# Patient Record
Sex: Female | Born: 1970 | Race: Black or African American | Hispanic: No | Marital: Married | Smoking: Former smoker
Health system: Southern US, Community
[De-identification: ages and names within clinical notes are randomized; demographics above are authoritative.]

## PROBLEM LIST (undated history)

## (undated) ENCOUNTER — Inpatient Hospital Stay (HOSPITAL_COMMUNITY): Payer: Medicaid Other

## (undated) DIAGNOSIS — G43909 Migraine, unspecified, not intractable, without status migrainosus: Secondary | ICD-10-CM

## (undated) DIAGNOSIS — K219 Gastro-esophageal reflux disease without esophagitis: Secondary | ICD-10-CM

## (undated) DIAGNOSIS — G4730 Sleep apnea, unspecified: Secondary | ICD-10-CM

## (undated) HISTORY — DX: Sleep apnea, unspecified: G47.30

## (undated) HISTORY — PX: KNEE SURGERY: SHX244

## (undated) HISTORY — DX: Gastro-esophageal reflux disease without esophagitis: K21.9

## (undated) HISTORY — DX: Migraine, unspecified, not intractable, without status migrainosus: G43.909

---

## 2007-01-30 ENCOUNTER — Emergency Department (HOSPITAL_COMMUNITY): Admission: EM | Admit: 2007-01-30 | Discharge: 2007-01-30 | Payer: Self-pay | Admitting: Emergency Medicine

## 2009-09-26 ENCOUNTER — Emergency Department (HOSPITAL_COMMUNITY): Admission: EM | Admit: 2009-09-26 | Discharge: 2009-09-27 | Payer: Self-pay | Admitting: Emergency Medicine

## 2010-02-13 ENCOUNTER — Emergency Department (HOSPITAL_COMMUNITY): Admission: EM | Admit: 2010-02-13 | Discharge: 2010-02-13 | Payer: Self-pay | Admitting: Emergency Medicine

## 2010-06-11 LAB — POCT PREGNANCY, URINE: Preg Test, Ur: NEGATIVE

## 2010-06-11 LAB — GC/CHLAMYDIA PROBE AMP, GENITAL: GC Probe Amp, Genital: NEGATIVE

## 2010-06-11 LAB — WET PREP, GENITAL: Trich, Wet Prep: NONE SEEN

## 2010-09-25 ENCOUNTER — Emergency Department (HOSPITAL_COMMUNITY)
Admission: EM | Admit: 2010-09-25 | Discharge: 2010-09-26 | Disposition: A | Discharge status: Home / Self Care | Payer: Self-pay | Attending: Emergency Medicine | Admitting: Emergency Medicine

## 2010-09-25 DIAGNOSIS — B9689 Other specified bacterial agents as the cause of diseases classified elsewhere: Secondary | ICD-10-CM | POA: Insufficient documentation

## 2010-09-25 DIAGNOSIS — A499 Bacterial infection, unspecified: Secondary | ICD-10-CM | POA: Insufficient documentation

## 2010-09-25 DIAGNOSIS — N760 Acute vaginitis: Secondary | ICD-10-CM | POA: Insufficient documentation

## 2010-09-25 LAB — URINE MICROSCOPIC-ADD ON

## 2010-09-25 LAB — URINALYSIS, ROUTINE W REFLEX MICROSCOPIC
Glucose, UA: NEGATIVE mg/dL
Protein, ur: NEGATIVE mg/dL

## 2010-09-25 LAB — POCT PREGNANCY, URINE: Preg Test, Ur: NEGATIVE

## 2010-09-26 LAB — WET PREP, GENITAL: Yeast Wet Prep HPF POC: NONE SEEN

## 2010-09-27 LAB — GC/CHLAMYDIA PROBE AMP, GENITAL
Chlamydia, DNA Probe: NEGATIVE
GC Probe Amp, Genital: NEGATIVE

## 2011-01-07 LAB — URINALYSIS, ROUTINE W REFLEX MICROSCOPIC
Bilirubin Urine: NEGATIVE
Glucose, UA: NEGATIVE
Ketones, ur: NEGATIVE
Leukocytes, UA: NEGATIVE
Nitrite: NEGATIVE
Protein, ur: NEGATIVE
Specific Gravity, Urine: 1.027
Urobilinogen, UA: 1
pH: 6

## 2011-01-07 LAB — WET PREP, GENITAL: Yeast Wet Prep HPF POC: NONE SEEN

## 2011-01-07 LAB — URINE MICROSCOPIC-ADD ON

## 2011-01-07 LAB — GC/CHLAMYDIA PROBE AMP, GENITAL: GC Probe Amp, Genital: NEGATIVE

## 2011-01-07 LAB — RPR: RPR Ser Ql: NONREACTIVE

## 2011-01-07 LAB — POCT PREGNANCY, URINE
Operator id: 192351
Preg Test, Ur: NEGATIVE

## 2011-02-16 ENCOUNTER — Emergency Department (HOSPITAL_COMMUNITY): Payer: Self-pay

## 2011-02-16 ENCOUNTER — Encounter: Payer: Self-pay | Admitting: *Deleted

## 2011-02-16 ENCOUNTER — Emergency Department (HOSPITAL_COMMUNITY)
Admission: EM | Admit: 2011-02-16 | Discharge: 2011-02-16 | Disposition: A | Discharge status: Home / Self Care | Payer: Self-pay | Attending: Emergency Medicine | Admitting: Emergency Medicine

## 2011-02-16 DIAGNOSIS — M79609 Pain in unspecified limb: Secondary | ICD-10-CM | POA: Insufficient documentation

## 2011-02-16 DIAGNOSIS — IMO0002 Reserved for concepts with insufficient information to code with codable children: Secondary | ICD-10-CM | POA: Insufficient documentation

## 2011-02-16 DIAGNOSIS — S9032XA Contusion of left foot, initial encounter: Secondary | ICD-10-CM

## 2011-02-16 DIAGNOSIS — S9030XA Contusion of unspecified foot, initial encounter: Secondary | ICD-10-CM | POA: Insufficient documentation

## 2011-02-16 DIAGNOSIS — R269 Unspecified abnormalities of gait and mobility: Secondary | ICD-10-CM | POA: Insufficient documentation

## 2011-02-16 MED ORDER — KETOROLAC TROMETHAMINE 60 MG/2ML IM SOLN
60.0000 mg | Freq: Once | INTRAMUSCULAR | Status: AC
  Administered 2011-02-16: 60 mg via INTRAMUSCULAR
  Filled 2011-02-16: qty 2

## 2011-02-16 MED ORDER — NAPROXEN 500 MG PO TABS: 500.0000 mg | ORAL_TABLET | Freq: Two times a day (BID) | ORAL | Status: DC
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
## 2011-07-01 ENCOUNTER — Encounter (HOSPITAL_COMMUNITY): Payer: Self-pay | Admitting: *Deleted

## 2011-07-01 ENCOUNTER — Emergency Department (HOSPITAL_COMMUNITY)
Admission: EM | Admit: 2011-07-01 | Discharge: 2011-07-01 | Disposition: A | Discharge status: Home / Self Care | Payer: Self-pay | Attending: Emergency Medicine | Admitting: Emergency Medicine

## 2011-07-01 ENCOUNTER — Other Ambulatory Visit: Payer: Self-pay

## 2011-07-01 DIAGNOSIS — R42 Dizziness and giddiness: Secondary | ICD-10-CM | POA: Insufficient documentation

## 2011-07-01 DIAGNOSIS — N390 Urinary tract infection, site not specified: Secondary | ICD-10-CM | POA: Insufficient documentation

## 2011-07-01 DIAGNOSIS — F17200 Nicotine dependence, unspecified, uncomplicated: Secondary | ICD-10-CM | POA: Insufficient documentation

## 2011-07-01 LAB — URINALYSIS, ROUTINE W REFLEX MICROSCOPIC
Bilirubin Urine: NEGATIVE
Glucose, UA: NEGATIVE mg/dL
Ketones, ur: NEGATIVE mg/dL
Leukocytes, UA: NEGATIVE
Nitrite: POSITIVE — AB
Protein, ur: NEGATIVE mg/dL
Specific Gravity, Urine: 1.026 (ref 1.005–1.030)
Urobilinogen, UA: 1 mg/dL (ref 0.0–1.0)
pH: 6 (ref 5.0–8.0)

## 2011-07-01 LAB — URINE MICROSCOPIC-ADD ON

## 2011-07-01 MED ORDER — SULFAMETHOXAZOLE-TRIMETHOPRIM 800-160 MG PO TABS: 1.0000 | ORAL_TABLET | Freq: Two times a day (BID) | ORAL | Status: AC

## 2011-07-01 MED ORDER — SULFAMETHOXAZOLE-TMP DS 800-160 MG PO TABS
1.0000 | ORAL_TABLET | Freq: Once | ORAL | Status: AC
  Administered 2011-07-01: 1 via ORAL
  Filled 2011-07-01: qty 1

## 2011-07-01 NOTE — ED Notes (Signed)
Pt c/o dizziness since Sunday.  No other symptoms except for a bad odor  In her urine.  lmp   2 weeks ago

## 2011-07-01 NOTE — ED Provider Notes (Signed)
History    40yf with dizziness. Vague feeling of that just doesn't feel like normal self. NO fever or chills. NO HA, CP or SOB. No palpitations. Urinary frequency and smells bad. No vag bleeding or discharge. No back pain. No n/v/d. Denies drug use. Smoker. Denies new meds or otcs.  CSN: [id number]  Arrival date & time 07/01/11  2057   First MD Initiated Contact with Patient 07/01/11 2302      Chief Complaint  Patient presents with  . Dizziness    (Consider location/radiation/quality/duration/timing/severity/associated sxs/prior treatment) HPI  History reviewed. No pertinent past medical history.  Past Surgical History  Procedure Date  . Knee surgery     No family history on file.  History  Substance Use Topics  . Smoking status: Current Everyday Smoker  . Smokeless tobacco: Not on file  . Alcohol Use: Yes     occaisional     OB History    Grav Para Term Preterm Abortions TAB SAB Ect Mult Living                  Review of Systems   Review of symptoms negative unless otherwise noted in HPI.   Allergies  Review of patient's allergies indicates no known allergies.  Home Medications   Current Outpatient Rx  Name Route Sig Dispense Refill  . ASPIRIN 325 MG PO TABS Oral Take 650 mg by mouth every 6 (six) hours as needed. For pain    . IBUPROFEN 200 MG PO TABS Oral Take 400 mg by mouth every 6 (six) hours as needed. For pain    . SULFAMETHOXAZOLE-TRIMETHOPRIM 800-160 MG PO TABS Oral Take 1 tablet by mouth every 12 (twelve) hours. 8 tablet 0    BP 115/71  Pulse 88  Temp(Src) 97.9 F (36.6 C) (Oral)  Resp 18  SpO2 99%  LMP 06/17/2011  Physical Exam  Nursing note and vitals reviewed. Constitutional: She appears well-developed and well-nourished. No distress.       Sitting up in bed. nad.  HENT:  Head: Normocephalic and atraumatic.  Eyes: Conjunctivae are normal. Right eye exhibits no discharge. Left eye exhibits no discharge.  Neck: Neck supple.    Cardiovascular: Normal rate, regular rhythm and normal heart sounds.  Exam reveals no gallop and no friction rub.   No murmur heard. Pulmonary/Chest: Effort normal and breath sounds normal. No respiratory distress.  Abdominal: Soft. She exhibits no distension. There is no tenderness.  Genitourinary:       No cva tenderness.  Musculoskeletal: She exhibits no edema and no tenderness.  Neurological: She is alert.  Skin: Skin is warm and dry.  Psychiatric: She has a normal mood and affect. Her behavior is normal. Thought content normal.    ED Course  Procedures (including critical care time)  Labs Reviewed  URINALYSIS, ROUTINE W REFLEX MICROSCOPIC - Abnormal; Notable for the following:    APPearance CLOUDY (*)    Hgb urine dipstick MODERATE (*)    Nitrite POSITIVE (*)    All other components within normal limits  URINE MICROSCOPIC-ADD ON - Abnormal; Notable for the following:    Squamous Epithelial / LPF FEW (*)    Bacteria, UA MANY (*)    All other components within normal limits  POCT PREGNANCY, URINE   No results found.  EKG:  Rhythm: Normal sinus Rate: 84 Axis: Normal axis Intervals: Poor R wave progression ST segments: Nonspecific ST changes. There is T-wave flattening anteriorly. Comparison: None provided  1. Dizziness  2. UTI (urinary tract infection)       MDM  40yf with dizziness. UA consistent with UTI and suspect symptoms related to that. Afebrile, normal HR and well appearing. Doubt urosepsis. Feel appropriate for outpt tx. EKG with normal intervals and no ischemic changes. Return precautions discussed.        Raeford Razor, MD 07/03/11 2337

## 2011-07-01 NOTE — Discharge Instructions (Signed)
Dizziness  Dizziness is a common problem. It is a feeling of unsteadiness or lightheadedness. You may feel like you are about to faint. Dizziness can lead to injury if you stumble or fall. A person of any age group can suffer from dizziness, but dizziness is more common in older adults.  CAUSES   Dizziness can be caused by many different things, including:  · Middle ear problems.  · Standing for too long.  · Infections.  · An allergic reaction.  · Aging.  · An emotional response to something, such as the sight of blood.  · Side effects of medicines.  · Fatigue.  · Problems with circulation or blood pressure.  · Excess use of alcohol, medicines, or illegal drug use.  · Breathing too fast (hyperventilation).  · An arrhythmia or problems with your heart rhythm.  · Low red blood cell count (anemia).  · Pregnancy.  · Vomiting, diarrhea, fever, or other illnesses that cause dehydration.  · Diseases or conditions such as Parkinson's disease, high blood pressure (hypertension), diabetes, and thyroid problems.  · Exposure to extreme heat.  DIAGNOSIS   To find the cause of your dizziness, your caregiver may do a physical exam, lab tests, radiologic imaging scans, or an electrocardiography test (ECG).   TREATMENT   Treatment of dizziness depends on the cause of your symptoms and can vary greatly.  HOME CARE INSTRUCTIONS   · Drink enough fluids to keep your urine clear or pale yellow. This is especially important in very hot weather. In the elderly, it is also important in cold weather.  · If your dizziness is caused by medicines, take them exactly as directed. When taking blood pressure medicines, it is especially important to get up slowly.  · Rise slowly from chairs and steady yourself until you feel okay.  · In the morning, first sit up on the side of the bed. When this seems okay, stand slowly while holding onto something until you know your balance is fine.  · If you need to stand in one place for a long time, be sure to  move your legs often. Tighten and relax the muscles in your legs while standing.  · If dizziness continues to be a problem, have someone stay with you for a day or two. Do this until you feel you are well enough to stay alone. Have the person call your caregiver if he or she notices changes in you that are concerning.  · Do not drive or use heavy machinery if you feel dizzy.  SEEK IMMEDIATE MEDICAL CARE IF:   · Your dizziness or lightheadedness gets worse.  · You feel nauseous or vomit.  · You develop problems with talking, walking, weakness, or using your arms, hands, or legs.  · You are not thinking clearly or you have difficulty forming sentences. It may take a friend or family member to determine if your thinking is normal.  · You develop chest pain, abdominal pain, shortness of breath, or sweating.  · Your vision changes.  · You notice any bleeding.  · You have side effects from medicine that seems to be getting worse rather than better.  MAKE SURE YOU:   · Understand these instructions.  · Will watch your condition.  · Will get help right away if you are not doing well or get worse.  Document Released: 09/10/2000 Document Revised: 03/06/2011 Document Reviewed: 10/04/2010  ExitCare® Patient Information ©2012 [employer].  Urinary Tract Infection  Infections of the   and a prostate infection (prostatitis) are different types of urinary tract infections (UTIs). They usually get better if treated with medicines (antibiotics) that kill germs. Take all the medicine until it is gone. You or your child may feel better in a few days, but TAKE ALL MEDICINE or the infection may not respond and may become more difficult to treat. HOME CARE INSTRUCTIONS   Drink enough water and fluids to keep the urine clear or pale yellow. Cranberry juice is especially recommended, in  addition to large amounts of water.   Avoid caffeine, tea, and carbonated beverages. They tend to irritate the bladder.   Alcohol may irritate the prostate.   Only take over-the-counter or prescription medicines for pain, discomfort, or fever as directed by your caregiver.  To prevent further infections:  Empty the bladder often. Avoid holding urine for long periods of time.   After a bowel movement, women should cleanse from front to back. Use each tissue only once.   Empty the bladder before and after sexual intercourse.  FINDING OUT THE RESULTS OF YOUR TEST Not all test results are available during your visit. If your or your child's test results are not back during the visit, make an appointment with your caregiver to find out the results. Do not assume everything is normal if you have not heard from your caregiver or the medical facility. It is important for you to follow up on all test results. SEEK MEDICAL CARE IF:   There is back pain.   Your baby is older than 3 months with a rectal temperature of 100.5 F (38.1 C) or higher for more than 1 day.   Your or your child's problems (symptoms) are no better in 3 days. Return sooner if you or your child is getting worse.  SEEK IMMEDIATE MEDICAL CARE IF:   There is severe back pain or lower abdominal pain.   You or your child develops chills.   You have a fever.   Your baby is older than 3 months with a rectal temperature of 102 F (38.9 C) or higher.   Your baby is 35 months old or younger with a rectal temperature of 100.4 F (38 C) or higher.   There is nausea or vomiting.   There is continued burning or discomfort with urination.  MAKE SURE YOU:   Understand these instructions.   Will watch your condition.   Will get help right away if you are not doing well or get worse.  Document Released: 12/25/2004 Document Revised: 03/06/2011 Document Reviewed: 07/30/2006 [facility] Patient Information 2012 [place],  [place].  RESOURCE GUIDE  Dental Problems  Patients with Medicaid: [facility] [phone] [address]                                           [phone] [address] Phone:  [phone]                                                  Phone:  [phone]  If unable to pay or uninsured, contact:  [facility] or [facility].  to become qualified for the adult dental clinic.  Chronic Pain Problems Contact [facility]  [phone] Patients need to be referred by their primary care doctor.  Insufficient Money for Medicine Contact [employer]:  call "211" or [facility] 9495799765.  No Primary Care Doctor Call [facility]  [phone] Other agencies that provide inexpensive medical care    [facility]  [phone]    [facility]  [phone]    [facility]  [phone]    [facility]  [phone]    [facility]  601-194-1859    [facility]  [phone]  Psychological Services [facility]  [phone] [facility] Services  [phone] [facility]   [phone] (emergency services [phone])  Substance Abuse Resources [facility]  [phone] [facility] 563-386-2538 [facility] [place] [phone] Floydene Flock [phone] Residential & Outpatient Substance Abuse Program  440-635-3033  Abuse/Neglect [facility] Child Abuse Hotline (670)022-1854 [facility] Child Abuse Hotline 479-573-1582 (After Hours)  Emergency Shelter [facility] 319-649-4306  Maternity Homes [facility] [place] [facility] (619)273-4114 [facility] [phone]  MRSA Hotline #:   571-012-3837    [facility] Resources  [facility]     [employer]                          [facility] [address] [place]                       [address]      [address]  Blondell Reveal Phone:  [phone]                                   Phone:  [phone]                 Phone:  [phone]  [facility] Phone:  [phone]  [facility] Child Abuse Hotline 5485472366 [phone] (After Hours)

## 2011-07-01 NOTE — ED Notes (Signed)
Pt denies any pain or questions upon discharge. 

## 2011-07-01 NOTE — ED Notes (Signed)
Pt st's she has felt dizzy off and on since Sun.  St's she also has noticed that her urine has a foul smell.  Pt denies any vag. Discharge or pain.  Pt alert and oriented x's 3.  Talking on cell phone.

## 2011-10-18 ENCOUNTER — Encounter (HOSPITAL_COMMUNITY): Payer: Self-pay | Admitting: Emergency Medicine

## 2011-10-18 ENCOUNTER — Emergency Department (HOSPITAL_COMMUNITY)
Admission: EM | Admit: 2011-10-18 | Discharge: 2011-10-19 | Disposition: A | Discharge status: Home / Self Care | Payer: Self-pay | Attending: Emergency Medicine | Admitting: Emergency Medicine

## 2011-10-18 DIAGNOSIS — F17200 Nicotine dependence, unspecified, uncomplicated: Secondary | ICD-10-CM | POA: Insufficient documentation

## 2011-10-18 DIAGNOSIS — M1990 Unspecified osteoarthritis, unspecified site: Secondary | ICD-10-CM

## 2011-10-18 DIAGNOSIS — M19049 Primary osteoarthritis, unspecified hand: Secondary | ICD-10-CM | POA: Insufficient documentation

## 2011-10-18 NOTE — ED Notes (Signed)
Patient complaining of right index finger pain for the past three months; patient states that she fell three years ago and injured her finger.  Denies any recent injury to finger.  Patient able to move digits freely without difficulty.

## 2011-10-19 ENCOUNTER — Emergency Department (HOSPITAL_COMMUNITY): Payer: Self-pay

## 2011-10-19 MED ORDER — NAPROXEN 500 MG PO TABS: 500.0000 mg | ORAL_TABLET | Freq: Two times a day (BID) | ORAL | Status: DC

## 2011-10-19 NOTE — ED Provider Notes (Signed)
History     CSN: [id number]  Arrival date & time 10/18/11  2209   First MD Initiated Contact with Patient 10/19/11 0022      Chief Complaint  Patient presents with  . Hand Pain  . Finger Injury   HPI  History provided by the patient. Patient is a 41 year old female who presents with complaints of right index finger pain. Patient states that she fell and injured her finger several years ago and at that time had some swelling but reports this improved with rest and ice. Since that time she has occasional pain to the PIP joint. This has especially seems worse over the past 3 months. Pain is also aggravated with long periods of writing with pen or pencil. Patient has tried use ice and rest again this has not seemed to help significantly. She has not taken any medications for symptoms. She denies any other aggravating or alleviating factors. She denies any decreased range of motion or change in sensation.    History reviewed. No pertinent past medical history.  Past Surgical History  Procedure Date  . Knee surgery     History reviewed. No pertinent family history.  History  Substance Use Topics  . Smoking status: Current Everyday Smoker  . Smokeless tobacco: Not on file  . Alcohol Use: Yes     occaisional     OB History    Grav Para Term Preterm Abortions TAB SAB Ect Mult Living                  Review of Systems  Musculoskeletal:       Pain and swelling of right second PIP joint of hand  Skin: Negative for rash.       No erythema  Neurological: Negative for weakness and numbness.    Allergies  Review of patient's allergies indicates no known allergies.  Home Medications  No current outpatient prescriptions on file.  BP 117/70  Pulse 95  Temp 98.8 F (37.1 C) (Oral)  Resp 18  SpO2 98%  LMP 09/28/2011  Physical Exam  Nursing note and vitals reviewed. Constitutional: She is oriented to person, place, and time. She appears well-developed and well-nourished.  No distress.  HENT:  Head: Normocephalic.  Cardiovascular: Normal rate and regular rhythm.   Pulmonary/Chest: Effort normal and breath sounds normal.  Musculoskeletal:       Tenderness palpation over right second PIP joint of hand. Full range of motion. Normal strength with flexion and extension. Normal strength with abduction and adduction. Normal medial and lateral sensations. Normal cap refill less than 2 seconds. No pain over the distal phalanx. No gross deformities or swelling.  Neurological: She is alert and oriented to person, place, and time.  Skin: Skin is warm and dry. No rash noted. No erythema.  Psychiatric: She has a normal mood and affect. Her behavior is normal.    ED Course  Procedures   Dg Finger Index Right  10/19/2011  *RADIOLOGY REPORT*  Clinical Data: Finger injury and pain.  RIGHT INDEX FINGER 2+V  Comparison: None.  Findings: Linear defect in the distal phalanx of the right second fingers suggest nondisplaced phalangeal tuft fracture.  Right second finger appears otherwise intact.  Mild distal soft tissue swelling.  No focal bone lesion or bone destruction.  No abnormal radiopaque soft tissue densities.  IMPRESSION: Suggestion of nondisplaced distal phalangeal tuft fracture of the right second finger.  Original Report Authenticated By: Marlon Pel, M.D.     1.  Arthritis       MDM  Patient seen and evaluated x-rays without signs for acute injury. Patient has no pain over the distal phalanx. Pain is located at the PIP joint. At this time suspect arthritis symptoms of the joint. Patient instructed on diagnosis and treatment plan and agrees with this.        Angus Seller, [place] 10/19/11 [phone]

## 2011-10-19 NOTE — ED Provider Notes (Signed)
Medical screening examination/treatment/procedure(s) were performed by non-physician practitioner and as supervising physician I was immediately available for consultation/collaboration.  Bresha Hosack, MD 10/19/11 0723 

## 2012-03-30 ENCOUNTER — Emergency Department (HOSPITAL_COMMUNITY)
Admission: EM | Admit: 2012-03-30 | Discharge: 2012-03-30 | Disposition: A | Discharge status: Home / Self Care | Payer: Self-pay | Attending: Emergency Medicine | Admitting: Emergency Medicine

## 2012-03-30 ENCOUNTER — Emergency Department (HOSPITAL_COMMUNITY): Payer: Self-pay

## 2012-03-30 ENCOUNTER — Encounter (HOSPITAL_COMMUNITY): Payer: Self-pay | Admitting: Emergency Medicine

## 2012-03-30 DIAGNOSIS — F17200 Nicotine dependence, unspecified, uncomplicated: Secondary | ICD-10-CM | POA: Insufficient documentation

## 2012-03-30 DIAGNOSIS — Z3202 Encounter for pregnancy test, result negative: Secondary | ICD-10-CM | POA: Insufficient documentation

## 2012-03-30 DIAGNOSIS — M25732 Osteophyte, left wrist: Secondary | ICD-10-CM

## 2012-03-30 DIAGNOSIS — N898 Other specified noninflammatory disorders of vagina: Secondary | ICD-10-CM | POA: Insufficient documentation

## 2012-03-30 DIAGNOSIS — M778 Other enthesopathies, not elsewhere classified: Secondary | ICD-10-CM | POA: Insufficient documentation

## 2012-03-30 LAB — URINALYSIS, ROUTINE W REFLEX MICROSCOPIC
Bilirubin Urine: NEGATIVE
Glucose, UA: NEGATIVE mg/dL
Hgb urine dipstick: NEGATIVE
Ketones, ur: NEGATIVE mg/dL
Nitrite: NEGATIVE
Specific Gravity, Urine: 1.025 (ref 1.005–1.030)
pH: 5.5 (ref 5.0–8.0)

## 2012-03-30 LAB — URINE MICROSCOPIC-ADD ON

## 2012-03-30 LAB — WET PREP, GENITAL
WBC, Wet Prep HPF POC: NONE SEEN
Yeast Wet Prep HPF POC: NONE SEEN

## 2012-03-30 MED ORDER — CEFTRIAXONE SODIUM 250 MG IJ SOLR
250.0000 mg | Freq: Once | INTRAMUSCULAR | Status: AC
  Administered 2012-03-30: 250 mg via INTRAMUSCULAR
  Filled 2012-03-30: qty 250

## 2012-03-30 MED ORDER — IBUPROFEN 800 MG PO TABS: 800.0000 mg | ORAL_TABLET | Freq: Three times a day (TID) | ORAL | Status: DC

## 2012-03-30 MED ORDER — KETOROLAC TROMETHAMINE 60 MG/2ML IM SOLN
60.0000 mg | Freq: Once | INTRAMUSCULAR | Status: AC
  Administered 2012-03-30: 60 mg via INTRAMUSCULAR
  Filled 2012-03-30: qty 2

## 2012-03-30 MED ORDER — FLUCONAZOLE 100 MG PO TABS
150.0000 mg | ORAL_TABLET | Freq: Once | ORAL | Status: AC
  Administered 2012-03-30: 150 mg via ORAL
  Filled 2012-03-30: qty 2

## 2012-03-30 MED ORDER — AZITHROMYCIN 250 MG PO TABS
1000.0000 mg | ORAL_TABLET | Freq: Once | ORAL | Status: AC
  Administered 2012-03-30: 1000 mg via ORAL
  Filled 2012-03-30: qty 4

## 2012-03-30 NOTE — ED Notes (Signed)
Patient transported to X-ray 

## 2012-03-30 NOTE — Progress Notes (Signed)
Orthopedic Tech Progress Note Patient Details:  Wendy Howard Dec 18, 1970 [id number]  Ortho Devices Type of Ortho Device: Wrist splint Ortho Device/Splint Location: LEFT WRIST SPLINT Ortho Device/Splint Interventions: Application   Cammer, Mickie Bail 03/30/2012, 4:37 AM

## 2012-03-30 NOTE — ED Provider Notes (Signed)
History     CSN: [id number]  Arrival date & time 03/30/12  0033   First MD Initiated Contact with Patient 03/30/12 [phone]      Chief Complaint  Patient presents with  . Vaginal Discharge    (Consider location/radiation/quality/duration/timing/severity/associated sxs/prior treatment) HPI History provided by patient. She noticed over the last few days last few days a bump on her left wrist, is sore to touch. No injury. No redness or fevers. 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 No history of ganglion cyst. No weakness or numbness.  She also has vaginal itching and discharge. This has been going on for about a week. No vaginal bleeding. No back pain. No nausea vomiting or diarrhea. No abdominal pain. Symptoms moderate in severity. Has history of gonorrhea and is sexually active. No rash or lesions.  History reviewed. No pertinent past medical history.  Past Surgical History  Procedure Date  . Knee surgery   . Knee surgery     No family history on file.  History  Substance Use Topics  . Smoking status: Current Every Day Smoker  . Smokeless tobacco: Not on file  . Alcohol Use: Yes     Comment: occaisional     OB History    Grav Para Term Preterm Abortions TAB SAB Ect Mult Living                  Review of Systems  Constitutional: Negative for fever and chills.  HENT: Negative for neck pain and neck stiffness.   Eyes: Negative for pain.  Respiratory: Negative for shortness of breath.   Cardiovascular: Negative for chest pain.  Gastrointestinal: Negative for abdominal pain.  Genitourinary: Positive for vaginal discharge. Negative for dysuria, genital sores and pelvic pain.  Musculoskeletal: Negative for back pain.  Skin: Negative for rash.  Neurological: Negative for headaches.  All other systems reviewed and are negative.    Allergies  Other  Home Medications   Current Outpatient Rx  Name  Route  Sig  Dispense  Refill  .  IBUPROFEN 200 MG PO TABS   Oral   Take 800 mg by mouth every 6 (six) hours as needed.           BP 120/73  Pulse 95  Temp 99 F (37.2 C) (Oral)  Resp 14  SpO2 100%  LMP 03/06/2012  Physical Exam  Constitutional: She is oriented to person, place, and time. She appears well-developed and well-nourished.  HENT:  Head: Normocephalic and atraumatic.  Eyes: EOM are normal. Pupils are equal, round, and reactive to light.  Neck: Neck supple.  Cardiovascular: Regular rhythm and intact distal pulses.   Pulmonary/Chest: Effort normal. No respiratory distress.  Abdominal: Soft. Bowel sounds are normal. She exhibits no distension. There is no tenderness.  Genitourinary: Vagina normal.       No adnexal or cervical motion tenderness. Moderate white discharge. No GU lesions or rash  Musculoskeletal: Normal range of motion. She exhibits no edema.        Left dorsal wrist with small area of dorsal swelling. Mildly tender to palpation. No fluctuance. Full range of motion throughout with distal neurovascular intact. Right wrist with similar lesion.  Neurological: She is alert and oriented to person, place, and time.  Skin: Skin is warm and dry.    ED Course  Procedures (including critical care time)  Results for orders placed during the hospital encounter of 03/30/12  URINALYSIS, ROUTINE  W REFLEX MICROSCOPIC      Component Value Range   Color, Urine YELLOW  YELLOW   APPearance CLOUDY (*) CLEAR   Specific Gravity, Urine 1.025  1.005 - 1.030   pH 5.5  5.0 - 8.0   Glucose, UA NEGATIVE  NEGATIVE mg/dL   Hgb urine dipstick NEGATIVE  NEGATIVE   Bilirubin Urine NEGATIVE  NEGATIVE   Ketones, ur NEGATIVE  NEGATIVE mg/dL   Protein, ur NEGATIVE  NEGATIVE mg/dL   Urobilinogen, UA 0.2  0.0 - 1.0 mg/dL   Nitrite NEGATIVE  NEGATIVE   Leukocytes, UA SMALL (*) NEGATIVE  POCT PREGNANCY, URINE      Component Value Range   Preg Test, Ur NEGATIVE  NEGATIVE  URINE MICROSCOPIC-ADD ON      Component Value  Range   Squamous Epithelial / LPF FEW (*) RARE   WBC, UA 3-6  <3 WBC/hpf   RBC / HPF 0-2  <3 RBC/hpf   Bacteria, UA RARE  RARE   Urine-Other MUCOUS PRESENT    WET PREP, GENITAL      Component Value Range   Yeast Wet Prep HPF POC NONE SEEN  NONE SEEN   Trich, Wet Prep NONE SEEN  NONE SEEN   Clue Cells Wet Prep HPF POC RARE (*) NONE SEEN   WBC, Wet Prep HPF POC NONE SEEN  NONE SEEN   Dg Wrist Complete Left  03/30/2012  *RADIOLOGY REPORT*  Clinical Data: Painful knot on posterior left wrist for 1 week.  LEFT WRIST - COMPLETE 3+ VIEW  Comparison: Left hand radiographs performed 09/26/2009  Findings: There is no evidence of fracture or dislocation.  The carpal rows are intact, and demonstrate normal alignment.  The joint spaces are preserved.  There is mild osteophyte formation at the distal ulna.  Mild soft tissue swelling is suggested at the dorsal aspect of the wrist.  IMPRESSION: No evidence of fracture or dislocation.   Original Report Authenticated By: Tonia Ghent, M.D.     IM Toradol, Rocephin provided. Azithromycin.  On recheck symptoms improving. Plan discharge with hand referral and referral to the health department for followup culture results.   MDM   Vaginal discharge and itching - given Diflucan. Has history of STD and treated for the same. Cultures pending  Left wrist imaging reviewed as above - Hand referral  Vital signs and nursing notes reviewed. Improved with pain medications as above      Sunnie Nielsen, MD 03/30/12 [phone]

## 2012-03-30 NOTE — ED Notes (Signed)
PT. REPORTS VAGINAL DISCHARGE /ITCHING FOR 1 WEEK , DENIES DYSURIA , ALSO REPORTS "KNOT" AT LEFT POSTERIOR WRIST DENIES INJURY.

## 2012-03-31 LAB — GC/CHLAMYDIA PROBE AMP: CT Probe RNA: NEGATIVE

## 2012-07-25 ENCOUNTER — Encounter (HOSPITAL_COMMUNITY): Payer: Self-pay | Admitting: Emergency Medicine

## 2012-07-25 ENCOUNTER — Emergency Department (HOSPITAL_COMMUNITY)
Admission: EM | Admit: 2012-07-25 | Discharge: 2012-07-25 | Disposition: A | Discharge status: Home Health | Payer: No Typology Code available for payment source | Attending: Emergency Medicine | Admitting: Emergency Medicine

## 2012-07-25 DIAGNOSIS — J020 Streptococcal pharyngitis: Secondary | ICD-10-CM | POA: Insufficient documentation

## 2012-07-25 DIAGNOSIS — F17200 Nicotine dependence, unspecified, uncomplicated: Secondary | ICD-10-CM | POA: Insufficient documentation

## 2012-07-25 DIAGNOSIS — R490 Dysphonia: Secondary | ICD-10-CM | POA: Insufficient documentation

## 2012-07-25 DIAGNOSIS — H9209 Otalgia, unspecified ear: Secondary | ICD-10-CM | POA: Insufficient documentation

## 2012-07-25 LAB — RAPID STREP SCREEN (MED CTR MEBANE ONLY): Streptococcus, Group A Screen (Direct): POSITIVE — AB

## 2012-07-25 MED ORDER — DEXAMETHASONE SODIUM PHOSPHATE 10 MG/ML IJ SOLN
10.0000 mg | Freq: Once | INTRAMUSCULAR | Status: AC
  Administered 2012-07-25: 10 mg via INTRAMUSCULAR
  Filled 2012-07-25: qty 1

## 2012-07-25 MED ORDER — LIDOCAINE VISCOUS 2 % MT SOLN: 20.0000 mL | OROMUCOSAL | Status: DC | PRN

## 2012-07-25 MED ORDER — AMOXICILLIN 500 MG PO CAPS: 500.0000 mg | ORAL_CAPSULE | Freq: Three times a day (TID) | ORAL | Status: DC

## 2012-07-25 MED ORDER — KETOROLAC TROMETHAMINE 60 MG/2ML IM SOLN
60.0000 mg | Freq: Once | INTRAMUSCULAR | Status: AC
  Administered 2012-07-25: 60 mg via INTRAMUSCULAR
  Filled 2012-07-25: qty 2

## 2012-07-25 NOTE — ED Notes (Signed)
Pt discharged to home with family. NAD.  

## 2012-07-25 NOTE — ED Provider Notes (Signed)
History     CSN: [id number]  Arrival date & time 07/25/12  5621   First MD Initiated Contact with Patient 07/25/12 [phone]      Chief Complaint  Patient presents with  . Sore Throat    (Consider location/radiation/quality/duration/timing/severity/associated sxs/prior treatment) HPI  42 y.o. female with sore throat, myalgias, swollen glands,  Pain with swalloning for 2 days. History of previous strep infections Other symptoms: sore throat, swollen glands, white spots in throat, hoarseness, enlarged tonsils and left sided otalgia. Denies fevers, chills. Denies DOE, SOB, chest tightness or pressure, radiation to left arm, jaw or back, or diaphoresis. Denies dysuria, flank pain, suprapubic pain, frequency, urgency, or hematuria. Denies headaches, light headedness, weakness, visual disturbances. Denies abdominal pain, nausea, vomiting, diarrhea or constipation. Denies difficulty difficulty breathing or      History reviewed. No pertinent past medical history.  Past Surgical History  Procedure Laterality Date  . Knee surgery    . Knee surgery    . Knee surgery      History reviewed. No pertinent family history.  History  Substance Use Topics  . Smoking status: Current Every Day Smoker    Types: Cigarettes  . Smokeless tobacco: Not on file  . Alcohol Use: Yes     Comment: occaisional     OB History   Grav Para Term Preterm Abortions TAB SAB Ect Mult Living                  Review of Systems As stated in HPI.  Allergies  Other  Home Medications   Current Outpatient Rx  Name  Route  Sig  Dispense  Refill  . ibuprofen (ADVIL,MOTRIN) 200 MG tablet   Oral   Take 800 mg by mouth every 6 (six) hours as needed.         Marland Kitchen ibuprofen (ADVIL,MOTRIN) 800 MG tablet   Oral   Take 1 tablet (800 mg total) by mouth 3 (three) times daily.   21 tablet   0     BP 121/78  Pulse 86  Temp(Src) 98.7 F (37.1 C) (Oral)  Resp 14  Ht 5\' 2"  (1.575 m)  Wt 145 lb (65.772 kg)   BMI 26.51 kg/m2  SpO2 99%  Physical Exam Vitals as noted above. Appears appears glassy eyed and ill. Ears: bilateral TM's and external ear canals normal Oropharynx: erythematous, exudate noted, tonsils hypertrophied with exudate, dental hygiene good, tongue normal and Uvula midline but edematous. Neck: bilateral symmetric anterior adenopathy, TTP Lungs: clear to auscultation, no wheezes, rales or rhonchi, symmetric air entry Abd: soft, non tender, non-distended. BS normal   ED Course  Procedures (including critical care time)  Labs Reviewed  RAPID STREP SCREEN - Abnormal; Notable for the following:    Streptococcus, Group A Screen (Direct) POSITIVE (*)    All other components within normal limits   No results found.   1. Strep pharyngitis       MDM  8:19 AM Patient with edematous, erythematous throat and exudates. Airway is patent. I have ordered dexamethasone and Toradol. Patient is able to tolerate liquids. Rapid strep is pending.   9:11 AM Pt febrile with tonsillar exudate, cervical lymphadenopathy, & dysphagia; diagnosis of strep. Treated in the Ed with steroids, NSAIDs, Pain medication and PCN IM.  Pt appears mildly dehydrated, discussed importance of water rehydration. Presentation non concerning for PTA or infxn spread to soft tissue. No trismus or uvula deviation. Specific return precautions discussed. Pt able to drink water in  ED without difficulty with intact air way. Recommended PCP follow up.          Arthor Captain, PA-C 07/25/12 [phone]

## 2012-07-25 NOTE — ED Provider Notes (Signed)
Medical screening examination/treatment/procedure(s) were performed by non-physician practitioner and as supervising physician I was immediately available for consultation/collaboration.  Juliet Rude. Rubin Payor, MD 07/25/12 [phone]

## 2012-07-25 NOTE — ED Notes (Signed)
Pt presents to ED vie POV with c/o sore throat that started yesterday. Pt reports pain when swallowing. NAD.

## 2012-08-03 ENCOUNTER — Emergency Department (HOSPITAL_COMMUNITY)
Admission: EM | Admit: 2012-08-03 | Discharge: 2012-08-03 | Disposition: A | Discharge status: Home / Self Care | Payer: No Typology Code available for payment source | Attending: Emergency Medicine | Admitting: Emergency Medicine

## 2012-08-03 DIAGNOSIS — F17200 Nicotine dependence, unspecified, uncomplicated: Secondary | ICD-10-CM | POA: Insufficient documentation

## 2012-08-03 DIAGNOSIS — B373 Candidiasis of vulva and vagina: Secondary | ICD-10-CM | POA: Insufficient documentation

## 2012-08-03 DIAGNOSIS — B3731 Acute candidiasis of vulva and vagina: Secondary | ICD-10-CM | POA: Insufficient documentation

## 2012-08-03 DIAGNOSIS — N898 Other specified noninflammatory disorders of vagina: Secondary | ICD-10-CM | POA: Insufficient documentation

## 2012-08-03 LAB — WET PREP, GENITAL

## 2012-08-03 MED ORDER — FLUCONAZOLE 150 MG PO TABS: ORAL_TABLET | ORAL | Status: DC

## 2012-08-03 NOTE — ED Notes (Signed)
Per pt she finished a dose of Penicillin for Strep throat on 07/30/2012 and yesterday she reports having severe vaginal itching but no discharge.  Denies pain with urination.

## 2012-08-03 NOTE — ED Provider Notes (Signed)
History     CSN: [id number]  Arrival date & time 08/03/12  0744   First MD Initiated Contact with Patient 08/03/12 0802      Chief Complaint  Patient presents with  . Vaginal Itching    (Consider location/radiation/quality/duration/timing/severity/associated sxs/prior treatment) HPI Comments: Pt is a 42 y.o. Female with 1 day of vaginal discharge.  Just finished course of ABX for Strep Pharyngitis.  Reported vaginal itching that is constant starting yesterday.  Worse following urination but no dysuria.  No systemic symptoms.  Does report having recurrent yeast infections with prior antibiotic use but has been multiple years.  Patient is a 42 y.o. female presenting with vaginal itching. The history is provided by the patient.  Vaginal Itching This is a new problem. The current episode started yesterday. The problem occurs constantly. The problem has been unchanged. Pertinent negatives include no abdominal pain, anorexia, chest pain, chills, coughing, fatigue, fever, headaches, nausea, urinary symptoms (increase itching following urination but no dysuria) or vomiting. Exacerbated by: wiping following urination. She has tried nothing for the symptoms.    No past medical history on file.  Past Surgical History  Procedure Laterality Date  . Knee surgery    . Knee surgery    . Knee surgery      No family history on file.  History  Substance Use Topics  . Smoking status: Current Every Day Smoker    Types: Cigarettes  . Smokeless tobacco: Not on file  . Alcohol Use: Yes     Comment: occaisional     OB History   Grav Para Term Preterm Abortions TAB SAB Ect Mult Living                  Review of Systems  Constitutional: Negative for fever, chills and fatigue.  Eyes: Negative.   Respiratory: Negative.  Negative for cough, shortness of breath and wheezing.        Current one pack per day smoker.  Cardiovascular: Negative for chest pain and palpitations.  Gastrointestinal:  Negative for nausea, vomiting, abdominal pain and anorexia.  Endocrine: Negative.   Genitourinary: Negative for dysuria, urgency, frequency, flank pain, decreased urine volume, vaginal bleeding, vaginal discharge, difficulty urinating, vaginal pain, menstrual problem (LMP approximately 2 weeks ago) and pelvic pain.       Does report unprotected intercourse with multiple partners.  No history of STI  Musculoskeletal: Negative.   Skin: Negative.   Allergic/Immunologic: Negative.   Neurological: Negative for headaches.  Hematological: Negative.   Psychiatric/Behavioral: Negative.     Allergies  Other  Home Medications   Current Outpatient Rx  Name  Route  Sig  Dispense  Refill  . fluconazole (DIFLUCAN) 150 MG tablet      Take 1 tablet (150mg ) today.  Take an additional 1 tablet (150mg ) in 3 days   2 tablet   0     BP 123/76  Temp(Src) 98.6 F (37 C) (Oral)  Resp 18  SpO2 99%  LMP 07/20/2012  Physical Exam  Nursing note and vitals reviewed. Constitutional: She appears well-developed and well-nourished. No distress.     HENT:  Head: Normocephalic and atraumatic.  Eyes: Conjunctivae are normal. Right eye exhibits no discharge. Left eye exhibits no discharge. No scleral icterus.  Cardiovascular: Normal rate, regular rhythm and normal heart sounds.   Was tachycardic on initial assessment following ambulation to room.  Pulmonary/Chest: Effort normal and breath sounds normal. No respiratory distress. She has no wheezes. She has no rales.  Abdominal: Soft. She exhibits no distension. There is no tenderness. There is no rebound and no guarding.  Genitourinary: Uterus is not deviated, not enlarged and not tender. Cervix exhibits no motion tenderness, no discharge and no friability. Right adnexum displays no mass, no tenderness and no fullness. Left adnexum displays no mass, no tenderness and no fullness. No tenderness around the vagina. Vaginal discharge (White curdy) found.   Musculoskeletal: Normal range of motion. She exhibits no edema.  Neurological: She is alert. She exhibits normal muscle tone.  Skin: Skin is warm and dry. No rash noted. She is not diaphoretic. No erythema. No pallor.  Psychiatric: She has a normal mood and affect. Her behavior is normal. Judgment and thought content normal.    ED Course  Procedures (including critical care time)  Labs Reviewed  WET PREP, GENITAL - Abnormal; Notable for the following:    Yeast Wet Prep HPF POC FEW (*)    Clue Cells Wet Prep HPF POC FEW (*)    WBC, Wet Prep HPF POC FEW (*)    All other components within normal limits  GC/CHLAMYDIA PROBE AMP   No results found.   1. Yeast vaginitis       MDM  8:32 AM - patient seen and examined.  History consistent with yeast infection and evidence on external pelvic exam of discharge.  Does have white curdy discharge with internal pelvic exam.  Wet prep, GC/chlamydia (due risk factors) collected.  Will plan to treat with Diflucan 150 mg by mouth x2, 1 now one in 3 days.  Patient has been encouraged to followup with PCP to establish primary care as well as counseled on smoking cessation.  Andrena Mews, DO [facility] Resident - PGY-2 08/03/2012 4:40 PM          Andrena Mews, DO 08/03/12 1640  I performed a history and physical examination of  Wendy Howard and discussed her management with Dr. Berline Chough. I agree with the history, physical, assessment, and plan of care, with the following exceptions: None I was present for the following procedures: None  Time Spent in Critical Care of the patient: None  Time spent in discussions with the patient and family: 5 minutes  Delmar Dondero  Derwood Kaplan, MD 08/03/12 1818

## 2013-03-03 ENCOUNTER — Inpatient Hospital Stay (HOSPITAL_COMMUNITY)
Admission: AD | Admit: 2013-03-03 | Discharge: 2013-03-03 | Disposition: A | Discharge status: Home / Self Care | Payer: Self-pay | Source: Ambulatory Visit | Attending: Obstetrics and Gynecology | Admitting: Obstetrics and Gynecology

## 2013-03-03 DIAGNOSIS — Z3201 Encounter for pregnancy test, result positive: Secondary | ICD-10-CM | POA: Insufficient documentation

## 2013-03-03 LAB — POCT PREGNANCY, URINE: Preg Test, Ur: POSITIVE — AB

## 2013-03-03 NOTE — MAU Note (Signed)
Pt LMP 01/17/2013, +UPT.  Pt denies bleeding or pain.

## 2013-03-03 NOTE — MAU Provider Note (Signed)
  History     CSN: [id number]  Arrival date and time: 03/03/13 1842   None     Chief Complaint  Patient presents with  . Possible Pregnancy   HPI This is a 42 y.o. female at 6.3wks by LMP who presents requesting confirmation of pregnancy. "I am 42 and I don't believe my 2 tests at home!" Denies complaints  RN Note: Pt LMP 01/17/2013, +UPT. Pt denies bleeding or pain.  OB History   Grav Para Term Preterm Abortions TAB SAB Ect Mult Living                  No past medical history on file.  Past Surgical History  Procedure Laterality Date  . Knee surgery    . Knee surgery    . Knee surgery      No family history on file.  History  Substance Use Topics  . Smoking status: Current Every Day Smoker    Types: Cigarettes  . Smokeless tobacco: Not on file  . Alcohol Use: Yes     Comment: occaisional     Allergies:  Allergies  Allergen Reactions  . Other Hives    Seafood-hives    Prescriptions prior to admission  Medication Sig Dispense Refill  . fluconazole (DIFLUCAN) 150 MG tablet Take 1 tablet (150mg ) today.  Take an additional 1 tablet (150mg ) in 3 days  2 tablet  0    Review of Systems  Constitutional: Negative for fever, chills and malaise/fatigue.  Gastrointestinal: Negative for nausea, vomiting and abdominal pain.   Physical Exam   Blood pressure 127/80, pulse 85, temperature 98.3 F (36.8 C), temperature source Oral, resp. rate 16, height 5\' 2"  (1.575 m), weight 153 lb 6.4 oz (69.582 kg), last menstrual period 01/17/2013.  Physical Exam  Constitutional: She is oriented to person, place, and time. She appears well-developed and well-nourished. No distress.  HENT:  Head: Normocephalic.  Cardiovascular: Normal rate.   Respiratory: Effort normal.  Musculoskeletal: Normal range of motion.  Neurological: She is alert and oriented to person, place, and time.  Skin: Skin is warm and dry.  Psychiatric: She has a normal mood and affect.    MAU Course   Procedures  MDM Results for orders placed during the hospital encounter of 03/03/13 (from the past 24 hour(s))  POCT PREGNANCY, URINE     Status: Abnormal   Collection Time    03/03/13  7:54 PM      Result Value Range   Preg Test, Ur POSITIVE (*) NEGATIVE     Assessment and Plan  A:  Pregnancy at 6.3weeks  P:  Pregnancy verification letter provided      LIst of OB providers and Medicaid info given  [facility] 03/03/2013, 8:13 PM

## 2013-03-04 NOTE — MAU Provider Note (Signed)
Attestation of Attending Supervision of Advanced Practitioner (CNM/NP): Evaluation and management procedures were performed by the Advanced Practitioner under my supervision and collaboration.  I have reviewed the Advanced Practitioner's note and chart, and I agree with the management and plan.  Danaly Bari 03/04/2013 8:13 AM   

## 2013-03-07 ENCOUNTER — Encounter: Payer: Self-pay | Admitting: Advanced Practice Midwife

## 2013-03-09 ENCOUNTER — Inpatient Hospital Stay (HOSPITAL_COMMUNITY): Payer: Medicaid Other

## 2013-03-09 ENCOUNTER — Encounter (HOSPITAL_COMMUNITY): Payer: Self-pay | Admitting: *Deleted

## 2013-03-09 ENCOUNTER — Inpatient Hospital Stay (HOSPITAL_COMMUNITY)
Admission: AD | Admit: 2013-03-09 | Discharge: 2013-03-09 | Disposition: A | Discharge status: Home / Self Care | Payer: Medicaid Other | Source: Ambulatory Visit | Attending: Obstetrics and Gynecology | Admitting: Obstetrics and Gynecology

## 2013-03-09 DIAGNOSIS — R1032 Left lower quadrant pain: Secondary | ICD-10-CM | POA: Insufficient documentation

## 2013-03-09 DIAGNOSIS — N390 Urinary tract infection, site not specified: Secondary | ICD-10-CM | POA: Insufficient documentation

## 2013-03-09 DIAGNOSIS — N760 Acute vaginitis: Secondary | ICD-10-CM | POA: Insufficient documentation

## 2013-03-09 DIAGNOSIS — O2390 Unspecified genitourinary tract infection in pregnancy, unspecified trimester: Secondary | ICD-10-CM | POA: Insufficient documentation

## 2013-03-09 DIAGNOSIS — B9689 Other specified bacterial agents as the cause of diseases classified elsewhere: Secondary | ICD-10-CM | POA: Insufficient documentation

## 2013-03-09 DIAGNOSIS — M545 Low back pain, unspecified: Secondary | ICD-10-CM | POA: Insufficient documentation

## 2013-03-09 DIAGNOSIS — A499 Bacterial infection, unspecified: Secondary | ICD-10-CM | POA: Insufficient documentation

## 2013-03-09 LAB — WET PREP, GENITAL
Trich, Wet Prep: NONE SEEN
Yeast Wet Prep HPF POC: NONE SEEN

## 2013-03-09 LAB — URINALYSIS, ROUTINE W REFLEX MICROSCOPIC
Bilirubin Urine: NEGATIVE
Glucose, UA: NEGATIVE mg/dL
Ketones, ur: NEGATIVE mg/dL
Nitrite: POSITIVE — AB
pH: 6 (ref 5.0–8.0)

## 2013-03-09 LAB — CBC
HCT: 37.3 % (ref 36.0–46.0)
Hemoglobin: 12.4 g/dL (ref 12.0–15.0)
MCH: 28.5 pg (ref 26.0–34.0)
MCHC: 33.2 g/dL (ref 30.0–36.0)
MCV: 85.7 fL (ref 78.0–100.0)
RDW: 12.5 % (ref 11.5–15.5)

## 2013-03-09 LAB — URINE MICROSCOPIC-ADD ON

## 2013-03-09 MED ORDER — CEPHALEXIN 500 MG PO CAPS: 500.0000 mg | ORAL_CAPSULE | Freq: Four times a day (QID) | ORAL | Status: DC

## 2013-03-09 MED ORDER — METRONIDAZOLE 500 MG PO TABS: 500.0000 mg | ORAL_TABLET | Freq: Two times a day (BID) | ORAL | Status: DC

## 2013-03-09 NOTE — MAU Note (Signed)
abd pain, LLQ and lower abd, pain is cramping comes and goes.

## 2013-03-09 NOTE — MAU Provider Note (Signed)
History     CSN: [id number]  Arrival date and time: 03/09/13 1478   First Provider Initiated Contact with Patient 03/09/13 1017      Chief Complaint  Patient presents with  . Abdominal Pain   HPI Ms. Wendy Howard is a 42 y.o. G1P0 at [redacted]w[redacted]d who presents to MAU today with complaint of lower abdominal cramping since Saturday. The patient states that the pain is mostly in the LLQ and towards the midline. The pain comes and goes. She denies vaginal bleeding, discharge, UTI symptoms, fever, flank pain, N/V/D or constipation. She is concerned that her urine has had a strong odor recently. She is also having occasional lower back pain. Patient has an appointment with Dr. Clearance Coots to start prenatal care on 03/14/13.   OB History   Grav Para Term Preterm Abortions TAB SAB Ect Mult Living   1               History reviewed. No pertinent past medical history.  Past Surgical History  Procedure Laterality Date  . Knee surgery    . Knee surgery    . Knee surgery      History reviewed. No pertinent family history.  History  Substance Use Topics  . Smoking status: Former Smoker    Types: Cigarettes  . Smokeless tobacco: Not on file  . Alcohol Use: Yes     Comment: occaisional     Allergies:  Allergies  Allergen Reactions  . Other Hives    Seafood-hives    No prescriptions prior to admission    Review of Systems  Constitutional: Negative for fever and malaise/fatigue.  Gastrointestinal: Positive for abdominal pain. Negative for nausea, vomiting, diarrhea and constipation.  Genitourinary: Negative for dysuria, urgency, frequency and hematuria.       Neg - vaginal bleeding, discharge  Neurological: Negative for dizziness, loss of consciousness and weakness.   Physical Exam   Blood pressure 116/68, pulse 79, temperature 98.9 F (37.2 C), temperature source Oral, resp. rate 18, height 5' 2.5" (1.588 m), weight 156 lb (70.761 kg), last menstrual period 01/17/2013.  Physical  Exam  Constitutional: She is oriented to person, place, and time. She appears well-developed and well-nourished. No distress.  HENT:  Head: Normocephalic and atraumatic.  Cardiovascular: Normal rate, regular rhythm and normal heart sounds.   Respiratory: Effort normal and breath sounds normal. No respiratory distress.  GI: Soft. Bowel sounds are normal. She exhibits no distension and no mass. There is no tenderness. There is no rebound, no guarding and no CVA tenderness.  Genitourinary: Uterus is enlarged (slightly). Uterus is not tender. Cervix exhibits no motion tenderness, no discharge and no friability. Right adnexum displays no mass and no tenderness. Left adnexum displays no mass and no tenderness. No bleeding around the vagina. Vaginal discharge (moderate amount of thin, white discharge noted) found.  Neurological: She is alert and oriented to person, place, and time.  Skin: Skin is warm and dry. No erythema.  Psychiatric: She has a normal mood and affect.  Cervix: closed, long, thick and posterior  Results for orders placed during the hospital encounter of 03/09/13 (from the past 24 hour(s))  URINALYSIS, ROUTINE W REFLEX MICROSCOPIC     Status: Abnormal   Collection Time    03/09/13  9:28 AM      Result Value Range   Color, Urine YELLOW  YELLOW   APPearance CLEAR  CLEAR   Specific Gravity, Urine >1.030 (*) 1.005 - 1.030   pH 6.0  5.0 - 8.0   Glucose, UA NEGATIVE  NEGATIVE mg/dL   Hgb urine dipstick MODERATE (*) NEGATIVE   Bilirubin Urine NEGATIVE  NEGATIVE   Ketones, ur NEGATIVE  NEGATIVE mg/dL   Protein, ur NEGATIVE  NEGATIVE mg/dL   Urobilinogen, UA 0.2  0.0 - 1.0 mg/dL   Nitrite POSITIVE (*) NEGATIVE   Leukocytes, UA NEGATIVE  NEGATIVE  URINE MICROSCOPIC-ADD ON     Status: Abnormal   Collection Time    03/09/13  9:28 AM      Result Value Range   Squamous Epithelial / LPF RARE  RARE   WBC, UA 0-2  <3 WBC/hpf   RBC / HPF 0-2  <3 RBC/hpf   Bacteria, UA MANY (*) RARE  WET  PREP, GENITAL     Status: Abnormal   Collection Time    03/09/13 10:32 AM      Result Value Range   Yeast Wet Prep HPF POC NONE SEEN  NONE SEEN   Trich, Wet Prep NONE SEEN  NONE SEEN   Clue Cells Wet Prep HPF POC MODERATE (*) NONE SEEN   WBC, Wet Prep HPF POC FEW (*) NONE SEEN  CBC     Status: None   Collection Time    03/09/13 10:42 AM      Result Value Range   WBC 10.0  4.0 - 10.5 K/uL   RBC 4.35  3.87 - 5.11 MIL/uL   Hemoglobin 12.4  12.0 - 15.0 g/dL   HCT 16.1  09.6 - 04.5 %   MCV 85.7  78.0 - 100.0 fL   MCH 28.5  26.0 - 34.0 pg   MCHC 33.2  30.0 - 36.0 g/dL   RDW 40.9  81.1 - 91.4 %   Platelets 217  150 - 400 K/uL  ABO/RH     Status: None   Collection Time    03/09/13 10:42 AM      Result Value Range   ABO/RH(D) A POS    HCG, QUANTITATIVE, PREGNANCY     Status: Abnormal   Collection Time    03/09/13 10:42 AM      Result Value Range   hCG, Beta Chain, Sharene Butters, [place] [id number] (*) <5 mIU/mL   US Ob Comp Less 14 Wks  03/09/2013   CLINICAL DATA:  Left lower quadrant abdominal and pelvic pain, unknown LMP, positive pregnancy test  EXAM: OBSTETRIC <14 WK ULTRASOUND  TECHNIQUE: Transabdominal ultrasound was performed for evaluation of the gestation as well as the maternal uterus and adnexal regions.  COMPARISON:  None.  FINDINGS: Intrauterine gestational sac: Visualized/normal in shape.  Yolk sac:  Visualized  Embryo:  Visualized  Cardiac Activity: Visualized  Heart Rate: 152 bpm  CRL:   10  mm   7 w 1d                  [place] EDC: 10/25/13  Maternal uterus/adnexae: Right ovarian corpus luteum. Normal left ovary. Small anterior uterine fibroid 0.7 x 0.6 x 0.5 cm.  IMPRESSION: A single live intrauterine gestation measuring 7 weeks 1 day by crown-rump length today.   Electronically Signed   By: Christiana Pellant M.D.   On: 03/09/2013 13:09    MAU Course  Procedures None  MDM UA, Wet prep, GC/CHlamydia, CBC, ABO/Rh, quant hCG and [place] today  Assessment and Plan  A: UTI  Bacterial  vaginosis  P: Discharge home Rx for Keflex and Flagyl sent to patients pharmacy First trimester warning signs discussed Follow-up with Dr.  Clearance Coots as scheduled or sooner if symptoms persist or worsen Patient may return to MAU as needed or if her condition were to change or worsen  Freddi Starr, PA-C  03/09/2013, 1:39 PM

## 2013-03-10 NOTE — MAU Provider Note (Signed)
Attestation of Attending Supervision of Advanced Practitioner (CNM/NP): Evaluation and management procedures were performed by the Advanced Practitioner under my supervision and collaboration.  I have reviewed the Advanced Practitioner's note and chart, and I agree with the management and plan.  Eual Lindstrom 03/10/2013 10:43 AM

## 2013-03-14 ENCOUNTER — Encounter: Payer: Self-pay | Admitting: Obstetrics

## 2013-03-14 ENCOUNTER — Ambulatory Visit (INDEPENDENT_AMBULATORY_CARE_PROVIDER_SITE_OTHER): Payer: Medicaid Other | Admitting: Obstetrics

## 2013-03-14 VITALS — BP 105/69 | Temp 97.6°F | Wt 158.0 lb

## 2013-03-14 DIAGNOSIS — Z3401 Encounter for supervision of normal first pregnancy, first trimester: Secondary | ICD-10-CM

## 2013-03-14 DIAGNOSIS — O09519 Supervision of elderly primigravida, unspecified trimester: Secondary | ICD-10-CM

## 2013-03-14 DIAGNOSIS — Z3201 Encounter for pregnancy test, result positive: Secondary | ICD-10-CM

## 2013-03-14 LAB — POCT URINALYSIS DIPSTICK
Ketones, UA: NEGATIVE
Leukocytes, UA: NEGATIVE
Nitrite, UA: NEGATIVE
Urobilinogen, UA: NEGATIVE
pH, UA: 6

## 2013-03-14 MED ORDER — CITRANATAL HARMONY 27-1-250 MG PO CAPS: 1.0000 | ORAL_CAPSULE | Freq: Every day | ORAL | Status: DC

## 2013-03-14 NOTE — Progress Notes (Signed)
Pulse- 63  Subjective:    Wendy Howard is being seen today for her first obstetrical visit.  This is not a planned pregnancy. She is at [redacted]w[redacted]d gestation. Her obstetrical history is significant for advanced maternal age. Relationship with FOB: significant other, living together. Patient does intend to breast feed. Pregnancy history fully reviewed.  Menstrual History: OB History   Grav Para Term Preterm Abortions TAB SAB Ect Mult Living   1               Last Pap: 2013 WNL Menarche age: 37  Patient's last menstrual period was 01/17/2013.    The following portions of the patient's history were reviewed and updated as appropriate: allergies, current medications, past family history, past medical history, past social history, past surgical history and problem list.  Review of Systems Pertinent items are noted in HPI.    Objective:    General appearance: alert and no distress Abdomen: normal findings: soft, non-tender Pelvic: cervix normal in appearance, external genitalia normal, no adnexal masses or tenderness, no cervical motion tenderness, rectovaginal septum normal, uterus normal size, shape, and consistency and vagina normal without discharge Extremities: extremities normal, atraumatic, no cyanosis or edema    Assessment:    Pregnancy at [redacted]w[redacted]d weeks    Plan:    Initial labs drawn. Prenatal vitamins.  Counseling provided regarding continued use of seat belts, cessation of alcohol consumption, smoking or use of illicit drugs; infection precautions i.e., influenza/TDAP immunizations, toxoplasmosis,CMV, parvovirus, listeria and varicella; workplace safety, exercise during pregnancy; routine dental care, safe medications, sexual activity, hot tubs, saunas, pools, travel, caffeine use, fish and methlymercury, potential toxins, hair treatments, varicose veins Weight gain recommendations per IOM guidelines reviewed: underweight/BMI< 18.5--> gain 28 - 40 lbs; normal weight/BMI 18.5 -  24.9--> gain 25 - 35 lbs; overweight/BMI 25 - 29.9--> gain 15 - 25 lbs; obese/BMI >30->gain  11 - 20 lbs Problem list reviewed and updated. FIRST/CF mutation testing/NIPT/QUAD SCREEN discussed: requested. Role of ultrasound in pregnancy discussed; fetal survey: requested. Amniocentesis discussed: not indicated. VBAC calculator score: VBAC consent form provided Follow up in 4 weeks. 50% of 20 min visit spent on counseling and coordination of care.

## 2013-03-15 ENCOUNTER — Other Ambulatory Visit: Payer: Self-pay | Admitting: Obstetrics

## 2013-03-15 DIAGNOSIS — Z3682 Encounter for antenatal screening for nuchal translucency: Secondary | ICD-10-CM

## 2013-03-15 LAB — OBSTETRIC PANEL
Antibody Screen: NEGATIVE
Basophils Absolute: 0 10*3/uL (ref 0.0–0.1)
Basophils Relative: 0 % (ref 0–1)
Eosinophils Relative: 3 % (ref 0–5)
HCT: 38.8 % (ref 36.0–46.0)
Hemoglobin: 12.8 g/dL (ref 12.0–15.0)
MCHC: 33 g/dL (ref 30.0–36.0)
MCV: 86 fL (ref 78.0–100.0)
Monocytes Absolute: 0.8 10*3/uL (ref 0.1–1.0)
Monocytes Relative: 9 % (ref 3–12)
RDW: 13.4 % (ref 11.5–15.5)
Rh Type: POSITIVE
Rubella: 2.99 Index — ABNORMAL HIGH (ref <0.90)

## 2013-03-15 LAB — HIV ANTIBODY (ROUTINE TESTING W REFLEX): HIV: NONREACTIVE

## 2013-03-15 LAB — VARICELLA ZOSTER ANTIBODY, IGG: Varicella IgG: >4000 Index — ABNORMAL HIGH (ref <135.00)

## 2013-03-15 LAB — VITAMIN D 25 HYDROXY (VIT D DEFICIENCY, FRACTURES): Vit D, 25-Hydroxy: 14 ng/mL — ABNORMAL LOW (ref 30–89)

## 2013-03-15 LAB — CULTURE, OB URINE: Organism ID, Bacteria: NO GROWTH

## 2013-03-16 ENCOUNTER — Encounter: Payer: Self-pay | Admitting: *Deleted

## 2013-03-16 LAB — HEMOGLOBINOPATHY EVALUATION: Hgb F Quant: 0 % (ref 0.0–2.0)

## 2013-03-22 ENCOUNTER — Encounter: Payer: Self-pay | Admitting: *Deleted

## 2013-04-01 ENCOUNTER — Other Ambulatory Visit: Payer: Self-pay | Admitting: *Deleted

## 2013-04-01 ENCOUNTER — Encounter: Payer: Self-pay | Admitting: *Deleted

## 2013-04-01 DIAGNOSIS — E559 Vitamin D deficiency, unspecified: Secondary | ICD-10-CM

## 2013-04-01 MED ORDER — OB COMPLETE PETITE 35-5-1-200 MG PO CAPS: 1.0000 | ORAL_CAPSULE | Freq: Every day | ORAL | Status: DC

## 2013-04-04 ENCOUNTER — Encounter: Payer: Self-pay | Admitting: Advanced Practice Midwife

## 2013-04-04 ENCOUNTER — Encounter: Payer: Self-pay | Admitting: Obstetrics

## 2013-04-08 ENCOUNTER — Encounter: Payer: Self-pay | Admitting: Obstetrics

## 2013-04-11 ENCOUNTER — Encounter: Payer: Medicaid Other | Admitting: Obstetrics

## 2013-04-11 ENCOUNTER — Encounter: Payer: Self-pay | Admitting: Obstetrics

## 2013-04-11 ENCOUNTER — Ambulatory Visit (INDEPENDENT_AMBULATORY_CARE_PROVIDER_SITE_OTHER): Payer: Medicaid Other | Admitting: Obstetrics

## 2013-04-11 VITALS — BP 116/73 | Temp 98.5°F | Wt 163.0 lb

## 2013-04-11 DIAGNOSIS — Z3401 Encounter for supervision of normal first pregnancy, first trimester: Secondary | ICD-10-CM

## 2013-04-11 DIAGNOSIS — O3090 Multiple gestation, unspecified, unspecified trimester: Secondary | ICD-10-CM

## 2013-04-11 DIAGNOSIS — O3680X1 Pregnancy with inconclusive fetal viability, fetus 1: Secondary | ICD-10-CM

## 2013-04-11 DIAGNOSIS — B3731 Acute candidiasis of vulva and vagina: Secondary | ICD-10-CM

## 2013-04-11 DIAGNOSIS — Z3400 Encounter for supervision of normal first pregnancy, unspecified trimester: Secondary | ICD-10-CM

## 2013-04-11 DIAGNOSIS — F4321 Adjustment disorder with depressed mood: Secondary | ICD-10-CM

## 2013-04-11 DIAGNOSIS — O3680X0 Pregnancy with inconclusive fetal viability, not applicable or unspecified: Secondary | ICD-10-CM | POA: Insufficient documentation

## 2013-04-11 DIAGNOSIS — B373 Candidiasis of vulva and vagina: Secondary | ICD-10-CM

## 2013-04-11 LAB — POCT URINALYSIS DIPSTICK
BILIRUBIN UA: NEGATIVE
Blood, UA: NEGATIVE
GLUCOSE UA: NEGATIVE
KETONES UA: NEGATIVE
Leukocytes, UA: NEGATIVE
Nitrite, UA: NEGATIVE
Protein, UA: NEGATIVE
SPEC GRAV UA: 1.01
Urobilinogen, UA: NEGATIVE
pH, UA: 8

## 2013-04-11 MED ORDER — FLUCONAZOLE 150 MG PO TABS: 150.0000 mg | ORAL_TABLET | Freq: Once | ORAL | Status: DC

## 2013-04-11 NOTE — Progress Notes (Signed)
HR - 90 Pt in office today for routine OB visit, Pt thinks she has a yeast infection, reports vaginal irritation

## 2013-04-11 NOTE — Addendum Note (Signed)
Addended by: Baltazar Najjar A on: 04/11/2013 04:31 PM   Modules accepted: Orders, Level of Service

## 2013-04-12 ENCOUNTER — Encounter: Payer: Self-pay | Admitting: *Deleted

## 2013-04-12 ENCOUNTER — Other Ambulatory Visit: Payer: Self-pay | Admitting: Obstetrics

## 2013-04-12 ENCOUNTER — Ambulatory Visit (HOSPITAL_COMMUNITY)
Admission: RE | Admit: 2013-04-12 | Discharge: 2013-04-12 | Disposition: A | Discharge status: Home / Self Care | Payer: Medicaid Other | Source: Ambulatory Visit | Attending: Obstetrics | Admitting: Obstetrics

## 2013-04-12 DIAGNOSIS — O3680X0 Pregnancy with inconclusive fetal viability, not applicable or unspecified: Secondary | ICD-10-CM | POA: Insufficient documentation

## 2013-04-12 DIAGNOSIS — Z3689 Encounter for other specified antenatal screening: Secondary | ICD-10-CM | POA: Insufficient documentation

## 2013-04-12 DIAGNOSIS — O3680X1 Pregnancy with inconclusive fetal viability, fetus 1: Secondary | ICD-10-CM

## 2013-04-12 MED ORDER — DIAZEPAM 2 MG PO TABS: 2.0000 mg | ORAL_TABLET | Freq: Two times a day (BID) | ORAL | Status: DC | PRN

## 2013-04-12 MED ORDER — SERTRALINE HCL 50 MG PO TABS: 50.0000 mg | ORAL_TABLET | Freq: Every day | ORAL | Status: DC

## 2013-04-12 NOTE — Addendum Note (Signed)
Addended by: Baltazar Najjar A on: 04/12/2013 12:45 PM   Modules accepted: Orders

## 2013-04-19 ENCOUNTER — Encounter: Payer: Self-pay | Admitting: *Deleted

## 2013-04-19 ENCOUNTER — Ambulatory Visit (HOSPITAL_COMMUNITY): Payer: Medicaid Other

## 2013-04-19 ENCOUNTER — Other Ambulatory Visit (HOSPITAL_COMMUNITY): Payer: Medicaid Other

## 2013-04-20 ENCOUNTER — Ambulatory Visit (HOSPITAL_COMMUNITY): Payer: Medicaid Other | Attending: Obstetrics

## 2013-04-20 ENCOUNTER — Ambulatory Visit (HOSPITAL_COMMUNITY): Payer: Medicaid Other

## 2013-04-26 ENCOUNTER — Encounter: Payer: Self-pay | Admitting: Obstetrics

## 2013-04-26 ENCOUNTER — Ambulatory Visit (INDEPENDENT_AMBULATORY_CARE_PROVIDER_SITE_OTHER): Payer: Medicaid Other | Admitting: Obstetrics

## 2013-04-26 VITALS — BP 117/78 | HR 84 | Temp 99.0°F | Ht 62.0 in | Wt 159.0 lb

## 2013-04-26 DIAGNOSIS — O021 Missed abortion: Secondary | ICD-10-CM

## 2013-04-26 DIAGNOSIS — IMO0002 Reserved for concepts with insufficient information to code with codable children: Secondary | ICD-10-CM

## 2013-04-26 NOTE — Progress Notes (Signed)
Subjective:     Wendy Howard is a 43 y.o. female here for exam following diagnosis of 7 week IUFD on U/S 2 weeks ago.  Current complaints: still having some cramping and light spotting.  Personal health questionnaire reviewed: yes.   Gynecologic History Patient's last menstrual period was 01/17/2013. Contraception: none Last Pap: unknown results normal Last mammogram: N/A  Obstetric History OB History  Gravida Para Term Preterm AB SAB TAB Ectopic Multiple Living  1             # Outcome Date GA Lbr Len/2nd Weight Sex Delivery Anes PTL Lv  1 GRA              Comments: System Generated. Please review and update pregnancy details.       The following portions of the patient's history were reviewed and updated as appropriate: allergies, current medications, past family history, past medical history, past social history, past surgical history and problem list.  Review of Systems Pertinent items are noted in HPI.    Objective:    No exam performed today, Consult for D&E to be scheduled..    Assessment:    7 week IUFD   Plan:    Education reviewed: Management of 1st trimester IUFD.. Suction D&E scheduled.

## 2013-04-27 ENCOUNTER — Other Ambulatory Visit: Payer: Self-pay | Admitting: *Deleted

## 2013-04-27 ENCOUNTER — Encounter: Payer: Self-pay | Admitting: Obstetrics

## 2013-04-27 DIAGNOSIS — IMO0002 Reserved for concepts with insufficient information to code with codable children: Secondary | ICD-10-CM | POA: Insufficient documentation

## 2013-04-29 ENCOUNTER — Encounter (HOSPITAL_COMMUNITY): Payer: Self-pay | Admitting: Pharmacist

## 2013-05-01 NOTE — Anesthesia Preprocedure Evaluation (Addendum)
Anesthesia Evaluation  Patient identified by MRN, date of birth, ID band Patient awake    Reviewed: Allergy & Precautions, H&P , NPO status , Patient's Chart, lab work & pertinent test results  Airway       Dental   Pulmonary former smoker,          Cardiovascular negative cardio ROS      Neuro/Psych negative neurological ROS  negative psych ROS   GI/Hepatic negative GI ROS, Neg liver ROS,   Endo/Other  negative endocrine ROS  Renal/GU negative Renal ROS     Musculoskeletal negative musculoskeletal ROS (+)   Abdominal   Peds  Hematology negative hematology ROS (+)   Anesthesia Other Findings   Reproductive/Obstetrics negative OB ROS                        Anesthesia Physical Anesthesia Plan  ASA: I  Anesthesia Plan: MAC   Post-op Pain Management:    Induction: Intravenous  Airway Management Planned:   Additional Equipment:   Intra-op Plan:   Post-operative Plan:   Informed Consent: I have reviewed the patients History and Physical, chart, labs and discussed the procedure including the risks, benefits and alternatives for the proposed anesthesia with the patient or authorized representative who has indicated his/her understanding and acceptance.   Dental advisory given  Plan Discussed with: CRNA  Anesthesia Plan Comments:       Anesthesia Quick Evaluation

## 2013-05-02 ENCOUNTER — Ambulatory Visit (HOSPITAL_COMMUNITY)
Admission: RE | Admit: 2013-05-02 | Discharge: 2013-05-02 | Disposition: A | Discharge status: Home / Self Care | Payer: Medicaid Other | Source: Ambulatory Visit | Attending: Obstetrics | Admitting: Obstetrics

## 2013-05-02 ENCOUNTER — Ambulatory Visit (HOSPITAL_COMMUNITY): Payer: Medicaid Other | Admitting: Anesthesiology

## 2013-05-02 ENCOUNTER — Encounter (HOSPITAL_COMMUNITY)
Admission: RE | Disposition: A | Discharge status: Home / Self Care | Payer: Self-pay | Source: Ambulatory Visit | Attending: Obstetrics

## 2013-05-02 ENCOUNTER — Encounter (HOSPITAL_COMMUNITY): Payer: Self-pay | Admitting: Certified Registered"

## 2013-05-02 ENCOUNTER — Encounter (HOSPITAL_COMMUNITY): Payer: Medicaid Other | Admitting: Anesthesiology

## 2013-05-02 DIAGNOSIS — O021 Missed abortion: Secondary | ICD-10-CM | POA: Insufficient documentation

## 2013-05-02 DIAGNOSIS — Z87891 Personal history of nicotine dependence: Secondary | ICD-10-CM | POA: Insufficient documentation

## 2013-05-02 HISTORY — PX: DILATION AND EVACUATION: SHX1459

## 2013-05-02 LAB — CBC
HCT: 39.6 % (ref 36.0–46.0)
Hemoglobin: 13 g/dL (ref 12.0–15.0)
MCH: 28.3 pg (ref 26.0–34.0)
MCHC: 32.8 g/dL (ref 30.0–36.0)
MCV: 86.1 fL (ref 78.0–100.0)
Platelets: 185 10*3/uL (ref 150–400)
RBC: 4.6 MIL/uL (ref 3.87–5.11)
RDW: 12.8 % (ref 11.5–15.5)
WBC: 9.1 10*3/uL (ref 4.0–10.5)

## 2013-05-02 SURGERY — DILATION AND EVACUATION, UTERUS
Anesthesia: Monitor Anesthesia Care | Site: Vagina

## 2013-05-02 MED ORDER — SODIUM CHLORIDE 0.9 % IJ SOLN: 3.0000 mL | Freq: Two times a day (BID) | INTRAMUSCULAR | Status: DC

## 2013-05-02 MED ORDER — OXYCODONE HCL 5 MG PO TABS: 5.0000 mg | ORAL_TABLET | ORAL | Status: DC | PRN

## 2013-05-02 MED ORDER — PROPOFOL 10 MG/ML IV EMUL
INTRAVENOUS | Status: AC
  Filled 2013-05-02: qty 20

## 2013-05-02 MED ORDER — FENTANYL CITRATE 0.05 MG/ML IJ SOLN
INTRAMUSCULAR | Status: AC
  Administered 2013-05-02: 50 ug via INTRAVENOUS
  Filled 2013-05-02: qty 2

## 2013-05-02 MED ORDER — MIDAZOLAM HCL 2 MG/2ML IJ SOLN: 0.5000 mg | Freq: Once | INTRAMUSCULAR | Status: DC | PRN

## 2013-05-02 MED ORDER — OXYTOCIN 10 UNIT/ML IJ SOLN
40.0000 [IU] | INTRAVENOUS | Status: DC | PRN
  Administered 2013-05-02: 10 [IU] via INTRAVENOUS
  Administered 2013-05-02: 30 [IU] via INTRAVENOUS

## 2013-05-02 MED ORDER — KETOROLAC TROMETHAMINE 30 MG/ML IJ SOLN
INTRAMUSCULAR | Status: AC
  Filled 2013-05-02: qty 1

## 2013-05-02 MED ORDER — PROPOFOL 10 MG/ML IV EMUL
INTRAVENOUS | Status: DC | PRN
  Administered 2013-05-02: 30 mg via INTRAVENOUS
  Administered 2013-05-02: 20 mg via INTRAVENOUS
  Administered 2013-05-02 (×5): 30 mg via INTRAVENOUS

## 2013-05-02 MED ORDER — DEXAMETHASONE SODIUM PHOSPHATE 10 MG/ML IJ SOLN
INTRAMUSCULAR | Status: DC | PRN
  Administered 2013-05-02: 10 mg via INTRAVENOUS

## 2013-05-02 MED ORDER — KETOROLAC TROMETHAMINE 30 MG/ML IJ SOLN: 15.0000 mg | Freq: Once | INTRAMUSCULAR | Status: DC | PRN

## 2013-05-02 MED ORDER — SODIUM CHLORIDE 0.9 % IV SOLN: 250.0000 mL | INTRAVENOUS | Status: DC | PRN

## 2013-05-02 MED ORDER — LIDOCAINE HCL (CARDIAC) 20 MG/ML IV SOLN
INTRAVENOUS | Status: AC
  Filled 2013-05-02: qty 5

## 2013-05-02 MED ORDER — LIDOCAINE HCL (CARDIAC) 20 MG/ML IV SOLN
INTRAVENOUS | Status: DC | PRN
  Administered 2013-05-02: 80 mg via INTRAVENOUS

## 2013-05-02 MED ORDER — PROMETHAZINE HCL 25 MG/ML IJ SOLN: 6.2500 mg | INTRAMUSCULAR | Status: DC | PRN

## 2013-05-02 MED ORDER — FENTANYL CITRATE 0.05 MG/ML IJ SOLN: 25.0000 ug | INTRAMUSCULAR | Status: DC | PRN

## 2013-05-02 MED ORDER — MEPERIDINE HCL 25 MG/ML IJ SOLN: 6.2500 mg | INTRAMUSCULAR | Status: DC | PRN

## 2013-05-02 MED ORDER — DEXAMETHASONE SODIUM PHOSPHATE 10 MG/ML IJ SOLN
INTRAMUSCULAR | Status: AC
  Filled 2013-05-02: qty 1

## 2013-05-02 MED ORDER — OXYTOCIN 10 UNIT/ML IJ SOLN
INTRAMUSCULAR | Status: AC
  Filled 2013-05-02: qty 4

## 2013-05-02 MED ORDER — LIDOCAINE HCL 1 % IJ SOLN
INTRAMUSCULAR | Status: DC | PRN
  Administered 2013-05-02: 20 mL

## 2013-05-02 MED ORDER — HYDROCODONE-ACETAMINOPHEN 5-325 MG PO TABS: 1.0000 | ORAL_TABLET | Freq: Four times a day (QID) | ORAL | Status: DC | PRN

## 2013-05-02 MED ORDER — FENTANYL CITRATE 0.05 MG/ML IJ SOLN
INTRAMUSCULAR | Status: DC | PRN
  Administered 2013-05-02: 25 ug via INTRAVENOUS
  Administered 2013-05-02: 50 ug via INTRAVENOUS
  Administered 2013-05-02: 25 ug via INTRAVENOUS

## 2013-05-02 MED ORDER — ONDANSETRON HCL 4 MG/2ML IJ SOLN: 4.0000 mg | Freq: Four times a day (QID) | INTRAMUSCULAR | Status: DC | PRN

## 2013-05-02 MED ORDER — ONDANSETRON HCL 4 MG/2ML IJ SOLN
INTRAMUSCULAR | Status: AC
  Filled 2013-05-02: qty 2

## 2013-05-02 MED ORDER — FENTANYL CITRATE 0.05 MG/ML IJ SOLN
25.0000 ug | INTRAMUSCULAR | Status: DC | PRN
  Administered 2013-05-02: 50 ug via INTRAVENOUS

## 2013-05-02 MED ORDER — MIDAZOLAM HCL 2 MG/2ML IJ SOLN
INTRAMUSCULAR | Status: DC | PRN
  Administered 2013-05-02: 2 mg via INTRAVENOUS

## 2013-05-02 MED ORDER — ACETAMINOPHEN 650 MG RE SUPP: 650.0000 mg | RECTAL | Status: DC | PRN

## 2013-05-02 MED ORDER — LIDOCAINE HCL 1 % IJ SOLN
INTRAMUSCULAR | Status: AC
  Filled 2013-05-02: qty 20

## 2013-05-02 MED ORDER — SODIUM CHLORIDE 0.9 % IJ SOLN: 3.0000 mL | INTRAMUSCULAR | Status: DC | PRN

## 2013-05-02 MED ORDER — MIDAZOLAM HCL 2 MG/2ML IJ SOLN
INTRAMUSCULAR | Status: AC
  Filled 2013-05-02: qty 2

## 2013-05-02 MED ORDER — LACTATED RINGERS IV SOLN
INTRAVENOUS | Status: DC
  Administered 2013-05-02 (×2): via INTRAVENOUS

## 2013-05-02 MED ORDER — IBUPROFEN 800 MG PO TABS: 800.0000 mg | ORAL_TABLET | Freq: Three times a day (TID) | ORAL | Status: DC | PRN

## 2013-05-02 MED ORDER — FENTANYL CITRATE 0.05 MG/ML IJ SOLN
INTRAMUSCULAR | Status: AC
  Filled 2013-05-02: qty 2

## 2013-05-02 MED ORDER — ONDANSETRON HCL 4 MG/2ML IJ SOLN
INTRAMUSCULAR | Status: DC | PRN
  Administered 2013-05-02: 4 mg via INTRAVENOUS

## 2013-05-02 MED ORDER — KETOROLAC TROMETHAMINE 30 MG/ML IJ SOLN
INTRAMUSCULAR | Status: DC | PRN
  Administered 2013-05-02: 30 mg via INTRAVENOUS

## 2013-05-02 MED ORDER — ACETAMINOPHEN 325 MG PO TABS: 650.0000 mg | ORAL_TABLET | ORAL | Status: DC | PRN

## 2013-05-02 SURGICAL SUPPLY — 23 items
CATH ROBINSON RED A/P 16FR (CATHETERS) ×2 IMPLANT
CLOTH BEACON ORANGE TIMEOUT ST (SAFETY) ×2 IMPLANT
DECANTER SPIKE VIAL GLASS SM (MISCELLANEOUS) ×2 IMPLANT
GLOVE BIO SURGEON STRL SZ8 (GLOVE) ×4 IMPLANT
GOWN STRL REUS W/TWL LRG LVL3 (GOWN DISPOSABLE) ×2 IMPLANT
GOWN STRL REUS W/TWL XL LVL3 (GOWN DISPOSABLE) ×2 IMPLANT
KIT BERKELEY 1ST TRIMESTER 3/8 (MISCELLANEOUS) ×2 IMPLANT
NDL HYPO 25X1 1.5 SAFETY (NEEDLE) ×1 IMPLANT
NDL SPNL 22GX3.5 QUINCKE BK (NEEDLE) ×1 IMPLANT
NEEDLE HYPO 25X1 1.5 SAFETY (NEEDLE) ×2 IMPLANT
NEEDLE SPNL 22GX3.5 QUINCKE BK (NEEDLE) ×2 IMPLANT
NS IRRIG 1000ML POUR BTL (IV SOLUTION) ×2 IMPLANT
PACK VAGINAL MINOR WOMEN LF (CUSTOM PROCEDURE TRAY) ×2 IMPLANT
PAD OB MATERNITY 4.3X12.25 (PERSONAL CARE ITEMS) ×2 IMPLANT
PAD PREP 24X48 CUFFED NSTRL (MISCELLANEOUS) ×2 IMPLANT
SET BERKELEY SUCTION TUBING (SUCTIONS) ×2 IMPLANT
SYR CONTROL 10ML LL (SYRINGE) ×2 IMPLANT
TOWEL OR 17X24 6PK STRL BLUE (TOWEL DISPOSABLE) ×4 IMPLANT
VACURETTE 10 RIGID CVD (CANNULA) IMPLANT
VACURETTE 12 RIGID CVD (CANNULA) ×2 IMPLANT
VACURETTE 7MM CVD STRL WRAP (CANNULA) IMPLANT
VACURETTE 8 RIGID CVD (CANNULA) IMPLANT
VACURETTE 9 RIGID CVD (CANNULA) ×1 IMPLANT

## 2013-05-02 NOTE — Op Note (Signed)
Preoperative diagnosis: 7 week IUFD on ultrasound  Postoperative diagnosis: same  Procedure: Suction dilatation and curretage Surgeon: HARPER,CHARLES A  Anesthesia:MAC with Paracervical Block  Estimated blood loss: 131ml  Urine output: 48ml  IV Fluids: [id number]  Complications: None  Specimen: PATHOLOGY  Operative Findings: 7 week IUFD.  Description of procedure:   The patient was taken to the operating room and placed on the operating table in the semi-lithotomy position in [place].  Examination under anesthesia was performed.  The patient was prepped and draped in the usual manner.  After a time-out had been completed, a speculum was placed in the vagina.  The anterior lip of the cervix was grasped with a single-toothed tenaculum.  A paracervical block was performed using 10 ml of 1% lidocaine.  The block was performed at 4 and 8 o'clock at the cervical vaginal junction. The cervix was dilated with Kennon Rounds dilators.  A 9 -mm suction curet was inserted in the uterine cavity.  The device was activated and the curet rotated to evacuate the products of conception.   All the instruments were removed from the vagina.  Final instrument counts were correct.  The patient was taken to the PACU in stable condition.

## 2013-05-02 NOTE — Anesthesia Postprocedure Evaluation (Signed)
Anesthesia Post Note  Patient: Wendy Howard  Procedure(s) Performed: Procedure(s) (LRB): SUCTION DILATATION AND EVACUATION (N/A)  Anesthesia type: MAC  Patient location: PACU  Post pain: Pain level controlled  Post assessment: Post-op Vital signs reviewed  Last Vitals:  Filed Vitals:   05/02/13 0815  BP: 107/72  Pulse: 76  Temp:   Resp: 13    Post vital signs: Reviewed  Level of consciousness: sedated  Complications: No apparent anesthesia complications

## 2013-05-02 NOTE — Transfer of Care (Signed)
Immediate Anesthesia Transfer of Care Note  Patient: Wendy Howard  Procedure(s) Performed: Procedure(s): SUCTION DILATATION AND EVACUATION (N/A)  Patient Location: PACU  Anesthesia Type:MAC  Level of Consciousness: awake, alert  and oriented  Airway & Oxygen Therapy: Patient Spontanous Breathing and Patient connected to nasal cannula oxygen  Post-op Assessment: Report given to PACU RN, Post -op Vital signs reviewed and stable and Patient moving all extremities  Post vital signs: Reviewed and stable  Complications: No apparent anesthesia complications

## 2013-05-02 NOTE — H&P (Signed)
Wendy Howard is an 43 y.o. female. 7 week IUFD on 04-12-13.  Presents for D&E.  Pertinent Gynecological History: Menses: n/a Bleeding: spotting Contraception: none DES exposure: denies Blood transfusions: none Sexually transmitted diseases: no past history Previous GYN Procedures: none  Last mammogram: n/a Date: n/a Last pap: normal Date: 2014 OB History: G1, P0   Menstrual History: Menarche age: not asked  Patient's last menstrual period was 01/17/2013.    Past Medical History  Diagnosis Date  . Medical history non-contributory     Past Surgical History  Procedure Laterality Date  . Knee surgery    . Knee surgery    . Knee surgery      No family history on file.  Social History:  reports that she quit smoking about 1 months ago. Her smoking use included Cigarettes. She smoked 0.00 packs per day. She has never used smokeless tobacco. She reports that she does not drink alcohol or use illicit drugs.  Allergies:  Allergies  Allergen Reactions  . Other Hives    Seafood-hives    Prescriptions prior to admission  Medication Sig Dispense Refill  . ibuprofen (ADVIL,MOTRIN) 200 MG tablet Take 800 mg by mouth every 6 (six) hours as needed for headache.        Review of Systems  All other systems reviewed and are negative.    Blood pressure 109/73, pulse 91, temperature 98.2 F (36.8 C), temperature source Oral, resp. rate 20, last menstrual period 01/17/2013, SpO2 100.00%, unknown if currently breastfeeding. Physical Exam  Nursing note and vitals reviewed. Constitutional: She is oriented to person, place, and time. She appears well-developed and well-nourished.  HENT:  Head: Normocephalic and atraumatic.  Eyes: Conjunctivae are normal. Pupils are equal, round, and reactive to light.  Neck: Normal range of motion. Neck supple.  Cardiovascular: Normal rate and regular rhythm.   Respiratory: Effort normal and breath sounds normal.  GI: Soft.  Neurological: She is  alert and oriented to person, place, and time.  Skin: Skin is warm and dry.  Psychiatric: She has a normal mood and affect. Her behavior is normal. Judgment and thought content normal.    No results found for this or any previous visit (from the past 24 hour(s)).  No results found.  Assessment/Plan: 7 week IUFD on ultrasound.  Presents for D&E.  HARPER,CHARLES A 05/02/2013, 6:17 AM

## 2013-05-02 NOTE — Discharge Instructions (Signed)
DISCHARGE INSTRUCTIONS: D&E The following instructions have been prepared to help you care for yourself upon your return home.  No ibuprofen containing products (Advil, Aleve, Motrin, etc.) until after 1:30 pm today.     Personal hygiene:  Use sanitary pads for vaginal drainage, not tampons.  Shower the day after your procedure.  NO tub baths, pools or Jacuzzis for 2-3 weeks.  Wipe front to back after using the bathroom.  Activity and limitations:  Do NOT drive or operate any equipment for 24 hours. The effects of anesthesia are still present and drowsiness may result.  Do NOT rest in bed all day.  Walking is encouraged.  Walk up and down stairs slowly.  You may resume your normal activity in one to two days or as indicated by your physician.  Sexual activity: NO intercourse for at least 2 weeks after the procedure, or as indicated by your physician.  Diet: Eat a light meal as desired this evening. You may resume your usual diet tomorrow.  Return to work: You may resume your work activities in one to two days or as indicated by your doctor.  What to expect after your surgery: Expect to have vaginal bleeding/discharge for 2-3 days and spotting for up to 10 days. It is not unusual to have soreness for up to 1-2 weeks. You may have a slight burning sensation when you urinate for the first day. Mild cramps may continue for a couple of days. You may have a regular period in 2-6 weeks.  Call your doctor for any of the following:  Excessive vaginal bleeding, saturating and changing one pad every hour.  Inability to urinate 6 hours after discharge from hospital.  Pain not relieved by pain medication.  Fever of 100.4 F or greater.  Unusual vaginal discharge or odor.   Call for an appointment:    Patients signature: ______________________  Nurses signature ________________________  Support person's signature_______________________

## 2013-05-02 NOTE — Preoperative (Signed)
Beta Blockers   Reason not to administer Beta Blockers:Not Applicable 

## 2013-05-02 NOTE — Op Note (Signed)
D&C or Vacuum Curettage Care After Read the instructions below. Refer to this sheet in the next few weeks. These instructions provide you with general information on caring for yourself after you leave the hospital. Your caregiver may also give you specific instructions.  D&C or vacuum curettage is a minor operation. A D&C involves the stretching (dilatation) of the cervix and scraping (curettage) of the inside lining of the uterus. A vacuum curettage gently sucks out the lining and tissue in the uterus with a tube. You may have light cramping and bleeding for a couple of days to two weeks after the procedure. This procedure may be done in a hospital, outpatient clinic, or doctor's office. You may be given a drug to make you sleep (general anesthetic) or a drug that numbs the area (local anesthetic) in and around the cervix. HOME CARE INSTRUCTIONS  Do not drive for 24 hours.   Wait one week before returning to strenuous activities.   Take your temperature two times a day for 4 days and write it down. Provide these temperatures to your caregiver if they are abnormal (above 98.6 F or 37.0 C).   Avoid long periods of standing, and do no heavy lifting (more than 10 pounds), pushing or pulling.   Limit stair climbing to once or twice a day.   Take rest periods often.   You may resume your usual diet.   Drink plenty of fluids (6-8 glasses a day).   You should return to your usual bowel function. If constipation should occur, you may:   Take a mild laxative with permission from your caregiver.   Add fruit and bran to your diet.   Drink more fluids. This helps with constipation.   Take showers instead of baths until your caregiver gives you permission to take baths.   Do not go swimming or use a hot tub until your caregiver gives you permission.   Try to have someone with you or available for you the first 24 to 48 hours, especially if you had a general anesthetic.   Do not douche, use  tampons, or have intercourse until after your follow-up appointment, or when your caregiver approves.   Only take over-the-counter or prescription medicines for pain, discomfort, or fever as directed by your caregiver. Do not take aspirin. It can cause bleeding.   If a prescription was given, follow your caregiver's directions. You may be given a medicine that kills germs (antibiotic) to prevent an infection.   Keep all your follow-up appointments recommended by your caregiver.  SEEK MEDICAL CARE IF:  You have increasing cramps or pain not relieved with medication.   You develop belly (abdominal) pain which does not seem to be related to the same area of earlier cramping and pain.   You feel dizzy or feel like fainting.   You have bad smelling vaginal discharge.   You develop a rash.   You develop a reaction or allergy to your medication.  SEEK IMMEDIATE MEDICAL CARE IF:  Bleeding is heavier than a normal menstrual period.   You have an oral temperature above 100.6, not controlled by medicine.   You develop chest pain.   You develop shortness of breath.   You pass out.   You develop pain in your shoulder strap area.   You develop heavy vaginal bleeding with or without blood clots.  MAKE SURE YOU:   Understand these instructions.   Will watch your condition.   Will get help right away if   you are not doing well or get worse.  UPDATED HEALTH PRACTICES  A PAP smear is done to screen for cervical cancer.   The first PAP smear should be done at age 21.   Between ages 21 and 29, PAP smears are repeated every 2 years.   Beginning at age 30, you are advised to have a PAP smear every 3 years as long as your past 3 PAP smears have been normal.   Some women have medical problems that increase the chance of getting cervical cancer. Talk to your caregiver about these problems. It is especially important to talk to your caregiver if a new problem develops soon after your last PAP  smear. In these cases, your caregiver may recommend more frequent screening and Pap smears.   The above recommendations are the same for women who have or have not gotten the vaccine for HPV (Human Papillomavirus).   If you had a hysterectomy for a problem that was not a cancer or a condition that could lead to cancer, then you no longer need Pap smears.   If you are between ages 65 and 70, and you have had normal Pap smears going back 10 years, you no longer need Pap smears.   If you have had past treatment for cervical cancer or a condition that could lead to cancer, you need Pap smears and screening for cancer for at least 20 years after your treatment.   Continue monthly self-breast examinations. Your caregiver can provide information and instructions for self-breast examination.  Document Released: 03/14/2000 Document Re-Released: 09/04/2009 [employer] Patient Information 2011 [employer]. 

## 2013-05-03 ENCOUNTER — Encounter (HOSPITAL_COMMUNITY): Payer: Self-pay | Admitting: Obstetrics

## 2013-05-05 ENCOUNTER — Encounter: Payer: Self-pay | Admitting: Obstetrics

## 2013-05-05 ENCOUNTER — Encounter: Payer: Self-pay | Admitting: *Deleted

## 2013-05-09 ENCOUNTER — Encounter: Payer: Medicaid Other | Admitting: Obstetrics

## 2013-05-16 ENCOUNTER — Encounter: Payer: Self-pay | Admitting: Obstetrics

## 2013-05-16 ENCOUNTER — Ambulatory Visit (INDEPENDENT_AMBULATORY_CARE_PROVIDER_SITE_OTHER): Payer: Medicaid Other | Admitting: Obstetrics

## 2013-05-16 VITALS — BP 119/81 | HR 90 | Temp 97.8°F | Ht 62.0 in | Wt 164.0 lb

## 2013-05-16 DIAGNOSIS — O021 Missed abortion: Secondary | ICD-10-CM

## 2013-05-16 NOTE — Progress Notes (Signed)
Subjective:     Wendy Howard is a 43 y.o. female who presents to the clinic 2 weeks status post D&C for Missed Abortion. Eating a regular diet with difficulty. Bowel movements are normal. The patient is not having any pain.  The following portions of the patient's history were reviewed and updated as appropriate: allergies, current medications, past family history, past medical history, past social history, past surgical history and problem list.  Review of Systems Pertinent items are noted in HPI.    Objective:    BP 119/81  Pulse 90  Temp(Src) 97.8 F (36.6 C)  Ht 5\' 2"  (1.575 m)  Wt 164 lb (74.39 kg)  BMI 29.99 kg/m2  LMP 01/17/2013 General:  alert and no distress  Abdomen: soft, bowel sounds active, non-tender  Incision:    None.  Pelvic:  NEFG.  Vagina normal.  Uterus NSSC, NT.  Adnexa neg.      Assessment:    Doing well postoperatively. Operative findings again reviewed. Pathology report discussed.    Plan:     1. Continue any current medications. 2. Wound care discussed. 3. Activity restrictions: none 4. Anticipated return to work: now. 5. Follow up: 6 weeks for suture removal.

## 2013-06-13 ENCOUNTER — Encounter: Payer: Self-pay | Admitting: *Deleted

## 2013-06-27 ENCOUNTER — Ambulatory Visit (INDEPENDENT_AMBULATORY_CARE_PROVIDER_SITE_OTHER): Payer: Medicaid Other | Admitting: Obstetrics

## 2013-06-27 ENCOUNTER — Encounter: Payer: Self-pay | Admitting: Obstetrics

## 2013-06-27 VITALS — BP 123/81 | HR 114 | Temp 98.4°F | Ht 62.0 in | Wt 163.0 lb

## 2013-06-27 DIAGNOSIS — B9689 Other specified bacterial agents as the cause of diseases classified elsewhere: Secondary | ICD-10-CM

## 2013-06-27 DIAGNOSIS — N760 Acute vaginitis: Secondary | ICD-10-CM

## 2013-06-27 DIAGNOSIS — A499 Bacterial infection, unspecified: Secondary | ICD-10-CM

## 2013-06-27 DIAGNOSIS — IMO0002 Reserved for concepts with insufficient information to code with codable children: Secondary | ICD-10-CM

## 2013-06-27 NOTE — Progress Notes (Signed)
Subjective:     Wendy Howard is a 43 y.o. female who presents to the clinic 8 weeks status post D&C for IUFD. Eating a regular diet without difficulty. Bowel movements are normal. Pain is controlled without any medications. Patient states she has been having cramps off and on.   The following portions of the patient's history were reviewed and updated as appropriate: allergies, current medications, past family history, past medical history, past social history, past surgical history and problem list.  Review of Systems Pertinent items are noted in HPI.    Objective:    BP 123/81  Pulse 114  Temp(Src) 98.4 F (36.9 C)  Ht 5\' 2"  (1.575 m)  LMP 06/02/2012 General:  alert and no distress Abdomen:  Soft, NT. Pelvic:  NEFG.  Uterus NSSC, NT.   Assessment:    Doing well postoperatively. Operative findings again reviewed. Pathology report discussed.    Plan:    1. Continue any current medications. 2. Wound care discussed. 3. Activity restrictions: none 4. Anticipated return to work: now. 5. Follow up: Annual.

## 2013-06-28 ENCOUNTER — Encounter: Payer: Self-pay | Admitting: Obstetrics

## 2013-06-28 LAB — WET PREP BY MOLECULAR PROBE
CANDIDA SPECIES: NEGATIVE
Gardnerella vaginalis: POSITIVE — AB
Trichomonas vaginosis: NEGATIVE

## 2013-07-02 ENCOUNTER — Encounter: Payer: Self-pay | Admitting: Obstetrics

## 2013-07-02 DIAGNOSIS — B9689 Other specified bacterial agents as the cause of diseases classified elsewhere: Secondary | ICD-10-CM | POA: Insufficient documentation

## 2013-07-02 DIAGNOSIS — N760 Acute vaginitis: Secondary | ICD-10-CM

## 2013-07-07 ENCOUNTER — Other Ambulatory Visit: Payer: Self-pay | Admitting: *Deleted

## 2013-07-07 DIAGNOSIS — B9689 Other specified bacterial agents as the cause of diseases classified elsewhere: Secondary | ICD-10-CM

## 2013-07-07 DIAGNOSIS — N760 Acute vaginitis: Principal | ICD-10-CM

## 2013-07-07 MED ORDER — METRONIDAZOLE 500 MG PO TABS: 500.0000 mg | ORAL_TABLET | Freq: Two times a day (BID) | ORAL | Status: DC

## 2013-07-08 ENCOUNTER — Encounter: Payer: Self-pay | Admitting: Obstetrics

## 2014-01-02 ENCOUNTER — Ambulatory Visit: Payer: Medicaid Other | Admitting: Obstetrics

## 2014-01-09 ENCOUNTER — Ambulatory Visit: Payer: Medicaid Other | Admitting: Obstetrics

## 2014-01-30 ENCOUNTER — Encounter: Payer: Self-pay | Admitting: Obstetrics

## 2014-02-18 IMAGING — US US OB TRANSVAGINAL
1 series · 14 of 15 positions shown · non-contrast
Comparison: 03/09/2013

CLINICAL DATA: Pregnancy with inconclusive fetal viability. No
Doppler fetal heart tones.

EXAM:
TRANSVAGINAL OB ULTRASOUND
TECHNIQUE: Transvaginal ultrasound was performed for complete evaluation of the
gestation as well as the maternal uterus, adnexal regions, and
pelvic cul-de-sac.

[Series 1: us ob transvaginal · 15 acquisitions, 14 frames shown]
[im 1/15]
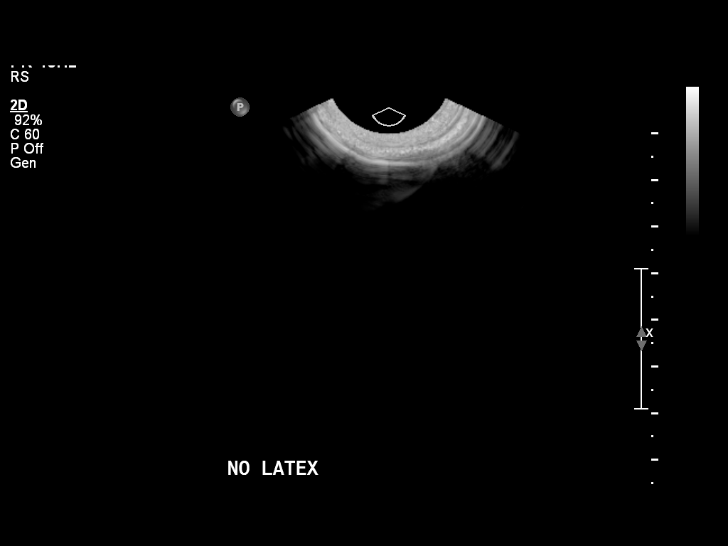
[im 2/15]
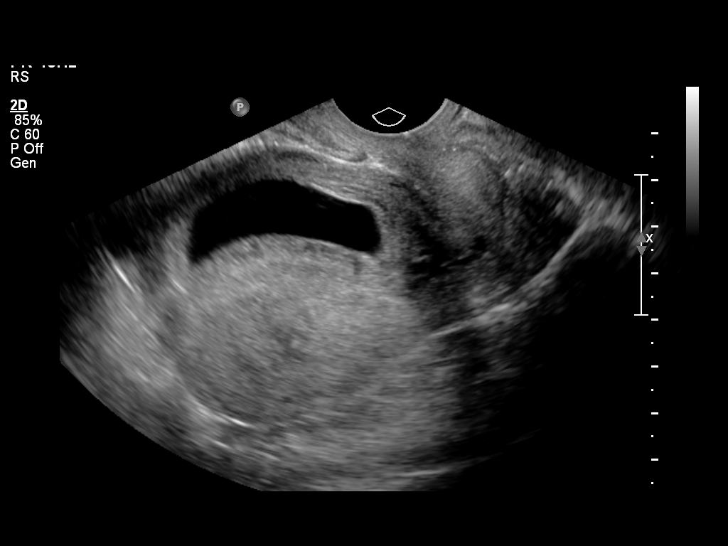
[im 3/15]
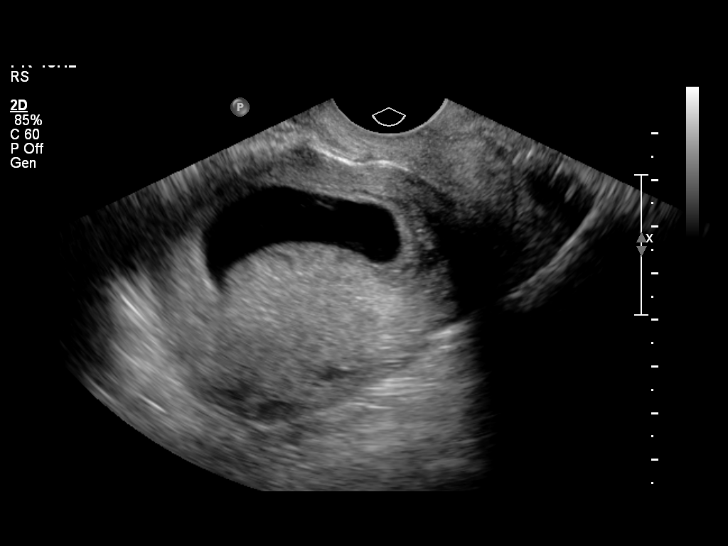
[im 4/15]
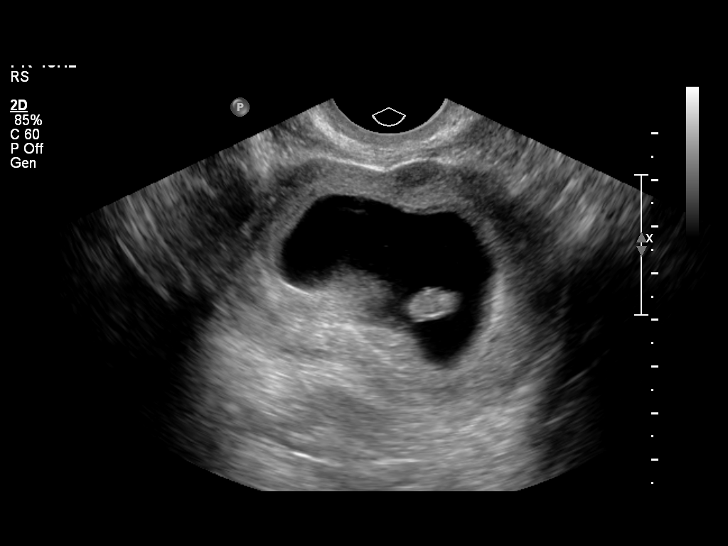
[im 5/15]
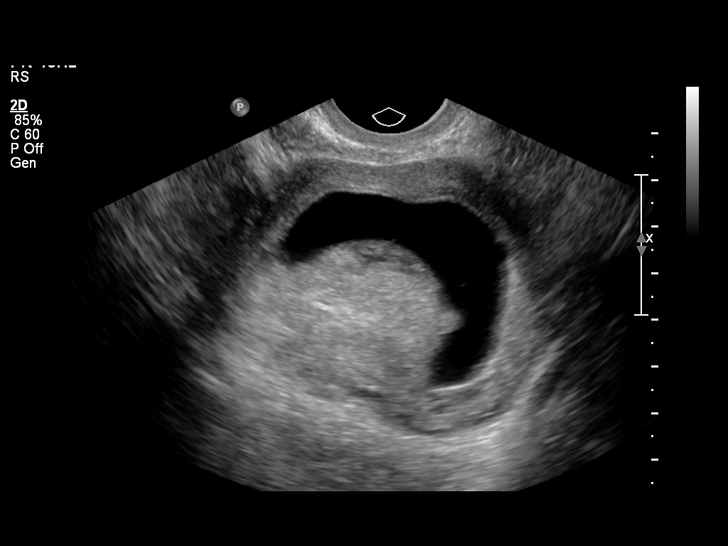
[im 6/15]
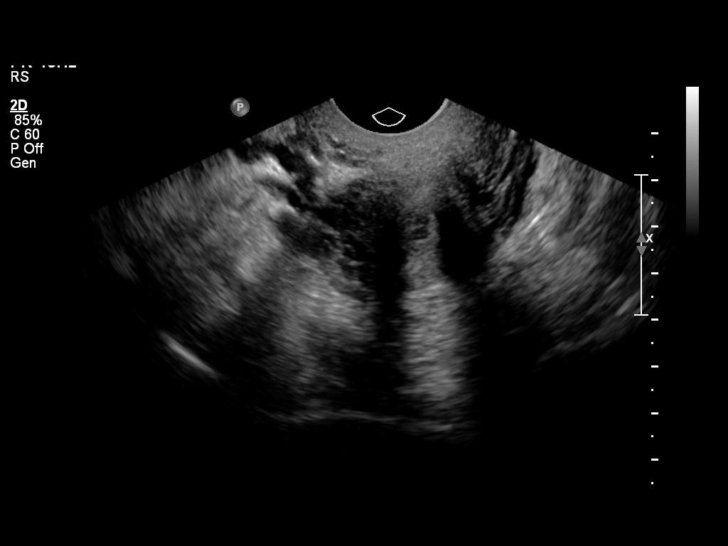
[im 7/15]
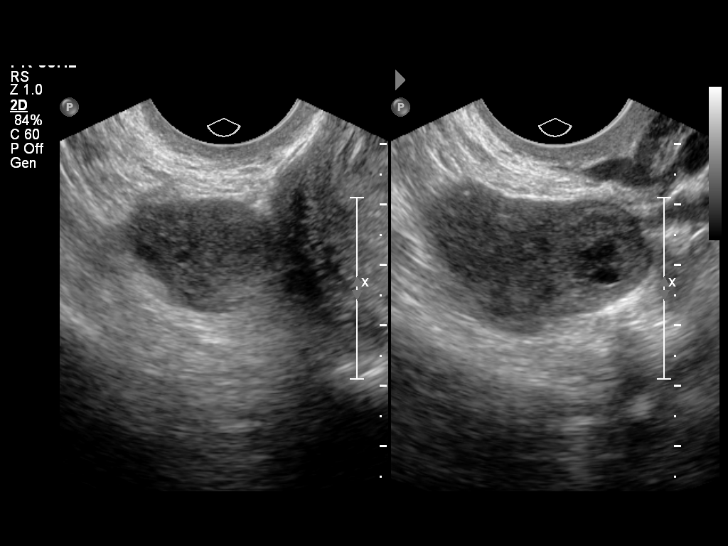
[im 9/15]
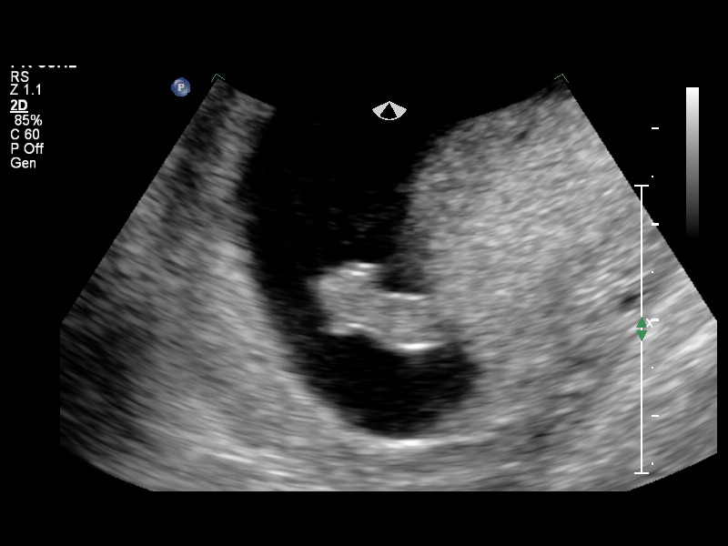
[im 10/15]
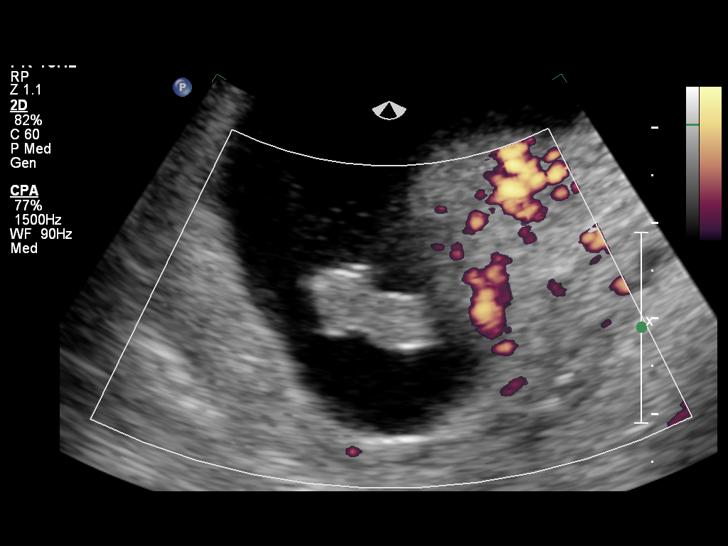
[im 11/15]
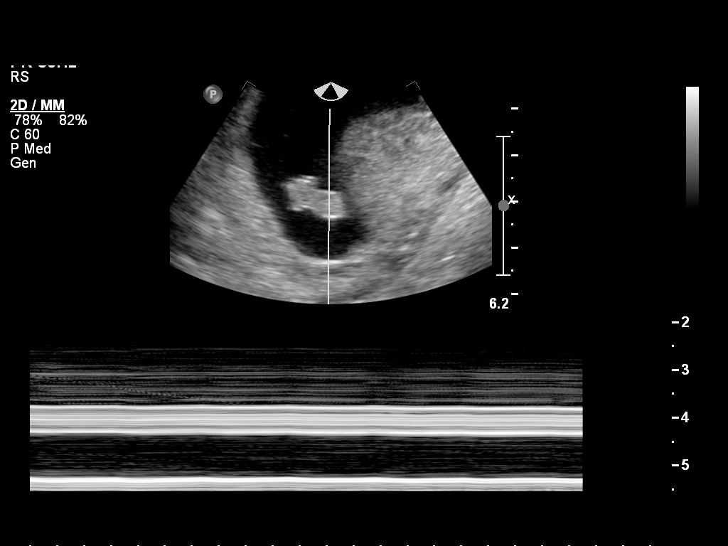
[im 12/15]
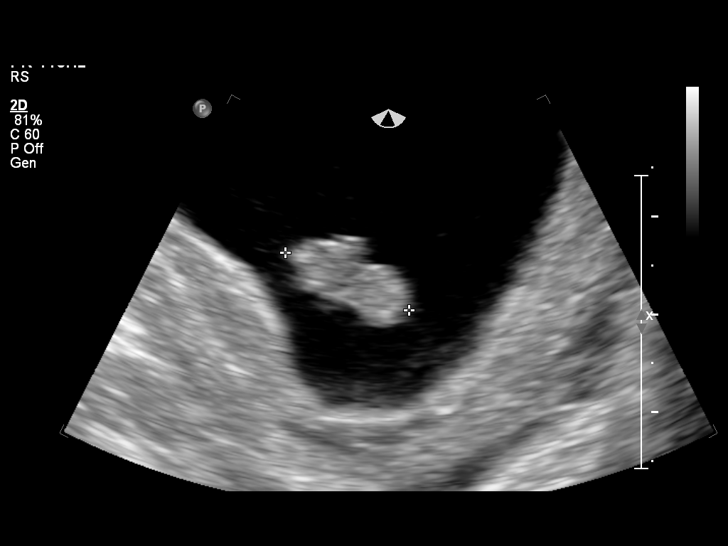
[im 13/15]
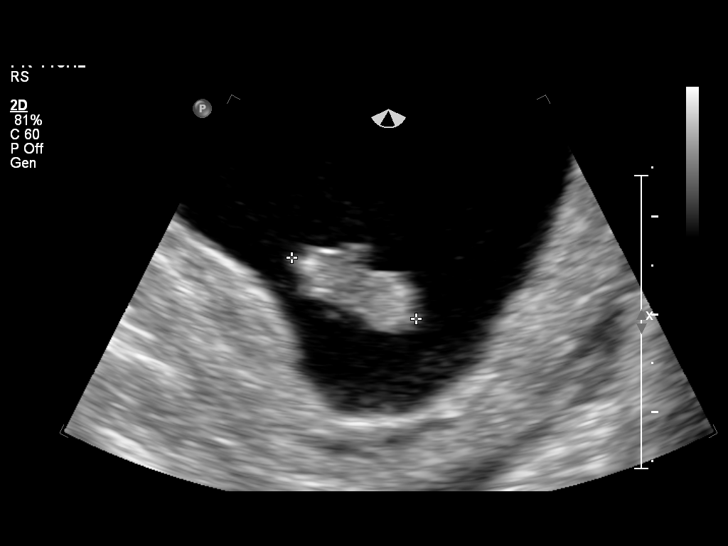
[im 14/15]
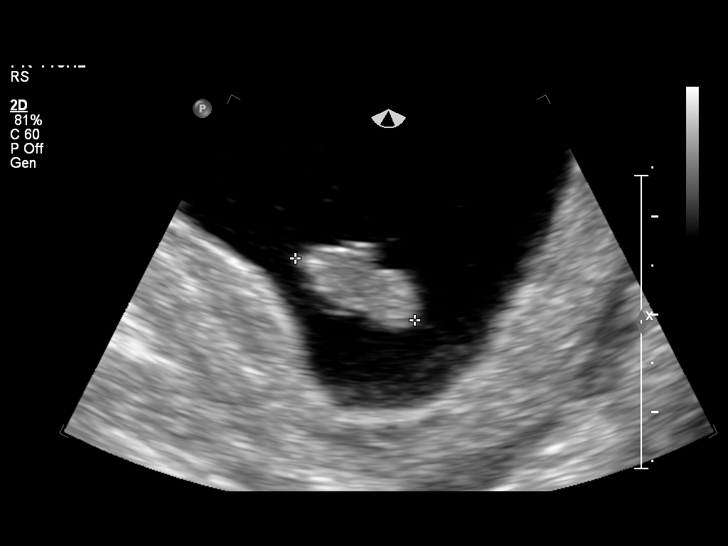
[im 15/15]
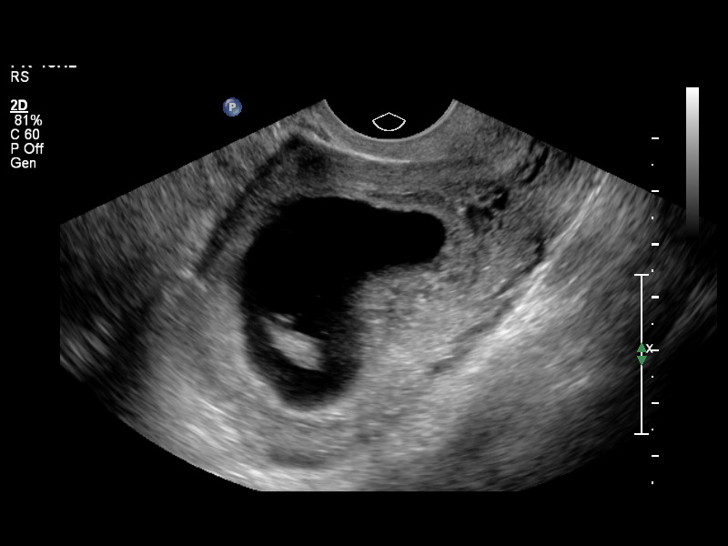

[14 of 15 positions shown; findings below may reference images not displayed]

FINDINGS: Intrauterine gestational sac: Visualized/normal in shape.

Yolk sac:  Visualized

Embryo:  Visualized

Cardiac Activity: Absent

CRL:   14  mm   7 w 5 d

Maternal uterus/adnexae: No mass or free fluid visualized. Both
ovaries are normal in appearance.
IMPRESSION: Findings meet definitive criteria for failed pregnancy. This follows
SRU consensus guidelines: Diagnostic Criteria for Nonviable
Pregnancy Early in the First Trimester. N Engl J Med

## 2014-10-21 ENCOUNTER — Encounter (HOSPITAL_COMMUNITY): Payer: Self-pay | Admitting: Emergency Medicine

## 2014-10-21 ENCOUNTER — Emergency Department (HOSPITAL_COMMUNITY)
Admission: EM | Admit: 2014-10-21 | Discharge: 2014-10-21 | Disposition: A | Discharge status: Home / Self Care | Payer: Medicaid Other | Attending: Emergency Medicine | Admitting: Emergency Medicine

## 2014-10-21 DIAGNOSIS — N760 Acute vaginitis: Secondary | ICD-10-CM | POA: Insufficient documentation

## 2014-10-21 DIAGNOSIS — Z720 Tobacco use: Secondary | ICD-10-CM | POA: Insufficient documentation

## 2014-10-21 DIAGNOSIS — B9689 Other specified bacterial agents as the cause of diseases classified elsewhere: Secondary | ICD-10-CM

## 2014-10-21 DIAGNOSIS — B354 Tinea corporis: Secondary | ICD-10-CM | POA: Insufficient documentation

## 2014-10-21 LAB — WET PREP, GENITAL
Trich, Wet Prep: NONE SEEN
Yeast Wet Prep HPF POC: NONE SEEN

## 2014-10-21 MED ORDER — METRONIDAZOLE 0.75 % VA GEL: 1.0000 | Freq: Two times a day (BID) | VAGINAL | Status: DC

## 2014-10-21 MED ORDER — CLOTRIMAZOLE 1 % EX CREA: TOPICAL_CREAM | CUTANEOUS | Status: DC

## 2014-10-21 NOTE — ED Notes (Signed)
Pt alert, oriented, and ambulatory upon DC. She was advised to follow up with [facility] outpatient clinic.

## 2014-10-21 NOTE — Discharge Instructions (Signed)
Bacterial Vaginosis Bacterial vaginosis is a vaginal infection that occurs when the normal balance of bacteria in the vagina is disrupted. It results from an overgrowth of certain bacteria. This is the most common vaginal infection in women of childbearing age. Treatment is important to prevent complications, especially in pregnant women, as it can cause a premature delivery. CAUSES  Bacterial vaginosis is caused by an increase in harmful bacteria that are normally present in smaller amounts in the vagina. Several different kinds of bacteria can cause bacterial vaginosis. However, the reason that the condition develops is not fully understood. RISK FACTORS Certain activities or behaviors can put you at an increased risk of developing bacterial vaginosis, including:  Having a new sex partner or multiple sex partners.  Douching.  Using an intrauterine device (IUD) for contraception. Women do not get bacterial vaginosis from toilet seats, bedding, swimming pools, or contact with objects around them. SIGNS AND SYMPTOMS  Some women with bacterial vaginosis have no signs or symptoms. Common symptoms include:  Grey vaginal discharge.  A fishlike odor with discharge, especially after sexual intercourse.  Itching or burning of the vagina and vulva.  Burning or pain with urination. DIAGNOSIS  Your health care provider will take a medical history and examine the vagina for signs of bacterial vaginosis. A sample of vaginal fluid may be taken. Your health care provider will look at this sample under a microscope to check for bacteria and abnormal cells. A vaginal pH test may also be done.  TREATMENT  Bacterial vaginosis may be treated with antibiotic medicines. These may be given in the form of a pill or a vaginal cream. A second round of antibiotics may be prescribed if the condition comes back after treatment.  HOME CARE INSTRUCTIONS   Only take over-the-counter or prescription medicines as  directed by your health care provider.  If antibiotic medicine was prescribed, take it as directed. Make sure you finish it even if you start to feel better.  Do not have sex until treatment is completed.  Tell all sexual partners that you have a vaginal infection. They should see their health care provider and be treated if they have problems, such as a mild rash or itching.  Practice safe sex by using condoms and only having one sex partner. SEEK MEDICAL CARE IF:   Your symptoms are not improving after 3 days of treatment.  You have increased discharge or pain.  You have a fever. MAKE SURE YOU:   Understand these instructions.  Will watch your condition.  Will get help right away if you are not doing well or get worse. FOR MORE INFORMATION  Centers for Disease Control and Prevention, Division of STD Prevention: AppraiserFraud.fi American Sexual Health Association (ASHA): www.ashastd.org  Document Released: 03/17/2005 Document Revised: 01/05/2013 Document Reviewed: 10/27/2012 [facility] Patient Information 2015 [place], [place]. This information is not intended to replace advice given to you by your health care provider. Make sure you discuss any questions you have with your health care provider. Body Ringworm Ringworm (tinea corporis) is a fungal infection of the skin on the body. This infection is not caused by worms, but is actually caused by a fungus. Fungus normally lives on the top of your skin and can be useful. However, in the case of ringworms, the fungus grows out of control and causes a skin infection. It can involve any area of skin on the body and can spread easily from one person to another (contagious). Ringworm is a common problem for  children, but it can affect adults as well. Ringworm is also often found in athletes, especially wrestlers who share equipment and mats.  CAUSES  Ringworm of the body is caused by a fungus called dermatophyte. It can spread  by:  Touchingother people who are infected.  Touchinginfected pets.  Touching or sharingobjects that have been in contact with the infected person or pet (hats, combs, towels, clothing, sports equipment). SYMPTOMS   Itchy, raised red spots and bumps on the skin.  Ring-shaped rash.  Redness near the border of the rash with a clear center.  Dry and scaly skin on or around the rash. Not every person develops a ring-shaped rash. Some develop only the red, scaly patches. DIAGNOSIS  Most often, ringworm can be diagnosed by performing a skin exam. Your caregiver may choose to take a skin scraping from the affected area. The sample will be examined under the microscope to see if the fungus is present.  TREATMENT  Body ringworm may be treated with a topical antifungal cream or ointment. Sometimes, an antifungal shampoo that can be used on your body is prescribed. You may be prescribed antifungal medicines to take by mouth if your ringworm is severe, keeps coming back, or lasts a long time.  HOME CARE INSTRUCTIONS   Only take over-the-counter or prescription medicines as directed by your caregiver.  Wash the infected area and dry it completely before applying yourcream or ointment.  When using antifungal shampoo to treat the ringworm, leave the shampoo on the body for 3-5 minutes before rinsing.   Wear loose clothing to stop clothes from rubbing and irritating the rash.  Wash or change your bed sheets every night while you have the rash.  Have your pet treated by your veterinarian if it has the same infection. To prevent ringworm:   Practice good hygiene.  Wear sandals or shoes in public places and showers.  Do not share personal items with others.  Avoid touching red patches of skin on other people.  Avoid touching pets that have bald spots or wash your hands after doing so. SEEK MEDICAL CARE IF:   Your rash continues to spread after 7 days of treatment.  Your rash is  not gone in 4 weeks.  The area around your rash becomes red, warm, tender, and swollen. Document Released: 03/14/2000 Document Revised: 12/10/2011 Document Reviewed: 09/29/2011 [facility] Patient Information 2015 [place], [place]. This information is not intended to replace advice given to you by your health care provider. Make sure you discuss any questions you have with your health care provider.

## 2014-10-21 NOTE — ED Notes (Addendum)
Pt arrived to the ED with a complaint of a rash on her chest.  Pt states she has had the rash that started to spread yesterday in the morning.  Rash is located on upper chest on the left side going towards the right. Pt states she has a seafood allergy but ate a tuna fish sandwich a couple of days ago.  Pt also says that she believes that she has vaginitis due to her belief that she has thrush of the mouth and tongue

## 2014-10-21 NOTE — Care Management Note (Signed)
Case Management Note  Patient Details  Name: Wendy Howard MRN: [id number] Date of Birth: 1970-08-21  Subjective/Objective:      Bacterial vaginitis        Action/Plan: Home  Expected Discharge Date:  10/21/2014               Expected Discharge Plan:  Home/Self Care  In-House Referral:     Discharge planning Services  CM Consult, Medication Assistance, MATCH Program   Status of Service:  Completed, signed off  Medicare Important Message Given:    Date Medicare IM Given:    Medicare IM give by:    Date Additional Medicare IM Given:    Additional Medicare Important Message give by:     If discussed at [place] of Stay Meetings, dates discussed:    Additional Comments: Received call from [facility] ED and pt called stating she could not afford Metrogel Cream. Spoke to pt and states cream is $130 at pharmacy. Explained MATCH program, $3.00 copay and program can be used only once per year. Verbalized understanding and prefers to use benefit for medication.  Erenest Rasher, RN 10/21/2014, 4:55 PM

## 2014-10-21 NOTE — ED Notes (Signed)
Pt reports rash to chest. Denies itching or taking any meds OTC for rash. She also c/o  Vaginal itching. She denies vaginal itching, discharge or new sexual  Partners.

## 2014-10-23 LAB — GC/CHLAMYDIA PROBE AMP (~~LOC~~) NOT AT ARMC
CHLAMYDIA, DNA PROBE: NEGATIVE
Neisseria Gonorrhea: NEGATIVE

## 2014-10-26 NOTE — ED Provider Notes (Signed)
CSN: [id number]     Arrival date & time 10/21/14  0238 History   First MD Initiated Contact with Patient 10/21/14 0325     Chief Complaint  Patient presents with  . Rash  . Vaginitis     (Consider location/radiation/quality/duration/timing/severity/associated sxs/prior Treatment) Patient is a 44 y.o. female presenting with rash. The history is provided by the patient. No language interpreter was used.  Rash Location:  Torso Torso rash location:  L chest Associated symptoms: no fever   Associated symptoms comment:  Rash to center of chest for the past several days. There is some itching. No redness, fever or drainage. She denies pain. She also complains of vaginal discharge and itching that she feels might be contributing to her rash.   Past Medical History  Diagnosis Date  . Medical history non-contributory    Past Surgical History  Procedure Laterality Date  . Knee surgery    . Knee surgery    . Knee surgery    . Dilation and evacuation N/A 05/02/2013    Procedure: SUCTION DILATATION AND EVACUATION;  Surgeon: Shelly Bombard, MD;  Location: [facility];  Service: Gynecology;  Laterality: N/A;   History reviewed. No pertinent family history. History  Substance Use Topics  . Smoking status: Current Every Day Smoker    Types: Cigarettes    Last Attempt to Quit: 03/03/2013  . Smokeless tobacco: Never Used  . Alcohol Use: Yes     Comment: occaisional    OB History    Gravida Para Term Preterm AB TAB SAB Ectopic Multiple Living   1              Review of Systems  Constitutional: Negative for fever and chills.  HENT: Negative.   Respiratory: Negative.   Cardiovascular: Negative.   Gastrointestinal: Negative.   Genitourinary: Positive for vaginal discharge.       See HPI.  Musculoskeletal: Negative.   Skin: Positive for rash.  Neurological: Negative.       Allergies  Other  Home Medications   Prior to Admission medications   Medication Sig Start Date End Date  Taking? Authorizing Provider  aspirin-acetaminophen-caffeine (EXCEDRIN MIGRAINE) [phone] MG per tablet Take 2 tablets by mouth every 6 (six) hours as needed for headache.   Yes Historical Provider, MD  clotrimazole (LOTRIMIN) 1 % cream Apply to affected area 2 times daily 10/21/14   Charlann Lange, PA-C  metroNIDAZOLE (METROGEL VAGINAL) 0.75 % vaginal gel Place 1 Applicatorful vaginally 2 (two) times daily. 10/21/14   Jaislyn Blinn, PA-C   BP 127/81 mmHg  Pulse 74  Temp(Src) 98.2 F (36.8 C) (Oral)  Resp 20  SpO2 98%  LMP 10/05/2014 Physical Exam  Constitutional: She is oriented to person, place, and time. She appears well-developed and well-nourished. No distress.  HENT:  Mouth/Throat: Oropharynx is clear and moist.  No white patches concerning for thrush.  Neck: Normal range of motion.  Cardiovascular: Normal rate and regular rhythm.   No murmur heard. Pulmonary/Chest: Effort normal and breath sounds normal. She has no wheezes. She has no rales. She exhibits no tenderness.  Abdominal: Soft. There is no tenderness. There is no rebound and no guarding.  Genitourinary:  Mild white vaginal discharge, non-clumping. No CMT, adnexal mass or tenderness.   Musculoskeletal: Normal range of motion.  Neurological: She is alert and oriented to person, place, and time.  Skin: Skin is warm and dry.  Small circular areas of scaling rash. Non-raised. C/w tinea.  Psychiatric: She has  a normal mood and affect.    ED Course  Procedures (including critical care time) Labs Review Labs Reviewed  WET PREP, GENITAL - Abnormal; Notable for the following:    Clue Cells Wet Prep HPF POC FEW (*)    WBC, Wet Prep HPF POC FEW (*)    All other components within normal limits  GC/CHLAMYDIA PROBE AMP ([place]) NOT AT [facility]    Imaging Review No results found.   EKG Interpretation None      MDM   Final diagnoses:  BV (bacterial vaginosis)  Tinea corporis    Patient is well appearing. No  evidence to support PID or pelvic infection. She has BV on lab studies. Will treat with Flagyl and use topical Lotrimin for rash to chest. She is felt stable for discharge home.     Charlann Lange, PA-C 10/26/14 2116  Veatrice Kells, MD 11/04/14 [phone]

## 2014-11-05 NOTE — ED Provider Notes (Signed)
Medical screening examination/treatment/procedure(s) were performed by non-physician practitioner and as supervising physician I was immediately available for consultation/collaboration.   EKG Interpretation None        Ripley Fraise, MD 11/05/14 [phone]

## 2015-01-19 ENCOUNTER — Emergency Department (HOSPITAL_COMMUNITY)
Admission: EM | Admit: 2015-01-19 | Discharge: 2015-01-19 | Discharge status: Absent without leave | Payer: Medicaid Other | Source: Home / Self Care

## 2015-01-19 ENCOUNTER — Encounter (HOSPITAL_COMMUNITY): Payer: Self-pay | Admitting: Oncology

## 2015-01-19 ENCOUNTER — Emergency Department (HOSPITAL_COMMUNITY)
Admission: EM | Admit: 2015-01-19 | Discharge: 2015-01-20 | Disposition: A | Discharge status: Home / Self Care | Payer: Self-pay | Attending: Emergency Medicine | Admitting: Emergency Medicine

## 2015-01-19 DIAGNOSIS — Z792 Long term (current) use of antibiotics: Secondary | ICD-10-CM | POA: Insufficient documentation

## 2015-01-19 DIAGNOSIS — Z720 Tobacco use: Secondary | ICD-10-CM | POA: Insufficient documentation

## 2015-01-19 DIAGNOSIS — Z79899 Other long term (current) drug therapy: Secondary | ICD-10-CM | POA: Insufficient documentation

## 2015-01-19 DIAGNOSIS — J029 Acute pharyngitis, unspecified: Secondary | ICD-10-CM | POA: Insufficient documentation

## 2015-01-19 NOTE — ED Notes (Signed)
Per pt she developed body aches, sore throat and generalized weakness on Wednesday.  Pt presents today d/t persistent sore throat and painful swallowing.  RR is even and unlabored pt is speaking in full sentences.

## 2015-01-20 LAB — RAPID STREP SCREEN (MED CTR MEBANE ONLY): Streptococcus, Group A Screen (Direct): NEGATIVE

## 2015-01-20 MED ORDER — DEXAMETHASONE 4 MG PO TABS
12.0000 mg | ORAL_TABLET | Freq: Once | ORAL | Status: AC
  Administered 2015-01-20: 12 mg via ORAL
  Filled 2015-01-20: qty 3

## 2015-01-20 NOTE — ED Provider Notes (Signed)
CSN: [id number]     Arrival date & time 01/19/15  2312 History  By signing my name below, I, Wendy Howard, attest that this documentation has been prepared under the direction and in the presence of Delora Fuel, MD. Electronically Signed: Irene Howard, ED Scribe. 01/20/2015. 12:04 AM.    Chief Complaint  Patient presents with  . Sore Throat   The history is provided by the patient. No language interpreter was used.  HPI Comments: Wendy Howard is a 44 y.o. female who presents to the Emergency Department complaining of constant, gradually resolving sore throat onset 4 days ago. She states that the pain radiates to her left ear, non-productive cough, and is having trouble swallowing. She currently rates her pain 3/10. She denies current chills, fever, nausea, vomiting, diarrhea, or myalgias. She denies sick contacts.   Past Medical History  Diagnosis Date  . Medical history non-contributory    Past Surgical History  Procedure Laterality Date  . Knee surgery    . Knee surgery    . Knee surgery    . Dilation and evacuation N/A 05/02/2013    Procedure: SUCTION DILATATION AND EVACUATION;  Surgeon: Shelly Bombard, MD;  Location: [facility];  Service: Gynecology;  Laterality: N/A;   No family history on file. Social History  Substance Use Topics  . Smoking status: Current Every Day Smoker    Types: Cigarettes    Last Attempt to Quit: 03/03/2013  . Smokeless tobacco: Never Used  . Alcohol Use: Yes     Comment: occaisional    OB History    Gravida Para Term Preterm AB TAB SAB Ectopic Multiple Living   1              Review of Systems  Constitutional: Negative for fever and chills.  HENT: Positive for ear pain, sore throat and trouble swallowing.   Respiratory: Positive for cough.   Gastrointestinal: Negative for nausea, vomiting and diarrhea.  Musculoskeletal: Negative for myalgias.  All other systems reviewed and are negative.  Allergies  Other  Home Medications   Prior to  Admission medications   Medication Sig Start Date End Date Taking? Authorizing Provider  aspirin-acetaminophen-caffeine (EXCEDRIN MIGRAINE) [phone] MG per tablet Take 2 tablets by mouth every 6 (six) hours as needed for headache.    Historical Provider, MD  clotrimazole (LOTRIMIN) 1 % cream Apply to affected area 2 times daily 10/21/14   Charlann Lange, PA-C  metroNIDAZOLE (METROGEL VAGINAL) 0.75 % vaginal gel Place 1 Applicatorful vaginally 2 (two) times daily. 10/21/14   Shari Upstill, PA-C   BP 125/77 mmHg  Pulse 102  Temp(Src) 98.9 F (37.2 C) (Oral)  Resp 20  Ht 5\' 2"  (1.575 m)  Wt 165 lb (74.844 kg)  BMI 30.17 kg/m2  SpO2 98%  LMP 12/23/2014 (Exact Date)  Physical Exam  Constitutional: She is oriented to person, place, and time. She appears well-developed and well-nourished.  HENT:  Head: Normocephalic and atraumatic.  Slight exudate present on left tonsils. No erythema or tonsillar hypertrophy. There is no pooling of secretions and phonation is normal.  Eyes: EOM are normal. Pupils are equal, round, and reactive to light.  Neck: Normal range of motion. Neck supple. No JVD present.  Cardiovascular: Normal rate, regular rhythm and normal heart sounds.  Exam reveals no gallop and no friction rub.   No murmur heard. Pulmonary/Chest: Effort normal and breath sounds normal. She has no wheezes. She has no rales. She exhibits no tenderness.  Abdominal: Soft. Bowel  sounds are normal. She exhibits no distension and no mass. There is no tenderness.  Musculoskeletal: Normal range of motion. She exhibits no edema.  Lymphadenopathy:    She has no cervical adenopathy.  Neurological: She is alert and oriented to person, place, and time. No cranial nerve deficit. She exhibits normal muscle tone. Coordination normal.  Skin: Skin is warm and dry. No rash noted.  Psychiatric: She has a normal mood and affect. Her behavior is normal. Judgment and thought content normal.  Nursing note and vitals  reviewed.   ED Course  Procedures (including critical care time) DIAGNOSTIC STUDIES: Oxygen Saturation is 98% on RA, normal by my interpretation.    COORDINATION OF CARE: 12:08 AM-Discussed treatment plan which includes strep screen with pt at bedside and pt agreed to plan.    Labs Review Results for orders placed or performed during the hospital encounter of 01/19/15  Rapid strep screen (not at [facility])  Result Value Ref Range   Streptococcus, Group A Screen (Direct) NEGATIVE NEGATIVE   I have personally reviewed and evaluated these images and lab results as part of my medical decision-making.   MDM   Final diagnoses:  Pharyngitis    Pharyngitis which is probably viral. Strep screen is obtained and is negative. Culture pending. She's given a dose of dexamethasone.  I, Prosperity Darrough, personally performed the services described in this documentation. All medical record entries made by the scribe were at my direction and in my presence.  I have reviewed the chart and discharge instructions and agree that the record reflects my personal performance and is accurate and complete. [name].  [id number]/[id number]. 12:22 AM.      Delora Fuel, MD 19/50/93 2671

## 2015-01-20 NOTE — Discharge Instructions (Signed)
We will contact you if your culture is positive.  Sore Throat A sore throat is pain, burning, irritation, or scratchiness of the throat. There is often pain or tenderness when swallowing or talking. A sore throat may be accompanied by other symptoms, such as coughing, sneezing, fever, and swollen neck glands. A sore throat is often the first sign of another sickness, such as a cold, flu, strep throat, or mononucleosis (commonly known as mono). Most sore throats go away without medical treatment. CAUSES  The most common causes of a sore throat include:  A viral infection, such as a cold, flu, or mono.  A bacterial infection, such as strep throat, tonsillitis, or whooping cough.  Seasonal allergies.  Dryness in the air.  Irritants, such as smoke or pollution.  Gastroesophageal reflux disease (GERD). HOME CARE INSTRUCTIONS   Only take over-the-counter medicines as directed by your caregiver.  Drink enough fluids to keep your urine clear or pale yellow.  Rest as needed.  Try using throat sprays, lozenges, or sucking on hard candy to ease any pain (if older than 4 years or as directed).  Sip warm liquids, such as broth, herbal tea, or warm water with honey to relieve pain temporarily. You may also eat or drink cold or frozen liquids such as frozen ice pops.  Gargle with salt water (mix 1 tsp salt with 8 oz of water).  Do not smoke and avoid secondhand smoke.  Put a cool-mist humidifier in your bedroom at night to moisten the air. You can also turn on a hot shower and sit in the bathroom with the door closed for 5-10 minutes. SEEK IMMEDIATE MEDICAL CARE IF:  You have difficulty breathing.  You are unable to swallow fluids, soft foods, or your saliva.  You have increased swelling in the throat.  Your sore throat does not get better in 7 days.  You have nausea and vomiting.  You have a fever or persistent symptoms for more than 2-3 days.  You have a fever and your symptoms  suddenly get worse. MAKE SURE YOU:   Understand these instructions.  Will watch your condition.  Will get help right away if you are not doing well or get worse.   This information is not intended to replace advice given to you by your health care provider. Make sure you discuss any questions you have with your health care provider.   Document Released: 04/24/2004 Document Revised: 04/07/2014 Document Reviewed: 11/23/2011 Elsevier Interactive Patient Education [employer].

## 2015-01-22 LAB — CULTURE, GROUP A STREP

## 2015-05-08 ENCOUNTER — Ambulatory Visit (INDEPENDENT_AMBULATORY_CARE_PROVIDER_SITE_OTHER): Payer: BLUE CROSS/BLUE SHIELD | Admitting: Obstetrics

## 2015-05-08 ENCOUNTER — Encounter: Payer: Self-pay | Admitting: Obstetrics

## 2015-05-08 VITALS — BP 109/78 | HR 91 | Temp 98.2°F | Wt 168.0 lb

## 2015-05-08 DIAGNOSIS — Z01419 Encounter for gynecological examination (general) (routine) without abnormal findings: Secondary | ICD-10-CM

## 2015-05-08 DIAGNOSIS — Z3169 Encounter for other general counseling and advice on procreation: Secondary | ICD-10-CM

## 2015-05-08 DIAGNOSIS — Z1239 Encounter for other screening for malignant neoplasm of breast: Secondary | ICD-10-CM

## 2015-05-08 MED ORDER — PRENATAL PLUS IRON 29-1 MG PO TABS: 1.0000 | ORAL_TABLET | Freq: Every day | ORAL | Status: DC

## 2015-05-08 NOTE — Progress Notes (Signed)
Subjective:        Wendy Howard is a 45 y.o. female here for a routine exam.  Current complaints: None.    Personal health questionnaire:  Is patient [name], have a family history of breast and/or ovarian cancer: no Is there a family history of uterine cancer diagnosed at age < 23, gastrointestinal cancer, urinary tract cancer, family member who is a Field seismologist syndrome-associated carrier: no Is the patient overweight and hypertensive, family history of diabetes, personal history of gestational diabetes, preeclampsia or PCOS: no Is patient over 90, have PCOS,  family history of premature CHD under age 41, diabetes, smoke, have hypertension or peripheral artery disease:  no At any time, has a partner hit, kicked or otherwise hurt or frightened you?: no Over the past 2 weeks, have you felt down, depressed or hopeless?: no Over the past 2 weeks, have you felt little interest or pleasure in doing things?:no   Gynecologic History Patient's last menstrual period was 05/01/2015. Contraception: none Last Pap: 2015. Results were: normal Last mammogram: none. Results were: none  Obstetric History OB History  Gravida Para Term Preterm AB SAB TAB Ectopic Multiple Living  1             # Outcome Date GA Lbr Len/2nd Weight Sex Delivery Anes PTL Lv  1 Gravida              Comments: System Generated. Please review and update pregnancy details.      Past Medical History  Diagnosis Date  . Medical history non-contributory     Past Surgical History  Procedure Laterality Date  . Knee surgery    . Knee surgery    . Knee surgery    . Dilation and evacuation N/A 05/02/2013    Procedure: SUCTION DILATATION AND EVACUATION;  Surgeon: Shelly Bombard, MD;  Location: [facility];  Service: Gynecology;  Laterality: N/A;     Current outpatient prescriptions:  .  Prenatal Vit-Iron Carbonyl-FA (PRENATAL PLUS IRON) 29-1 MG TABS, Take 1 tablet by mouth daily before breakfast., Disp: 30 tablet, Rfl:  11 Allergies  Allergen Reactions  . Other Hives    Seafood-hives    Social History  Substance Use Topics  . Smoking status: Current Every Day Smoker    Types: Cigarettes    Last Attempt to Quit: 03/03/2013  . Smokeless tobacco: Never Used  . Alcohol Use: 0.0 oz/week    0 Standard drinks or equivalent per week     Comment: occaisional     History reviewed. No pertinent family history.    Review of Systems  Constitutional: negative for fatigue and weight loss Respiratory: negative for cough and wheezing Cardiovascular: negative for chest pain, fatigue and palpitations Gastrointestinal: negative for abdominal pain and change in bowel habits Musculoskeletal:negative for myalgias Neurological: negative for gait problems and tremors Behavioral/Psych: negative for abusive relationship, depression Endocrine: negative for temperature intolerance   Genitourinary:negative for abnormal menstrual periods, genital lesions, hot flashes, sexual problems and vaginal discharge Integument/breast: negative for breast lump, breast tenderness, nipple discharge and skin lesion(s)    Objective:       BP 109/78 mmHg  Pulse 91  Temp(Src) 98.2 F (36.8 C)  Wt 168 lb (76.204 kg)  LMP 05/01/2015 General:   alert  Skin:   no rash or abnormalities  Lungs:   clear to auscultation bilaterally  Heart:   regular rate and rhythm, S1, S2 normal, no murmur, click, rub or gallop  Breasts:   normal  without suspicious masses, skin or nipple changes or axillary nodes  Abdomen:  normal findings: no organomegaly, soft, non-tender and no hernia  Pelvis:  External genitalia: normal general appearance Urinary system: urethral meatus normal and bladder without fullness, nontender Vaginal: normal without tenderness, induration or masses Cervix: normal appearance Adnexa: normal bimanual exam Uterus: anteverted and non-tender, normal size   Lab Review Urine pregnancy test Labs reviewed yes Radiologic studies  reviewed yes    Assessment:    Healthy female exam.    Preconception counseling and advice.  Wants to conceive.   Plan:      Education reviewed: calcium supplements, low fat, low cholesterol diet, safe sex/STD prevention, self breast exams and weight bearing exercise. Contraception: none. Follow up in: 1 year.  Mammogram ordered [facility]'s Rx  Meds ordered this encounter  Medications  . Prenatal Vit-Iron Carbonyl-FA (PRENATAL PLUS IRON) 29-1 MG TABS    Sig: Take 1 tablet by mouth daily before breakfast.    Dispense:  30 tablet    Refill:  11   Orders Placed This Encounter  Procedures  . MM Digital Screening    Standing Status: Future     Number of Occurrences:      Standing Expiration Date: 07/05/2016    Order Specific Question:  Reason for Exam (SYMPTOM  OR DIAGNOSIS REQUIRED)    Answer:  Screening    Order Specific Question:  Is the patient pregnant?    Answer:  No    Order Specific Question:  Preferred imaging location?    Answer:  [facility]

## 2015-05-08 NOTE — Addendum Note (Signed)
Addended by: Lewie Loron D on: 05/08/2015 04:25 PM   Modules accepted: Orders

## 2015-05-10 LAB — PAP, TP IMAGING W/ HPV RNA, RFLX HPV TYPE 16,18/45: HPV MRNA, HIGH RISK: NOT DETECTED

## 2015-05-11 ENCOUNTER — Other Ambulatory Visit: Payer: Self-pay | Admitting: Obstetrics

## 2015-05-11 DIAGNOSIS — B9689 Other specified bacterial agents as the cause of diseases classified elsewhere: Secondary | ICD-10-CM

## 2015-05-11 DIAGNOSIS — N760 Acute vaginitis: Principal | ICD-10-CM

## 2015-05-11 LAB — SURESWAB BACTERIAL VAGINOSIS/ITIS
ATOPOBIUM VAGINAE: NOT DETECTED Log (cells/mL)
C. ALBICANS, DNA: NOT DETECTED
C. PARAPSILOSIS, DNA: NOT DETECTED
C. glabrata, DNA: NOT DETECTED
C. tropicalis, DNA: NOT DETECTED
Gardnerella vaginalis: 6.9 Log (cells/mL)
LACTOBACILLUS SPECIES: NOT DETECTED Log (cells/mL)
MEGASPHAERA SPECIES: 6.9 Log (cells/mL)
T. vaginalis RNA, QL TMA: NOT DETECTED

## 2015-05-11 MED ORDER — TINIDAZOLE 500 MG PO TABS: 1000.0000 mg | ORAL_TABLET | Freq: Every day | ORAL | Status: DC

## 2015-05-21 ENCOUNTER — Other Ambulatory Visit: Payer: Self-pay | Admitting: *Deleted

## 2015-05-21 DIAGNOSIS — B9689 Other specified bacterial agents as the cause of diseases classified elsewhere: Secondary | ICD-10-CM

## 2015-05-21 DIAGNOSIS — N760 Acute vaginitis: Principal | ICD-10-CM

## 2015-05-21 MED ORDER — METRONIDAZOLE 500 MG PO TABS: 500.0000 mg | ORAL_TABLET | Freq: Two times a day (BID) | ORAL | Status: DC

## 2015-05-21 NOTE — Progress Notes (Signed)
Patient's insurance doesn't cover Tindamax. Medication has been changed to Metronidazole.

## 2015-06-07 ENCOUNTER — Other Ambulatory Visit: Payer: Self-pay

## 2015-06-07 DIAGNOSIS — Z1231 Encounter for screening mammogram for malignant neoplasm of breast: Secondary | ICD-10-CM

## 2015-06-20 ENCOUNTER — Ambulatory Visit
Admission: RE | Admit: 2015-06-20 | Discharge: 2015-06-20 | Disposition: A | Discharge status: Home / Self Care | Payer: BLUE CROSS/BLUE SHIELD | Source: Ambulatory Visit

## 2015-06-20 DIAGNOSIS — Z1231 Encounter for screening mammogram for malignant neoplasm of breast: Secondary | ICD-10-CM

## 2016-05-08 ENCOUNTER — Ambulatory Visit: Payer: Self-pay | Admitting: Obstetrics

## 2016-05-22 ENCOUNTER — Ambulatory Visit: Payer: BLUE CROSS/BLUE SHIELD | Admitting: Obstetrics

## 2016-06-05 ENCOUNTER — Ambulatory Visit (INDEPENDENT_AMBULATORY_CARE_PROVIDER_SITE_OTHER): Payer: BLUE CROSS/BLUE SHIELD | Admitting: Obstetrics

## 2016-06-05 ENCOUNTER — Encounter: Payer: Self-pay | Admitting: Obstetrics

## 2016-06-05 ENCOUNTER — Other Ambulatory Visit: Payer: Self-pay | Admitting: Obstetrics

## 2016-06-05 VITALS — BP 125/83 | HR 93 | Ht 62.0 in | Wt 169.0 lb

## 2016-06-05 DIAGNOSIS — Z1239 Encounter for other screening for malignant neoplasm of breast: Secondary | ICD-10-CM

## 2016-06-05 DIAGNOSIS — Z01419 Encounter for gynecological examination (general) (routine) without abnormal findings: Secondary | ICD-10-CM | POA: Diagnosis not present

## 2016-06-05 DIAGNOSIS — Z1231 Encounter for screening mammogram for malignant neoplasm of breast: Secondary | ICD-10-CM

## 2016-06-05 DIAGNOSIS — Z124 Encounter for screening for malignant neoplasm of cervix: Secondary | ICD-10-CM

## 2016-06-05 DIAGNOSIS — Z0000 Encounter for general adult medical examination without abnormal findings: Secondary | ICD-10-CM

## 2016-06-05 NOTE — Progress Notes (Signed)
Pt presents for Annual and pap smear. Pt declines STD testing. Pt due for mgm.

## 2016-06-06 LAB — CERVICOVAGINAL ANCILLARY ONLY
Bacterial vaginitis: POSITIVE — AB
Candida vaginitis: NEGATIVE

## 2016-06-07 ENCOUNTER — Other Ambulatory Visit: Payer: Self-pay | Admitting: Obstetrics

## 2016-06-07 ENCOUNTER — Encounter: Payer: Self-pay | Admitting: Obstetrics

## 2016-06-07 DIAGNOSIS — N760 Acute vaginitis: Principal | ICD-10-CM

## 2016-06-07 DIAGNOSIS — B9689 Other specified bacterial agents as the cause of diseases classified elsewhere: Secondary | ICD-10-CM

## 2016-06-07 MED ORDER — TINIDAZOLE 500 MG PO TABS: 1000.0000 mg | ORAL_TABLET | Freq: Every day | ORAL | 2 refills | Status: DC

## 2016-06-07 NOTE — Progress Notes (Signed)
Subjective:        Wendy Howard is a 46 y.o. female here for a routine exam.  Current complaints: None.    Personal health questionnaire:  Is patient [name], have a family history of breast and/or ovarian cancer: no Is there a family history of uterine cancer diagnosed at age < 37, gastrointestinal cancer, urinary tract cancer, family member who is a Field seismologist syndrome-associated carrier: no Is the patient overweight and hypertensive, family history of diabetes, personal history of gestational diabetes, preeclampsia or PCOS: no Is patient over 34, have PCOS,  family history of premature CHD under age 66, diabetes, smoke, have hypertension or peripheral artery disease:  no At any time, has a partner hit, kicked or otherwise hurt or frightened you?: no Over the past 2 weeks, have you felt down, depressed or hopeless?: no Over the past 2 weeks, have you felt little interest or pleasure in doing things?:no   Gynecologic History Patient's last menstrual period was 05/19/2016. Contraception: none Last Pap: 2017. Results were: normal Last mammogram: unknown. Results were: unknown  Obstetric History OB History  Gravida Para Term Preterm AB Living  2       1    SAB TAB Ectopic Multiple Live Births  1            # Outcome Date GA Lbr Len/2nd Weight Sex Delivery Anes PTL Lv  2 SAB           1 Gravida              Birth Comments: System Generated. Please review and update pregnancy details.      Past Medical History:  Diagnosis Date  . Medical history non-contributory     Past Surgical History:  Procedure Laterality Date  . DILATION AND EVACUATION N/A 05/02/2013   Procedure: SUCTION DILATATION AND EVACUATION;  Surgeon: Shelly Bombard, MD;  Location: [facility];  Service: Gynecology;  Laterality: N/A;  . KNEE SURGERY    . KNEE SURGERY    . KNEE SURGERY       Current Outpatient Prescriptions:  .  metroNIDAZOLE (FLAGYL) 500 MG tablet, Take 1 tablet (500 mg total) by mouth 2  (two) times daily. (Patient not taking: Reported on 06/05/2016), Disp: 14 tablet, Rfl: 2 .  Prenatal Vit-Iron Carbonyl-FA (PRENATAL PLUS IRON) 29-1 MG TABS, Take 1 tablet by mouth daily before breakfast. (Patient not taking: Reported on 06/05/2016), Disp: 30 tablet, Rfl: 11 .  tinidazole (TINDAMAX) 500 MG tablet, Take 2 tablets (1,000 mg total) by mouth daily with breakfast., Disp: 10 tablet, Rfl: 2 Allergies  Allergen Reactions  . Other Hives    Seafood-hives    Social History  Substance Use Topics  . Smoking status: Current Every Day Smoker    Types: Cigarettes  . Smokeless tobacco: Never Used  . Alcohol use 0.0 oz/week     Comment: occaisional     History reviewed. No pertinent family history.    Review of Systems  Constitutional: negative for fatigue and weight loss Respiratory: negative for cough and wheezing Cardiovascular: negative for chest pain, fatigue and palpitations Gastrointestinal: negative for abdominal pain and change in bowel habits Musculoskeletal:negative for myalgias Neurological: negative for gait problems and tremors Behavioral/Psych: negative for abusive relationship, depression Endocrine: negative for temperature intolerance    Genitourinary:negative for abnormal menstrual periods, genital lesions, hot flashes, sexual problems and vaginal discharge Integument/breast: negative for breast lump, breast tenderness, nipple discharge and skin lesion(s)    Objective:  BP 125/83   Pulse 93   Ht 5\' 2"  (1.575 m)   Wt 169 lb (76.7 kg)   LMP 05/19/2016   BMI 30.91 kg/m  General:   alert  Skin:   no rash or abnormalities  Lungs:   clear to auscultation bilaterally  Heart:   regular rate and rhythm, S1, S2 normal, no murmur, click, rub or gallop  Breasts:   normal without suspicious masses, skin or nipple changes or axillary nodes  Abdomen:  normal findings: no organomegaly, soft, non-tender and no hernia  Pelvis:  External genitalia: normal general  appearance Urinary system: urethral meatus normal and bladder without fullness, nontender Vaginal: normal without tenderness, induration or masses Cervix: normal appearance Adnexa: normal bimanual exam Uterus: anteverted and non-tender, normal size   Lab Review Urine pregnancy test Labs reviewed yes Radiologic studies reviewed yes  50% of 20 min visit spent on counseling and coordination of care.    Assessment:    Healthy female exam.    Plan:    Education reviewed: calcium supplements, low fat, low cholesterol diet, safe sex/STD prevention, self breast exams, smoking cessation and weight bearing exercise. Mammogram ordered. Follow up in: 1 year.   No orders of the defined types were placed in this encounter.  Orders Placed This Encounter  Procedures  . MM Digital Screening    Standing Status:   Future    Standing Expiration Date:   08/05/2017    Order Specific Question:   Reason for Exam (SYMPTOM  OR DIAGNOSIS REQUIRED)    Answer:   screening    Order Specific Question:   Is the patient pregnant?    Answer:   No    Order Specific Question:   Preferred imaging location?    Answer:   [facility]     Patient ID: Jennise Both, female   DOB: [birth year], 46 y.o.   MRN: [id number]

## 2016-06-09 LAB — CYTOLOGY - PAP
Diagnosis: NEGATIVE
HPV: NOT DETECTED

## 2016-06-20 DIAGNOSIS — H524 Presbyopia: Secondary | ICD-10-CM | POA: Diagnosis not present

## 2016-06-23 ENCOUNTER — Ambulatory Visit: Payer: BLUE CROSS/BLUE SHIELD

## 2016-07-01 DIAGNOSIS — F17210 Nicotine dependence, cigarettes, uncomplicated: Secondary | ICD-10-CM | POA: Insufficient documentation

## 2016-07-01 DIAGNOSIS — Z79899 Other long term (current) drug therapy: Secondary | ICD-10-CM | POA: Diagnosis not present

## 2016-07-01 DIAGNOSIS — N898 Other specified noninflammatory disorders of vagina: Secondary | ICD-10-CM | POA: Diagnosis not present

## 2016-07-02 ENCOUNTER — Encounter (HOSPITAL_COMMUNITY): Payer: Self-pay

## 2016-07-02 ENCOUNTER — Emergency Department (HOSPITAL_COMMUNITY)
Admission: EM | Admit: 2016-07-02 | Discharge: 2016-07-02 | Disposition: A | Discharge status: Home / Self Care | Payer: BLUE CROSS/BLUE SHIELD | Attending: Emergency Medicine | Admitting: Emergency Medicine

## 2016-07-02 DIAGNOSIS — N898 Other specified noninflammatory disorders of vagina: Secondary | ICD-10-CM

## 2016-07-02 LAB — WET PREP, GENITAL
Clue Cells Wet Prep HPF POC: NONE SEEN
SPERM: NONE SEEN
TRICH WET PREP: NONE SEEN
YEAST WET PREP: NONE SEEN

## 2016-07-02 LAB — URINALYSIS, ROUTINE W REFLEX MICROSCOPIC
Bilirubin Urine: NEGATIVE
Glucose, UA: NEGATIVE mg/dL
Ketones, ur: NEGATIVE mg/dL
Nitrite: NEGATIVE
PROTEIN: NEGATIVE mg/dL
Specific Gravity, Urine: 1.028 (ref 1.005–1.030)
pH: 5 (ref 5.0–8.0)

## 2016-07-02 LAB — GC/CHLAMYDIA PROBE AMP (~~LOC~~) NOT AT ARMC
Chlamydia: NEGATIVE
Neisseria Gonorrhea: NEGATIVE

## 2016-07-02 MED ORDER — FLUCONAZOLE 200 MG PO TABS: 200.0000 mg | ORAL_TABLET | Freq: Once | ORAL | 0 refills | Status: AC

## 2016-07-02 MED ORDER — FLUCONAZOLE 200 MG PO TABS
200.0000 mg | ORAL_TABLET | Freq: Once | ORAL | Status: AC
  Administered 2016-07-02: 200 mg via ORAL
  Filled 2016-07-02: qty 1

## 2016-07-02 NOTE — ED Notes (Signed)
Bed: WA06 Expected date:  Expected time:  Means of arrival:  Comments: 

## 2016-07-02 NOTE — ED Notes (Signed)
ED Provider at bedside. 

## 2016-07-02 NOTE — ED Provider Notes (Signed)
[facility]end[facility] DEPT Provider Note   CSN: [id number] Arrival date & time: 07/01/16  2306    By signing my name below, I, Wendy Howard, attest that this documentation has been prepared under the direction and in the presence of [facility], PA-C. Electronically Signed: Macon Howard, ED Scribe. 07/02/16. 1:57 AM.  History   Chief Complaint Chief Complaint  Patient presents with  . Vaginal Itching   The history is provided by the patient. No language interpreter was used.   HPI Comments: Wendy Howard is a 46 y.o. female who presents to the Emergency Department complaining of gradual onset, constant, vaginal itching that occurred three days ago. Pt reports associated "thick, cottage-like" discharge. She reports using OTC Monostat with minimal relief. She states her vaginal discharge was relieved with the cream but the itching has remained. Pt notes she currently uses a dial soap, but typically uses dove with no complications. She is concerned that change in soap could have caused yeast infection. Per pt, she reports being diagnosed for bacterial vaginosis ~a month ago and was placed on ABX. No alleviating factors noted for vaginal itching. Pt deneis abdominal pain, dysuria, fever.   Past Medical History:  Diagnosis Date  . Medical history non-contributory     Patient Active Problem List   Diagnosis Date Noted  . BV (bacterial vaginosis) 07/02/2013  . Missed abortion 05/16/2013  . IUFD (intrauterine fetal death) 05/22/13  . Pregnancy with inconclusive fetal viability 04/11/2013  . Candidiasis of vulva and vagina 04/11/2013  . Supervision of high-risk pregnancy of elderly primigravida 03/14/2013    Past Surgical History:  Procedure Laterality Date  . DILATION AND EVACUATION N/A 05/02/2013   Procedure: SUCTION DILATATION AND EVACUATION;  Surgeon: Shelly Bombard, MD;  Location: [facility];  Service: Gynecology;  Laterality: N/A;  . KNEE SURGERY    . KNEE SURGERY    . KNEE  SURGERY      OB History    Gravida Para Term Preterm AB Living   2       1     SAB TAB Ectopic Multiple Live Births   1               Home Medications    Prior to Admission medications   Medication Sig Start Date End Date Taking? Authorizing Provider  fluconazole (DIFLUCAN) 200 MG tablet Take 1 tablet (200 mg total) by mouth once. Take 1 tablet (200 mg total) by mouth once in 1 week if symptoms are not resolved. 07/02/16 07/02/16  Ozella Almond Jeriel Vivanco, PA-C  metroNIDAZOLE (FLAGYL) 500 MG tablet Take 1 tablet (500 mg total) by mouth 2 (two) times daily. Patient not taking: Reported on 06/05/2016 05/21/15   Shelly Bombard, MD  Prenatal Vit-Iron Carbonyl-FA (PRENATAL PLUS IRON) 29-1 MG TABS Take 1 tablet by mouth daily before breakfast. Patient not taking: Reported on 06/05/2016 05/08/15   Shelly Bombard, MD  tinidazole [facility]) 500 MG tablet Take 2 tablets (1,000 mg total) by mouth daily with breakfast. 06/07/16   Shelly Bombard, MD    Family History History reviewed. No pertinent family history.  Social History Social History  Substance Use Topics  . Smoking status: Current Every Day Smoker    Types: Cigarettes  . Smokeless tobacco: Never Used  . Alcohol use 0.0 oz/week     Comment: occaisional      Allergies   Other   Review of Systems Review of Systems  Constitutional: Negative for fever.  Respiratory: Negative for  shortness of breath.   Cardiovascular: Negative for chest pain.  Gastrointestinal: Negative for abdominal pain.  Genitourinary: Positive for vaginal discharge. Negative for dysuria.       +vaginal itching  All other systems reviewed and are negative.    Physical Exam Updated Vital Signs BP 101/62 (BP Location: Left Arm)   Pulse 81   Temp 98.8 F (37.1 C) (Oral)   Resp 18   Ht 5\' 2"  (1.575 m)   Wt 76.7 kg   LMP 06/11/2016   SpO2 95%   BMI 30.91 kg/m   Physical Exam  Constitutional: She is oriented to person, place, and time. She appears  well-developed and well-nourished. No distress.  HENT:  Head: Normocephalic and atraumatic.  Cardiovascular: Normal rate, regular rhythm and normal heart sounds.   No murmur heard. Pulmonary/Chest: Effort normal and breath sounds normal. No respiratory distress.  Abdominal: Soft. She exhibits no distension. There is no tenderness.  No CVA or abdominal tenderness.   Genitourinary:  Genitourinary Comments: Chaperone present for exam. Howard amount of thick white discharge. No CMT. No adnexal masses, tenderness, or fullness. No bleeding within vaginal vault.  Neurological: She is alert and oriented to person, place, and time.  Skin: Skin is warm and dry.  Nursing note and vitals reviewed.    ED Treatments / Results   DIAGNOSTIC STUDIES: Oxygen Saturation is 98% on RA, normal by my interpretation.    COORDINATION OF CARE: 1:41 AM Discussed treatment plan with pt at bedside which includes pelvic exam and pt agreed to plan.   Labs (all labs ordered are listed, but only abnormal results are displayed) Labs Reviewed  WET PREP, GENITAL - Abnormal; Notable for the following:       Result Value   WBC, Wet Prep HPF POC MODERATE (*)    All other components within normal limits  URINALYSIS, ROUTINE W REFLEX MICROSCOPIC - Abnormal; Notable for the following:    APPearance HAZY (*)    Hgb urine dipstick MODERATE (*)    Leukocytes, UA MODERATE (*)    Bacteria, UA RARE (*)    Squamous Epithelial / LPF 0-5 (*)    All other components within normal limits  GC/CHLAMYDIA PROBE AMP ([place]) NOT AT [facility]    EKG  EKG Interpretation None       Radiology No results found.  Procedures Procedures (including critical care time)  Medications Ordered in ED Medications  fluconazole (DIFLUCAN) tablet 200 mg (200 mg Oral Given 07/02/16 0329)     Initial Impression / Assessment and Plan / ED Course  I have reviewed the triage vital signs and the nursing notes.  Pertinent labs & imaging  results that were available during my care of the patient were reviewed by me and considered in my medical decision making (see chart for details).    Wendy Howard is a 46 y.o. female who presents to ED for vaginal itching and discharge. On exam, patient is afebrile, well appearing with no CVA or abdominal tenderness. Pelvic with no cervical motion or adnexal tenderness. She did have a Howard amount of thick white discharge consistent with yeast, however none seen on wet prep. Patient states that she often has yeast infections which improve with antifungal medication. She has recently been treated with antibiotics for BV which could predispose her for yeast infection. Given this and clinical exam findings, will treat with one dose of Diflucan and have patient follow up with PCP or OB/GYN. All questions answered.  Final Clinical Impressions(s) / ED Diagnoses   Final diagnoses:  Vaginal discharge    New Prescriptions Discharge Medication List as of 07/02/2016  3:10 AM    START taking these medications   Details  fluconazole (DIFLUCAN) 200 MG tablet Take 1 tablet (200 mg total) by mouth once. Take 1 tablet (200 mg total) by mouth once in 1 week if symptoms are not resolved., Starting Wed 07/02/2016, Print       I personally performed the services described in this documentation, which was scribed in my presence. The recorded information has been reviewed and is accurate.    [facility] Taron Conrey, PA-C 07/02/16 [place], MD 07/02/16 [phone]

## 2016-07-02 NOTE — ED Notes (Addendum)
Pelvic supplies at bedside. 

## 2016-07-02 NOTE — ED Notes (Signed)
Pt. States that she thinks she has a yeast infection

## 2016-07-02 NOTE — Discharge Instructions (Signed)
Please follow-up with your OB/GYN or the women's health clinic listed below. If symptoms are not resolved in one week, please take the one time dose of Diflucan given. Return to ER for new or worsening symptoms, any additional concerns.

## 2016-07-09 ENCOUNTER — Ambulatory Visit
Admission: RE | Admit: 2016-07-09 | Discharge: 2016-07-09 | Disposition: A | Discharge status: Home / Self Care | Payer: BLUE CROSS/BLUE SHIELD | Source: Ambulatory Visit | Attending: Obstetrics | Admitting: Obstetrics

## 2016-07-09 DIAGNOSIS — Z1231 Encounter for screening mammogram for malignant neoplasm of breast: Secondary | ICD-10-CM

## 2017-05-20 ENCOUNTER — Other Ambulatory Visit: Payer: Self-pay

## 2017-05-20 ENCOUNTER — Ambulatory Visit (INDEPENDENT_AMBULATORY_CARE_PROVIDER_SITE_OTHER): Payer: BLUE CROSS/BLUE SHIELD | Admitting: Physician Assistant

## 2017-05-20 ENCOUNTER — Encounter: Payer: Self-pay | Admitting: Physician Assistant

## 2017-05-20 VITALS — BP 124/82 | HR 95 | Temp 98.1°F | Resp 18 | Ht 62.99 in | Wt 171.0 lb

## 2017-05-20 DIAGNOSIS — Z7689 Persons encountering health services in other specified circumstances: Secondary | ICD-10-CM

## 2017-05-20 DIAGNOSIS — G43829 Menstrual migraine, not intractable, without status migrainosus: Secondary | ICD-10-CM | POA: Diagnosis not present

## 2017-05-20 MED ORDER — FROVATRIPTAN SUCCINATE 2.5 MG PO TABS: ORAL_TABLET | ORAL | 1 refills | Status: DC

## 2017-05-20 NOTE — Patient Instructions (Addendum)
Dosing of frovatriptan: Adult Dosing .  Dosage forms: TAB: 2.5 mg migraine headache, acute [2.5 mg PO x1] Max: 7.5 mg/24h; Info: may repeat dose x1 after 2h *menstrual migraine headache prophylaxis [2.5 mg PO qd-bid x6 days] Start: 2 days prior to onset of menses   I also recommend increasing water to at least 40-64 oz a day. Keep a headache journal throughout the month.   Return in 2 months for follow up and CPE.   Return sooner if symptoms worsen.   Migraine Headache A migraine headache is a very strong throbbing pain on one side or both sides of your head. Migraines can also cause other symptoms. Talk with your doctor about what things may bring on (trigger) your migraine headaches. Follow these instructions at home: Medicines  Take over-the-counter and prescription medicines only as told by your doctor.  Do not drive or use heavy machinery while taking prescription pain medicine.  To prevent or treat constipation while you are taking prescription pain medicine, your doctor may recommend that you: ? Drink enough fluid to keep your pee (urine) clear or pale yellow. ? Take over-the-counter or prescription medicines. ? Eat foods that are high in fiber. These include fresh fruits and vegetables, whole grains, and beans. ? Limit foods that are high in fat and processed sugars. These include fried and sweet foods. Lifestyle  Avoid alcohol.  Do not use any products that contain nicotine or tobacco, such as cigarettes and e-cigarettes. If you need help quitting, ask your doctor.  Get at least 8 hours of sleep every night.  Limit your stress. General instructions   Keep a journal to find out what may bring on your migraines. For example, write down: ? What you eat and drink. ? How much sleep you get. ? Any change in what you eat or drink. ? Any change in your medicines.  If you have a migraine: ? Avoid things that make your symptoms worse, such as bright lights. ? It may  help to lie down in a dark, quiet room. ? Do not drive or use heavy machinery. ? Ask your doctor what activities are safe for you.  Keep all follow-up visits as told by your doctor. This is important. Contact a doctor if:  You get a migraine that is different or worse than your usual migraines. Get help right away if:  Your migraine gets very bad.  You have a fever.  You have a stiff neck.  You have trouble seeing.  Your muscles feel weak or like you cannot control them.  You start to lose your balance a lot.  You start to have trouble walking.  You pass out (faint). This information is not intended to replace advice given to you by your health care provider. Make sure you discuss any questions you have with your health care provider. Document Released: 12/25/2007 Document Revised: 10/05/2015 Document Reviewed: 09/03/2015 Elsevier Interactive Patient Education  2018 [employer].   IF you received an x-ray today, you will receive an invoice from [facility]. Please contact [facility] at [phone] with questions or concerns regarding your invoice.   IF you received labwork today, you will receive an invoice from [place]. Please contact [facility] at [phone] with questions or concerns regarding your invoice.   Our billing staff will not be able to assist you with questions regarding bills from these companies.  You will be contacted with the lab results as soon as they are available. The fastest way to get  your results is to activate your My Chart account. Instructions are located on the last page of this paperwork. If you have not heard from [place] regarding the results in 2 weeks, please contact this office.

## 2017-05-20 NOTE — Progress Notes (Signed)
Wendy Howard  MRN: [id number] DOB: 04/02/1970  Subjective:  Wendy Howard is a 47 y.o. female seen in office today for a chief complaint to establish care and for headaches x 5 years.   Pt has never had a primary care provider.She is from [place]. Moved to [place] in 2008. Works as Company secretary at [employer].    Last annual was about one year ago.   Chronic Medical Conditions: 1) Allergic rhinitis: Controlled on loratadine.  2) Migraines:   Symptoms began about 5 years ago. Generally, the headaches last about 1 day and occur monthly at beginning and end of menstural cycle.The headaches are usually throbbing and are located in bandlike nature in front of head.  Work attendance or other daily activities are not affected by the headaches. Precipitating factors include: menses. The headaches are usually not preceded by an aura. Associated neurologic symptoms: none. The patient denies decreased physical activity, depression, dizziness, loss of balance, muscle weakness, numbness of extremities, speech difficulties, vision problems, vomiting in the early morning and worsening school/work performance. Home treatment has included excedrin, which will help. Other history includes: allergic rhinitis. Family history includes no known family members with significant headaches.  She is asymptomatic today.   Review of Systems  Constitutional: Negative for chills, diaphoresis and fever.  Eyes: Negative for visual disturbance.  Neurological: Negative for dizziness and headaches.    Patient Active Problem List   Diagnosis Date Noted  . BV (bacterial vaginosis) 07/02/2013  . Missed abortion 05/16/2013  . IUFD (intrauterine fetal death) May 03, 2013  . Pregnancy with inconclusive fetal viability 04/11/2013  . Candidiasis of vulva and vagina 04/11/2013  . Supervision of high-risk pregnancy of elderly primigravida 03/14/2013    Current Outpatient Medications on File Prior to Visit    Medication Sig Dispense Refill  . metroNIDAZOLE (FLAGYL) 500 MG tablet Take 1 tablet (500 mg total) by mouth 2 (two) times daily. (Patient not taking: Reported on 06/05/2016) 14 tablet 2  . Prenatal Vit-Iron Carbonyl-FA (PRENATAL PLUS IRON) 29-1 MG TABS Take 1 tablet by mouth daily before breakfast. (Patient not taking: Reported on 06/05/2016) 30 tablet 11  . tinidazole (TINDAMAX) 500 MG tablet Take 2 tablets (1,000 mg total) by mouth daily with breakfast. (Patient not taking: Reported on 05/20/2017) 10 tablet 2   No current facility-administered medications on file prior to visit.     Allergies  Allergen Reactions  . Other Hives    Seafood-hives     Objective:  BP 124/82 (BP Location: Left Arm, Patient Position: Sitting, Cuff Size: Normal)   Pulse 95   Temp 98.1 F (36.7 C) (Oral)   Resp 18   Ht 5' 2.99" (1.6 m)   Wt 171 lb (77.6 kg)   LMP 05/10/2017 (Exact Date)   SpO2 98%   Breastfeeding? No   BMI 30.30 kg/m   Physical Exam  Constitutional: She is oriented to person, place, and time and well-developed, well-nourished, and in no distress.  HENT:  Head: Normocephalic and atraumatic.  Eyes: Conjunctivae are normal.  Neck: Normal range of motion.  Pulmonary/Chest: Effort normal.  Neurological: She is alert and oriented to person, place, and time. She has normal strength, normal reflexes and intact cranial nerves. Gait normal.  Reflex Scores:      Tricep reflexes are 2+ on the right side and 2+ on the left side.      Bicep reflexes are 2+ on the right side and 2+ on the left side.  Brachioradialis reflexes are 2+ on the right side and 2+ on the left side.      Patellar reflexes are 2+ on the right side and 2+ on the left side.      Achilles reflexes are 2+ on the right side and 2+ on the left side. Skin: Skin is warm and dry.  Psychiatric: Affect normal.  Vitals reviewed.   Assessment and Plan :  1. Menstrual migraine without status migrainosus, not intractable History  consistent with menstrual migraines.  Patient is asymptomatic today.  Will attempt trial of frovatriptan.  Patient also encouraged to keep a headache journal.  Return in the next couple months for complete physical exam.  If no improvement at that time in monthly migraines, consider referral to headache clinic.  Return sooner if symptoms worsen  Or she develops new concerning symptoms. - frovatriptan (FROVA) 2.5 MG tablet; Start 2 days prior to onset of menses. Take 1 tablet by mouth twice daily until menses ends.  Dispense: 30 tablet; Refill: 1  2. Encounter to establish care We are happy to take over patient's care.  Recommended she follow-up in the next couple months for complete physical exam.   Tenna Delaine PA-C  [facility] 05/20/2017 4:16 PM

## 2017-06-02 ENCOUNTER — Other Ambulatory Visit: Payer: Self-pay | Admitting: Obstetrics

## 2017-06-02 DIAGNOSIS — Z1231 Encounter for screening mammogram for malignant neoplasm of breast: Secondary | ICD-10-CM

## 2017-06-12 ENCOUNTER — Ambulatory Visit (INDEPENDENT_AMBULATORY_CARE_PROVIDER_SITE_OTHER): Payer: BLUE CROSS/BLUE SHIELD | Admitting: Obstetrics

## 2017-06-12 ENCOUNTER — Encounter: Payer: Self-pay | Admitting: Obstetrics

## 2017-06-12 VITALS — BP 118/83 | HR 98 | Ht 62.0 in | Wt 171.0 lb

## 2017-06-12 DIAGNOSIS — B9689 Other specified bacterial agents as the cause of diseases classified elsewhere: Secondary | ICD-10-CM | POA: Diagnosis not present

## 2017-06-12 DIAGNOSIS — Z124 Encounter for screening for malignant neoplasm of cervix: Secondary | ICD-10-CM

## 2017-06-12 DIAGNOSIS — Z01419 Encounter for gynecological examination (general) (routine) without abnormal findings: Secondary | ICD-10-CM | POA: Diagnosis not present

## 2017-06-12 DIAGNOSIS — Z113 Encounter for screening for infections with a predominantly sexual mode of transmission: Secondary | ICD-10-CM | POA: Diagnosis not present

## 2017-06-12 DIAGNOSIS — Z1151 Encounter for screening for human papillomavirus (HPV): Secondary | ICD-10-CM | POA: Diagnosis not present

## 2017-06-12 DIAGNOSIS — N760 Acute vaginitis: Secondary | ICD-10-CM | POA: Diagnosis not present

## 2017-06-12 MED ORDER — TINIDAZOLE 500 MG PO TABS: 1000.0000 mg | ORAL_TABLET | Freq: Every day | ORAL | 6 refills | Status: DC

## 2017-06-12 MED ORDER — METRONIDAZOLE 500 MG PO TABS: 500.0000 mg | ORAL_TABLET | Freq: Two times a day (BID) | ORAL | 6 refills | Status: DC

## 2017-06-12 NOTE — Progress Notes (Signed)
Subjective:        Wendy Howard is a 47 y.o. female here for a routine exam.  Current complaints: None.    Personal health questionnaire:  Is patient [name], have a family history of breast and/or ovarian cancer: no Is there a family history of uterine cancer diagnosed at age < 60, gastrointestinal cancer, urinary tract cancer, family member who is a Field seismologist syndrome-associated carrier: no Is the patient overweight and hypertensive, family history of diabetes, personal history of gestational diabetes, preeclampsia or PCOS: no Is patient over 71, have PCOS,  family history of premature CHD under age 36, diabetes, smoke, have hypertension or peripheral artery disease:  no At any time, has a partner hit, kicked or otherwise hurt or frightened you?: no Over the past 2 weeks, have you felt down, depressed or hopeless?: no Over the past 2 weeks, have you felt little interest or pleasure in doing things?:no   Gynecologic History Patient's last menstrual period was 06/04/2017 (exact date). Contraception: none Last Pap: 2018. Results were: normal Last mammogram: 2018. Results were: normal  Obstetric History OB History  Gravida Para Term Preterm AB Living  2       1    SAB TAB Ectopic Multiple Live Births  1            # Outcome Date GA Lbr Len/2nd Weight Sex Delivery Anes PTL Lv  2 SAB           1 Gravida              Birth Comments: System Generated. Please review and update pregnancy details.      Past Medical History:  Diagnosis Date  . Medical history non-contributory   . Migraine     Past Surgical History:  Procedure Laterality Date  . DILATION AND EVACUATION N/A 05/02/2013   Procedure: SUCTION DILATATION AND EVACUATION;  Surgeon: Shelly Bombard, MD;  Location: [facility];  Service: Gynecology;  Laterality: N/A;  . KNEE SURGERY    . KNEE SURGERY    . KNEE SURGERY       Current Outpatient Medications:  .  frovatriptan (FROVA) 2.5 MG tablet, Start 2 days  prior to onset of menses. Take 1 tablet by mouth twice daily until menses ends. (Patient not taking: Reported on 06/12/2017), Disp: 30 tablet, Rfl: 1 .  Prenatal Vit-Iron Carbonyl-FA (PRENATAL PLUS IRON) 29-1 MG TABS, Take 1 tablet by mouth daily before breakfast. (Patient not taking: Reported on 06/05/2016), Disp: 30 tablet, Rfl: 11 .  tinidazole (TINDAMAX) 500 MG tablet, Take 2 tablets (1,000 mg total) by mouth daily with breakfast., Disp: 10 tablet, Rfl: 6 Allergies  Allergen Reactions  . Other Hives    Seafood-hives    Social History   Tobacco Use  . Smoking status: Current Every Day Smoker    Packs/day: 0.50    Types: Cigarettes  . Smokeless tobacco: Never Used  Substance Use Topics  . Alcohol use: Yes    Alcohol/week: 0.0 oz    Comment: occaisional     Family History  Problem Relation Age of Onset  . Hypertension Father   . Hypertension Maternal Grandmother       Review of Systems  Constitutional: negative for fatigue and weight loss Respiratory: negative for cough and wheezing Cardiovascular: negative for chest pain, fatigue and palpitations Gastrointestinal: negative for abdominal pain and change in bowel habits Musculoskeletal:negative for myalgias Neurological: negative for gait problems and tremors Behavioral/Psych: negative for abusive relationship,  depression Endocrine: negative for temperature intolerance    Genitourinary:negative for abnormal menstrual periods, genital lesions, hot flashes, sexual problems and vaginal discharge Integument/breast: negative for breast lump, breast tenderness, nipple discharge and skin lesion(s)    Objective:       BP 118/83   Pulse 98   Ht 5\' 2"  (1.575 m)   Wt 171 lb (77.6 kg)   LMP 06/04/2017 (Exact Date)   BMI 31.28 kg/m  General:   alert  Skin:   no rash or abnormalities  Lungs:   clear to auscultation bilaterally  Heart:   regular rate and rhythm, S1, S2 normal, no murmur, click, rub or gallop  Breasts:   normal  without suspicious masses, skin or nipple changes or axillary nodes  Abdomen:  normal findings: no organomegaly, soft, non-tender and no hernia  Pelvis:  External genitalia: normal general appearance Urinary system: urethral meatus normal and bladder without fullness, nontender Vaginal: normal without tenderness, induration or masses Cervix: normal appearance Adnexa: normal bimanual exam Uterus: anteverted and non-tender, normal size   Lab Review Urine pregnancy test Labs reviewed yes Radiologic studies reviewed yes  50% of 20 min visit spent on counseling and coordination of care.   Assessment:     1. Encounter for routine gynecological examination with Papanicolaou smear of cervix Rx: - Cytology - PAP - Cervicovaginal ancillary only  2. BV (bacterial vaginosis) Rx: - tinidazole (TINDAMAX) 500 MG tablet; Take 2 tablets (1,000 mg total) by mouth daily with breakfast.  Dispense: 10 tablet; Refill: 6    Plan:    Education reviewed: none. Contraception: abstinence. Follow up in: 2 years.   Meds ordered this encounter  Medications  . DISCONTD: metroNIDAZOLE (FLAGYL) 500 MG tablet    Sig: Take 1 tablet (500 mg total) by mouth 2 (two) times daily.    Dispense:  14 tablet    Refill:  6  . tinidazole (TINDAMAX) 500 MG tablet    Sig: Take 2 tablets (1,000 mg total) by mouth daily with breakfast.    Dispense:  10 tablet    Refill:  6   No orders of the defined types were placed in this encounter.   Shelly Bombard MD

## 2017-06-12 NOTE — Progress Notes (Signed)
LMP: 06/04/2017 Mammogram: 07/09/2016 WNL Appt scheduled   Last Pap:06/05/2016 WNL No Hx of abnormal paps  Colonoscopy: N/A  Contraception: None and none desired  PCP: [facility]   STD Screening: Declines  CC: None today

## 2017-06-15 LAB — CERVICOVAGINAL ANCILLARY ONLY
BACTERIAL VAGINITIS: POSITIVE — AB
CANDIDA VAGINITIS: NEGATIVE
Chlamydia: NEGATIVE
Neisseria Gonorrhea: NEGATIVE
TRICH (WINDOWPATH): NEGATIVE

## 2017-06-16 ENCOUNTER — Other Ambulatory Visit: Payer: Self-pay | Admitting: Obstetrics

## 2017-06-16 LAB — CYTOLOGY - PAP
DIAGNOSIS: NEGATIVE
HPV (WINDOPATH): NOT DETECTED

## 2017-07-06 DIAGNOSIS — H524 Presbyopia: Secondary | ICD-10-CM | POA: Diagnosis not present

## 2017-07-14 ENCOUNTER — Ambulatory Visit
Admission: RE | Admit: 2017-07-14 | Discharge: 2017-07-14 | Disposition: A | Discharge status: Home / Self Care | Payer: BLUE CROSS/BLUE SHIELD | Source: Ambulatory Visit | Attending: Obstetrics | Admitting: Obstetrics

## 2017-07-14 DIAGNOSIS — Z1231 Encounter for screening mammogram for malignant neoplasm of breast: Secondary | ICD-10-CM

## 2017-07-16 ENCOUNTER — Other Ambulatory Visit: Payer: Self-pay

## 2017-07-16 ENCOUNTER — Ambulatory Visit (INDEPENDENT_AMBULATORY_CARE_PROVIDER_SITE_OTHER): Payer: BLUE CROSS/BLUE SHIELD | Admitting: Physician Assistant

## 2017-07-16 ENCOUNTER — Encounter: Payer: Self-pay | Admitting: Physician Assistant

## 2017-07-16 VITALS — BP 112/72 | HR 95 | Temp 98.6°F | Resp 18 | Ht 63.27 in | Wt 169.2 lb

## 2017-07-16 DIAGNOSIS — Z130 Encounter for screening for diseases of the blood and blood-forming organs and certain disorders involving the immune mechanism: Secondary | ICD-10-CM | POA: Diagnosis not present

## 2017-07-16 DIAGNOSIS — Z13228 Encounter for screening for other metabolic disorders: Secondary | ICD-10-CM

## 2017-07-16 DIAGNOSIS — Z23 Encounter for immunization: Secondary | ICD-10-CM | POA: Diagnosis not present

## 2017-07-16 DIAGNOSIS — Z0000 Encounter for general adult medical examination without abnormal findings: Secondary | ICD-10-CM | POA: Diagnosis not present

## 2017-07-16 DIAGNOSIS — Z13220 Encounter for screening for lipoid disorders: Secondary | ICD-10-CM | POA: Diagnosis not present

## 2017-07-16 DIAGNOSIS — Z1329 Encounter for screening for other suspected endocrine disorder: Secondary | ICD-10-CM | POA: Diagnosis not present

## 2017-07-16 DIAGNOSIS — Z1389 Encounter for screening for other disorder: Secondary | ICD-10-CM | POA: Diagnosis not present

## 2017-07-16 LAB — POCT URINALYSIS DIP (MANUAL ENTRY)
BILIRUBIN UA: NEGATIVE
GLUCOSE UA: NEGATIVE mg/dL
Ketones, POC UA: NEGATIVE mg/dL
Leukocytes, UA: NEGATIVE
NITRITE UA: NEGATIVE
Protein Ur, POC: NEGATIVE mg/dL
Spec Grav, UA: 1.02 (ref 1.010–1.025)
UROBILINOGEN UA: 0.2 U/dL
pH, UA: 6 (ref 5.0–8.0)

## 2017-07-16 NOTE — Progress Notes (Signed)
Wendy Howard  MRN: [id number] DOB: [birth year]  Subjective:  Pt is a 47 y.o. female who presents for annual physical exam. Pt is fasting today.   Diet: has cut back on fried foods, changed bread to wheat bread. Trying to decrease eating out. Eats a variety of foods: omelettes, pizza, hamburgers, noodles, veggies. Drinks water and sweet tea. Does not take multivitamin daily. She is lactose intolerant.   Exercise: No structured exercise.   Menstrual cycles: Regular, occur monthly. Lasts 3 days. Denies dysmenorrhea and menorrhagia.   Sleep: 7-8 hours a night.   BM: Every other day. Feels like she is having to strain more.   Last dental exam: Years ago  Last vision exam: 05/2017, same Rx eyeglasses Last pap smear: 05/2017, normal Last mammogram: 06/2017  Vaccinations      Tetanus: >10 years ago.     Acute: Urine odor x 1 month. Told gyn about this but they did not check urine just did pap smear. Denies hematuria, urinary frequency, urgency, hesitancy, suprapubic pressure, vaginal irritation, and discharge. No concern for STD. Sexually active with monogamous partner. Drinks about 3 bottles of water a day.  Chronic issues: Menstrual migraines: Seen in office 05/2017. Given Rx for frovatriptan. Could not afford. Migraines have not been bad so did not return.   Patient Active Problem List   Diagnosis Date Noted  . BV (bacterial vaginosis) 07/02/2013  . Missed abortion 05/16/2013  . IUFD (intrauterine fetal death) May 08, 2013  . Pregnancy with inconclusive fetal viability 04/11/2013  . Candidiasis of vulva and vagina 04/11/2013  . Supervision of high-risk pregnancy of elderly primigravida 03/14/2013    Current Outpatient Medications on File Prior to Visit  Medication Sig Dispense Refill  . frovatriptan (FROVA) 2.5 MG tablet Start 2 days prior to onset of menses. Take 1 tablet by mouth twice daily until menses ends. 30 tablet 1  . Prenatal Vit-Iron Carbonyl-FA  (PRENATAL PLUS IRON) 29-1 MG TABS Take 1 tablet by mouth daily before breakfast. 30 tablet 11  . tinidazole (TINDAMAX) 500 MG tablet Take 2 tablets (1,000 mg total) by mouth daily with breakfast. 10 tablet 6   No current facility-administered medications on file prior to visit.     Allergies  Allergen Reactions  . Other Hives    Seafood-hives    Social History   Socioeconomic History  . Marital status: Married    Spouse name: Clifton James  . Number of children: 0  . Years of education: Not on file  . Highest education level: Not on file  Occupational History  . Occupation: Heritage manager   Social Needs  . Financial resource strain: Not hard at all  . Food insecurity:    Worry: Never true    Inability: Never true  . Transportation needs:    Medical: No    Non-medical: No  Tobacco Use  . Smoking status: Current Every Day Smoker    Packs/day: 0.50    Types: Cigarettes  . Smokeless tobacco: Never Used  Substance and Sexual Activity  . Alcohol use: Yes    Alcohol/week: 0.0 oz    Comment: occaisional   . Drug use: No  . Sexual activity: Yes    Partners: Male    Birth control/protection: None    Comment: monagamous partner   Lifestyle  . Physical activity:    Days per week: 0 days    Minutes per session: 0 min  . Stress: Only a little  Relationships  .  Social connections:    Talks on phone: Twice a week    Gets together: Never    Attends religious service: Never    Active member of club or organization: No    Attends meetings of clubs or organizations: Never    Relationship status: Married  Other Topics Concern  . Not on file  Social History Narrative   She is married. Lives at home with husband.     Past Surgical History:  Procedure Laterality Date  . DILATION AND EVACUATION N/A 05/02/2013   Procedure: SUCTION DILATATION AND EVACUATION;  Surgeon: Shelly Bombard, MD;  Location: [facility];  Service: Gynecology;  Laterality: N/A;  . KNEE  SURGERY    . KNEE SURGERY    . KNEE SURGERY      Family History  Problem Relation Age of Onset  . Hypertension Father   . Hypertension Maternal Grandmother     Review of Systems  Constitutional: Negative for activity change, appetite change, chills, diaphoresis, fatigue, fever and unexpected weight change.  HENT: Negative for congestion, dental problem, drooling, ear discharge, ear pain, facial swelling, hearing loss, mouth sores, nosebleeds, postnasal drip, rhinorrhea, sinus pressure, sinus pain, sneezing, sore throat, tinnitus, trouble swallowing and voice change.   Eyes: Negative for photophobia, pain, discharge, redness, itching and visual disturbance.  Respiratory: Negative for apnea, cough, choking, chest tightness, shortness of breath, wheezing and stridor.   Cardiovascular: Negative for chest pain, palpitations and leg swelling.  Gastrointestinal: Positive for constipation. Negative for abdominal distention, abdominal pain, anal bleeding, blood in stool, diarrhea, nausea, rectal pain and vomiting.  Endocrine: Negative for cold intolerance, heat intolerance, polydipsia, polyphagia and polyuria.  Genitourinary: Negative for decreased urine volume, difficulty urinating, dyspareunia, dysuria, enuresis, flank pain, frequency, genital sores, hematuria, menstrual problem, pelvic pain, urgency, vaginal bleeding, vaginal discharge and vaginal pain.  Musculoskeletal: Negative for arthralgias, back pain, gait problem, joint swelling, myalgias, neck pain and neck stiffness.  Skin: Negative for color change, pallor, rash and wound.  Allergic/Immunologic: Negative for environmental allergies, food allergies and immunocompromised state.  Neurological: Negative for dizziness, tremors, seizures, syncope, facial asymmetry, speech difficulty, weakness, light-headedness, numbness and headaches.  Hematological: Negative for adenopathy. Does not bruise/bleed easily.  Psychiatric/Behavioral: Negative for  agitation, behavioral problems, confusion, decreased concentration, dysphoric mood, hallucinations, self-injury, sleep disturbance and suicidal ideas. The patient is not nervous/anxious and is not hyperactive.     Objective:  BP 112/72 (BP Location: Left Arm, Patient Position: Sitting, Cuff Size: Normal)   Pulse 95   Temp 98.6 F (37 C) (Oral)   Resp 18   Ht 5' 3.27" (1.607 m)   Wt 169 lb 3.2 oz (76.7 kg)   LMP 07/01/2017 (Exact Date)   SpO2 98%   BMI 29.72 kg/m   Physical Exam  Constitutional: She is oriented to person, place, and time. She appears well-developed and well-nourished. No distress.  HENT:  Head: Normocephalic and atraumatic.  Right Ear: Hearing, tympanic membrane, external ear and ear canal normal.  Left Ear: Hearing, tympanic membrane, external ear and ear canal normal.  Nose: Nose normal.  Mouth/Throat: Uvula is midline, oropharynx is clear and moist and mucous membranes are normal. No oropharyngeal exudate.  Eyes: Pupils are equal, round, and reactive to light. Conjunctivae, EOM and lids are normal. No scleral icterus.  Neck: Trachea normal and normal range of motion. No thyroid mass and no thyromegaly present.  Cardiovascular: Normal rate, regular rhythm, normal heart sounds and intact distal pulses.  Pulmonary/Chest: Effort normal and  breath sounds normal.  Abdominal: Soft. Normal appearance and bowel sounds are normal. There is no tenderness.  Lymphadenopathy:       Head (right side): No tonsillar, no preauricular, no posterior auricular and no occipital adenopathy present.       Head (left side): No tonsillar, no preauricular, no posterior auricular and no occipital adenopathy present.    She has no cervical adenopathy.       Right: No supraclavicular adenopathy present.       Left: No supraclavicular adenopathy present.  Neurological: She is alert and oriented to person, place, and time. She has normal strength and normal reflexes.  Skin: Skin is warm and  dry.    Visual Acuity Screening   Right eye Left eye Both eyes  Without correction: '20/30 20/40 20/30 '$  With correction:        Wt Readings from Last 3 Encounters:  07/16/17 169 lb 3.2 oz (76.7 kg)  06/12/17 171 lb (77.6 kg)  05/20/17 171 lb (77.6 kg)   Results for orders placed or performed in visit on 07/16/17 (from the past 24 hour(s))  POCT urinalysis dipstick     Status: Abnormal   Collection Time: 07/16/17 10:19 AM  Result Value Ref Range   Color, UA yellow yellow   Clarity, UA cloudy (A) clear   Glucose, UA negative negative mg/dL   Bilirubin, UA negative negative   Ketones, POC UA negative negative mg/dL   Spec Grav, UA 1.020 1.010 - 1.025   Blood, UA moderate (A) negative   pH, UA 6.0 5.0 - 8.0   Protein Ur, POC negative negative mg/dL   Urobilinogen, UA 0.2 0.2 or 1.0 E.U./dL   Nitrite, UA Negative Negative   Leukocytes, UA Negative Negative    Assessment and Plan :  Discussed healthy lifestyle, diet, exercise, preventative care, vaccinations, and addressed patient's concerns. Plan for follow up in one year.  Otherwise, plan for specific conditions below.  1. Annual physical exam Await lab results.  2. Screening, anemia, deficiency, iron - CBC with Differential  3. Screening for thyroid disorder - TSH  4. Screening cholesterol level - Lipid Panel  5. Screening for hematuria or proteinuria - POCT urinalysis dipstick - Urine Microscopic  6. Screening for metabolic disorder - MCR75+OHKG  7. Need for Tdap vaccination - Tdap vaccine greater than or equal to 7yo IM   Tenna Delaine, PA-C  [facility] 07/16/2017 9:54 AM

## 2017-07-16 NOTE — Patient Instructions (Addendum)
Health Maintenance, Female Adopting a healthy lifestyle and getting preventive care can go a long way to promote health and wellness. Talk with your health care provider about what schedule of regular examinations is right for you. This is a good chance for you to check in with your provider about disease prevention and staying healthy. In between checkups, there are plenty of things you can do on your own. Experts have done a lot of research about which lifestyle changes and preventive measures are most likely to keep you healthy. Ask your health care provider for more information. Weight and diet Eat a healthy diet  Be sure to include plenty of vegetables, fruits, low-fat dairy products, and lean protein.  Do not eat a lot of foods high in solid fats, added sugars, or salt.  Get regular exercise. This is one of the most important things you can do for your health. ? Most adults should exercise for at least 150 minutes each week. The exercise should increase your heart rate and make you sweat (moderate-intensity exercise). ? Most adults should also do strengthening exercises at least twice a week. This is in addition to the moderate-intensity exercise.  Maintain a healthy weight  Body mass index (BMI) is a measurement that can be used to identify possible weight problems. It estimates body fat based on height and weight. Your health care provider can help determine your BMI and help you achieve or maintain a healthy weight.  For females 69 years of age and older: ? A BMI below 18.5 is considered underweight. ? A BMI of 18.5 to 24.9 is normal. ? A BMI of 25 to 29.9 is considered overweight. ? A BMI of 30 and above is considered obese.  Watch levels of cholesterol and blood lipids  You should start having your blood tested for lipids and cholesterol at 45 years of age, then have this test every 5 years.  You may need to have your cholesterol levels checked more often if: ? Your lipid or  cholesterol levels are high. ? You are older than 45 years of age. ? You are at high risk for heart disease.  Cancer screening Lung Cancer  Lung cancer screening is recommended for adults 70-27 years old who are at high risk for lung cancer because of a history of smoking.  A yearly low-dose CT scan of the lungs is recommended for people who: ? Currently smoke. ? Have quit within the past 15 years. ? Have at least a 30-pack-year history of smoking. A pack year is smoking an average of one pack of cigarettes a day for 1 year.  Yearly screening should continue until it has been 15 years since you quit.  Yearly screening should stop if you develop a health problem that would prevent you from having lung cancer treatment.  Breast Cancer  Practice breast self-awareness. This means understanding how your breasts normally appear and feel.  It also means doing regular breast self-exams. Let your health care provider know about any changes, no matter how small.  If you are in your 20s or 30s, you should have a clinical breast exam (CBE) by a health care provider every 1-3 years as part of a regular health exam.  If you are 68 or older, have a CBE every year. Also consider having a breast X-ray (mammogram) every year.  If you have a family history of breast cancer, talk to your health care provider about genetic screening.  If you are at high risk  for breast cancer, talk to your health care provider about having an MRI and a mammogram every year.  Breast cancer gene (BRCA) assessment is recommended for women who have family members with BRCA-related cancers. BRCA-related cancers include: ? Breast. ? Ovarian. ? Tubal. ? Peritoneal cancers.  Results of the assessment will determine the need for genetic counseling and BRCA1 and BRCA2 testing.  Cervical Cancer Your health care provider may recommend that you be screened regularly for cancer of the pelvic organs (ovaries, uterus, and  vagina). This screening involves a pelvic examination, including checking for microscopic changes to the surface of your cervix (Pap test). You may be encouraged to have this screening done every 3 years, beginning at age 22.  For women ages 56-65, health care providers may recommend pelvic exams and Pap testing every 3 years, or they may recommend the Pap and pelvic exam, combined with testing for human papilloma virus (HPV), every 5 years. Some types of HPV increase your risk of cervical cancer. Testing for HPV may also be done on women of any age with unclear Pap test results.  Other health care providers may not recommend any screening for nonpregnant women who are considered low risk for pelvic cancer and who do not have symptoms. Ask your health care provider if a screening pelvic exam is right for you.  If you have had past treatment for cervical cancer or a condition that could lead to cancer, you need Pap tests and screening for cancer for at least 20 years after your treatment. If Pap tests have been discontinued, your risk factors (such as having a new sexual partner) need to be reassessed to determine if screening should resume. Some women have medical problems that increase the chance of getting cervical cancer. In these cases, your health care provider may recommend more frequent screening and Pap tests.  Colorectal Cancer  This type of cancer can be detected and often prevented.  Routine colorectal cancer screening usually begins at 45 years of age and continues through 45 years of age.  Your health care provider may recommend screening at an earlier age if you have risk factors for colon cancer.  Your health care provider may also recommend using home test kits to check for hidden blood in the stool.  A small camera at the end of a tube can be used to examine your colon directly (sigmoidoscopy or colonoscopy). This is done to check for the earliest forms of colorectal  cancer.  Routine screening usually begins at age 33.  Direct examination of the colon should be repeated every 5-10 years through 45 years of age. However, you may need to be screened more often if early forms of precancerous polyps or small growths are found.  Skin Cancer  Check your skin from head to toe regularly.  Tell your health care provider about any new moles or changes in moles, especially if there is a change in a mole's shape or color.  Also tell your health care provider if you have a mole that is larger than the size of a pencil eraser.  Always use sunscreen. Apply sunscreen liberally and repeatedly throughout the day.  Protect yourself by wearing long sleeves, pants, a wide-brimmed hat, and sunglasses whenever you are outside.  Heart disease, diabetes, and high blood pressure  High blood pressure causes heart disease and increases the risk of stroke. High blood pressure is more likely to develop in: ? People who have blood pressure in the high end of  the normal range (130-139/85-89 mm Hg). ? People who are overweight or obese. ? People who are African American.  If you are 21-29 years of age, have your blood pressure checked every 3-5 years. If you are 3 years of age or older, have your blood pressure checked every year. You should have your blood pressure measured twice-once when you are at a hospital or clinic, and once when you are not at a hospital or clinic. Record the average of the two measurements. To check your blood pressure when you are not at a hospital or clinic, you can use: ? An automated blood pressure machine at a pharmacy. ? A home blood pressure monitor.  If you are between 17 years and 37 years old, ask your health care provider if you should take aspirin to prevent strokes.  Have regular diabetes screenings. This involves taking a blood sample to check your fasting blood sugar level. ? If you are at a normal weight and have a low risk for diabetes,  have this test once every three years after 45 years of age. ? If you are overweight and have a high risk for diabetes, consider being tested at a younger age or more often. Preventing infection Hepatitis B  If you have a higher risk for hepatitis B, you should be screened for this virus. You are considered at high risk for hepatitis B if: ? You were born in a country where hepatitis B is common. Ask your health care provider which countries are considered high risk. ? Your parents were born in a high-risk country, and you have not been immunized against hepatitis B (hepatitis B vaccine). ? You have HIV or AIDS. ? You use needles to inject street drugs. ? You live with someone who has hepatitis B. ? You have had sex with someone who has hepatitis B. ? You get hemodialysis treatment. ? You take certain medicines for conditions, including cancer, organ transplantation, and autoimmune conditions.  Hepatitis C  Blood testing is recommended for: ? Everyone born from [birth year] through 1963. ? Anyone with known risk factors for hepatitis C.  Sexually transmitted infections (STIs)  You should be screened for sexually transmitted infections (STIs) including gonorrhea and chlamydia if: ? You are sexually active and are younger than 45 years of age. ? You are older than 45 years of age and your health care provider tells you that you are at risk for this type of infection. ? Your sexual activity has changed since you were last screened and you are at an increased risk for chlamydia or gonorrhea. Ask your health care provider if you are at risk.  If you do not have HIV, but are at risk, it may be recommended that you take a prescription medicine daily to prevent HIV infection. This is called pre-exposure prophylaxis (PrEP). You are considered at risk if: ? You are sexually active and do not regularly use condoms or know the HIV status of your partner(s). ? You take drugs by injection. ? You are  sexually active with a partner who has HIV.  Talk with your health care provider about whether you are at high risk of being infected with HIV. If you choose to begin PrEP, you should first be tested for HIV. You should then be tested every 3 months for as long as you are taking PrEP. Pregnancy  If you are premenopausal and you may become pregnant, ask your health care provider about preconception counseling.  If you may become  pregnant, take 400 to 800 micrograms (mcg) of folic acid every day.  If you want to prevent pregnancy, talk to your health care provider about birth control (contraception). Osteoporosis and menopause  Osteoporosis is a disease in which the bones lose minerals and strength with aging. This can result in serious bone fractures. Your risk for osteoporosis can be identified using a bone density scan.  If you are 25 years of age or older, or if you are at risk for osteoporosis and fractures, ask your health care provider if you should be screened.  Ask your health care provider whether you should take a calcium or vitamin D supplement to lower your risk for osteoporosis.  Menopause may have certain physical symptoms and risks.  Hormone replacement therapy may reduce some of these symptoms and risks. Talk to your health care provider about whether hormone replacement therapy is right for you. Follow these instructions at home:  Schedule regular health, dental, and eye exams.  Stay current with your immunizations.  Do not use any tobacco products including cigarettes, chewing tobacco, or electronic cigarettes.  If you are pregnant, do not drink alcohol.  If you are breastfeeding, limit how much and how often you drink alcohol.  Limit alcohol intake to no more than 1 drink per day for nonpregnant women. One drink equals 12 ounces of beer, 5 ounces of wine, or 1 ounces of hard liquor.  Do not use street drugs.  Do not share needles.  Ask your health care  provider for help if you need support or information about quitting drugs.  Tell your health care provider if you often feel depressed.  Tell your health care provider if you have ever been abused or do not feel safe at home. This information is not intended to replace advice given to you by your health care provider. Make sure you discuss any questions you have with your health care provider. Document Released: 09/30/2010 Document Revised: 08/23/2015 Document Reviewed: 12/19/2014 Elsevier Interactive Patient Education  2018 [employer].   Constipation, Adult Constipation is when a person:  Poops (has a bowel movement) fewer times in a week than normal.  Has a hard time pooping.  Has poop that is dry, hard, or bigger than normal.  Follow these instructions at home: Eating and drinking   Eat foods that have a lot of fiber, such as: ? Fresh fruits and vegetables. ? Whole grains. ? Beans.  Eat less of foods that are high in fat, low in fiber, or overly processed, such as: ? [place] fries. ? Hamburgers. ? Cookies. ? Candy. ? Soda.  Drink enough fluid to keep your pee (urine) clear or pale yellow. General instructions  Exercise regularly or as told by your doctor.  Go to the restroom when you feel like you need to poop. Do not hold it in.  Take over-the-counter and prescription medicines only as told by your doctor. These include any fiber supplements.  Do pelvic floor retraining exercises, such as: ? Doing deep breathing while relaxing your lower belly (abdomen). ? Relaxing your pelvic floor while pooping.  Watch your condition for any changes.  Keep all follow-up visits as told by your doctor. This is important. Contact a doctor if:  You have pain that gets worse.  You have a fever.  You have not pooped for 4 days.  You throw up (vomit).  You are not hungry.  You lose weight.  You are bleeding from the anus.  You have thin, pencil-like  poop  (stool). Get help right away if:  You have a fever, and your symptoms suddenly get worse.  You leak poop or have blood in your poop.  Your belly feels hard or bigger than normal (is bloated).  You have very bad belly pain.  You feel dizzy or you faint. This information is not intended to replace advice given to you by your health care provider. Make sure you discuss any questions you have with your health care provider. Document Released: 09/03/2007 Document Revised: 10/05/2015 Document Reviewed: 09/05/2015 Elsevier Interactive Patient Education  2018 [employer].   IF you received an x-ray today, you will receive an invoice from [facility]. Please contact [facility] at [phone] with questions or concerns regarding your invoice.   IF you received labwork today, you will receive an invoice from [facility]. Please contact [facility] at [phone] with questions or concerns regarding your invoice.   Our billing staff will not be able to assist you with questions regarding bills from these companies.  You will be contacted with the lab results as soon as they are available. The fastest way to get your results is to activate your My Chart account. Instructions are located on the last page of this paperwork. If you have not heard from [place] regarding the results in 2 weeks, please contact this office.

## 2017-07-17 LAB — CMP14+EGFR
ALBUMIN: 4 g/dL (ref 3.5–5.5)
ALT: 10 IU/L (ref 0–32)
AST: 9 IU/L (ref 0–40)
Albumin/Globulin Ratio: 1.5 (ref 1.2–2.2)
Alkaline Phosphatase: 88 IU/L (ref 39–117)
BUN / CREAT RATIO: 18 (ref 9–23)
BUN: 14 mg/dL (ref 6–24)
Bilirubin Total: 0.4 mg/dL (ref 0.0–1.2)
CALCIUM: 8.8 mg/dL (ref 8.7–10.2)
CO2: 25 mmol/L (ref 20–29)
CREATININE: 0.8 mg/dL (ref 0.57–1.00)
Chloride: 103 mmol/L (ref 96–106)
GFR, EST AFRICAN AMERICAN: 104 mL/min/{1.73_m2} (ref >59)
GFR, EST NON AFRICAN AMERICAN: 90 mL/min/{1.73_m2} (ref >59)
GLOBULIN, TOTAL: 2.7 g/dL (ref 1.5–4.5)
GLUCOSE: 91 mg/dL (ref 65–99)
Potassium: 4.8 mmol/L (ref 3.5–5.2)
SODIUM: 140 mmol/L (ref 134–144)
TOTAL PROTEIN: 6.7 g/dL (ref 6.0–8.5)

## 2017-07-17 LAB — CBC WITH DIFFERENTIAL/PLATELET
Basophils Absolute: 0 10*3/uL (ref 0.0–0.2)
Basos: 0 %
EOS (ABSOLUTE): 0.1 10*3/uL (ref 0.0–0.4)
Eos: 2 %
HEMOGLOBIN: 13.3 g/dL (ref 11.1–15.9)
Hematocrit: 41.7 % (ref 34.0–46.6)
IMMATURE GRANULOCYTES: 0 %
Immature Grans (Abs): 0 10*3/uL (ref 0.0–0.1)
LYMPHS ABS: 1.5 10*3/uL (ref 0.7–3.1)
LYMPHS: 21 %
MCH: 27.7 pg (ref 26.6–33.0)
MCHC: 31.9 g/dL (ref 31.5–35.7)
MCV: 87 fL (ref 79–97)
MONOS ABS: 0.5 10*3/uL (ref 0.1–0.9)
Monocytes: 7 %
NEUTROS ABS: 5 10*3/uL (ref 1.4–7.0)
NEUTROS PCT: 70 %
Platelets: 271 10*3/uL (ref 150–379)
RBC: 4.8 x10E6/uL (ref 3.77–5.28)
RDW: 13.2 % (ref 12.3–15.4)
WBC: 7.2 10*3/uL (ref 3.4–10.8)

## 2017-07-17 LAB — URINALYSIS, MICROSCOPIC ONLY: CASTS: NONE SEEN /LPF

## 2017-07-17 LAB — TSH: TSH: 1.84 u[IU]/mL (ref 0.450–4.500)

## 2017-07-17 LAB — LIPID PANEL
CHOL/HDL RATIO: 4.8 ratio — AB (ref 0.0–4.4)
CHOLESTEROL TOTAL: 174 mg/dL (ref 100–199)
HDL: 36 mg/dL — AB (ref >39)
LDL Calculated: 115 mg/dL — ABNORMAL HIGH (ref 0–99)
TRIGLYCERIDES: 113 mg/dL (ref 0–149)
VLDL Cholesterol Cal: 23 mg/dL (ref 5–40)

## 2017-10-05 ENCOUNTER — Telehealth: Payer: Self-pay | Admitting: Physician Assistant

## 2017-10-05 NOTE — Telephone Encounter (Signed)
Pt came into office to ask if Ms. Timmothy Euler could give her a call to explain lab results from 07/16/17. Pt. Asserted she didn't receive letter Ms. Timmothy Euler had left instructions for.

## 2017-10-07 NOTE — Telephone Encounter (Signed)
LMOVM - sending another letter with all labs and lengthy note from [place].

## 2017-10-27 ENCOUNTER — Ambulatory Visit: Payer: BLUE CROSS/BLUE SHIELD | Admitting: Physician Assistant

## 2018-02-17 ENCOUNTER — Other Ambulatory Visit: Payer: Self-pay

## 2018-02-17 ENCOUNTER — Encounter (HOSPITAL_COMMUNITY): Payer: Self-pay | Admitting: Emergency Medicine

## 2018-02-17 ENCOUNTER — Emergency Department (HOSPITAL_COMMUNITY)
Admission: EM | Admit: 2018-02-17 | Discharge: 2018-02-18 | Disposition: A | Discharge status: Home / Self Care | Payer: BLUE CROSS/BLUE SHIELD | Attending: Emergency Medicine | Admitting: Emergency Medicine

## 2018-02-17 DIAGNOSIS — I471 Supraventricular tachycardia: Secondary | ICD-10-CM

## 2018-02-17 DIAGNOSIS — F17210 Nicotine dependence, cigarettes, uncomplicated: Secondary | ICD-10-CM | POA: Insufficient documentation

## 2018-02-17 DIAGNOSIS — R000 Tachycardia, unspecified: Secondary | ICD-10-CM | POA: Diagnosis present

## 2018-02-17 LAB — CBC WITH DIFFERENTIAL/PLATELET
ABS IMMATURE GRANULOCYTES: 0.05 10*3/uL (ref 0.00–0.07)
BASOS PCT: 0 %
Basophils Absolute: 0 10*3/uL (ref 0.0–0.1)
EOS PCT: 2 %
Eosinophils Absolute: 0.2 10*3/uL (ref 0.0–0.5)
HCT: 42.5 % (ref 36.0–46.0)
HEMOGLOBIN: 13 g/dL (ref 12.0–15.0)
Immature Granulocytes: 1 %
Lymphocytes Relative: 29 %
Lymphs Abs: 2.4 10*3/uL (ref 0.7–4.0)
MCH: 27.7 pg (ref 26.0–34.0)
MCHC: 30.6 g/dL (ref 30.0–36.0)
MCV: 90.6 fL (ref 80.0–100.0)
MONO ABS: 0.8 10*3/uL (ref 0.1–1.0)
MONOS PCT: 9 %
NEUTROS ABS: 4.9 10*3/uL (ref 1.7–7.7)
Neutrophils Relative %: 59 %
Platelets: 227 10*3/uL (ref 150–400)
RBC: 4.69 MIL/uL (ref 3.87–5.11)
RDW: 13.1 % (ref 11.5–15.5)
WBC: 8.3 10*3/uL (ref 4.0–10.5)
nRBC: 0 % (ref 0.0–0.2)

## 2018-02-17 LAB — TSH: TSH: 1.406 u[IU]/mL (ref 0.350–4.500)

## 2018-02-17 LAB — BASIC METABOLIC PANEL
Anion gap: 7 (ref 5–15)
BUN: 22 mg/dL — AB (ref 6–20)
CO2: 25 mmol/L (ref 22–32)
CREATININE: 0.8 mg/dL (ref 0.44–1.00)
Calcium: 8.8 mg/dL — ABNORMAL LOW (ref 8.9–10.3)
Chloride: 107 mmol/L (ref 98–111)
GFR calc Af Amer: >60 mL/min (ref >60)
GLUCOSE: 97 mg/dL (ref 70–99)
POTASSIUM: 3.9 mmol/L (ref 3.5–5.1)
SODIUM: 139 mmol/L (ref 135–145)

## 2018-02-17 LAB — TROPONIN I

## 2018-02-17 NOTE — ED Triage Notes (Addendum)
Patient reports "I bent over at work and when I stood up I felt like my heart was beating fast." EKG performed at patient's place of work showing HR 197 and SVT. Reports physician had patient perform valsalva maneuver to lower HR and come to ED for evaluation.

## 2018-02-17 NOTE — ED Provider Notes (Signed)
[facility] DEPT Provider Note: Georgena Spurling, MD, FACEP  CSN: [id number] MRN: [id number] ARRIVAL: 02/17/18 at 2123 ROOM: [place]  Tachycardia   HISTORY OF PRESENT ILLNESS  02/17/18 10:49 PM Wendy Howard is a 47 y.o. female without significant past medical history.  She was at work about 7:45 PM and bent over.  She had the sudden onset of a rapid heartbeat.  She felt like her heart was racing and also skipping beats.  She had no associated shortness of breath, chest pain or lightheadedness.  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  Valsalva and other vagal maneuvers were tried and her heart rate went down to 177 and she was sent here.  On arrival here her heart rate was 118 and EKG showed sinus tachycardia.  Her heart rate has subsequently slowed to the 90s.  She has no history of arrhythmia.   Past Medical History:  Diagnosis Date  . Migraine     Past Surgical History:  Procedure Laterality Date  . DILATION AND EVACUATION N/A 05/02/2013   Procedure: SUCTION DILATATION AND EVACUATION;  Surgeon: Shelly Bombard, MD;  Location: [facility];  Service: Gynecology;  Laterality: N/A;  . KNEE SURGERY    . KNEE SURGERY    . KNEE SURGERY      Family History  Problem Relation Age of Onset  . Hypertension Father   . Hypertension Maternal Grandmother   . Hypertension Sister     Social History   Tobacco Use  . Smoking status: Current Every Day Smoker    Packs/day: 0.50    Types: Cigarettes  . Smokeless tobacco: Never Used  Substance Use Topics  . Alcohol use: Yes    Alcohol/week: 0.0 standard drinks    Comment: occaisional   . Drug use: No    Prior to Admission medications   Medication Sig Start Date End Date Taking? Authorizing Provider  aspirin-acetaminophen-caffeine (EXCEDRIN MIGRAINE) [phone] MG tablet Take 1 tablet by mouth daily as needed for headache or  migraine.   Yes [provider]  Prenatal Vit-Iron Carbonyl-FA (PRENATAL PLUS IRON) 29-1 MG TABS Take 1 tablet by mouth daily before breakfast. Patient not taking: Reported on 02/17/2018 05/08/15   Shelly Bombard, MD  tinidazole (TINDAMAX) 500 MG tablet Take 2 tablets (1,000 mg total) by mouth daily with breakfast. Patient not taking: Reported on 02/17/2018 06/12/17   Shelly Bombard, MD    Allergies Other   REVIEW OF SYSTEMS  Negative except as noted here or in the History of Present Illness.   PHYSICAL EXAMINATION  Initial Vital Signs Blood pressure 110/77, pulse 99, temperature 98.1 F (36.7 C), resp. rate 17, SpO2 99 %.  Examination General: Well-developed, well-nourished female in no acute distress; appearance consistent with age of record HENT: normocephalic; atraumatic Eyes: pupils equal, round and reactive to light; extraocular muscles intact Neck: supple Heart: regular rate and rhythm Lungs: clear to auscultation bilaterally Abdomen: soft; nondistended; nontender; no masses or hepatosplenomegaly; bowel sounds present Extremities: No deformity; full range of motion; pulses normal Neurologic: Awake, alert and oriented; motor function intact in all extremities and symmetric; no facial droop Skin: Warm and dry Psychiatric: Normal mood and affect   RESULTS  Summary of this visit's results, reviewed by myself:   EKG Interpretation  Date/Time:  Wednesday February 17 2018 21:39:08 EST Ventricular Rate:  111 PR  Interval:    QRS Duration: 76 QT Interval:  305 QTC Calculation: 415 R Axis:   21 Text Interpretation:  Sinus tachycardia Probable left atrial enlargement RSR' in V1 or V2, probably normal variant Borderline T abnormalities, anterior leads Rate is faster Confirmed by Simara Rhyner, Jenny Reichmann [id number]) on 02/17/2018 10:37:31 PM      Laboratory Studies: Results for orders placed or performed during the hospital encounter of 02/17/18 (from the past 24 hour(s))    Troponin I - ONCE - STAT     Status: None   Collection Time: 02/17/18 10:45 PM  Result Value Ref Range   Troponin I <0.03 <0.03 ng/mL  Basic metabolic panel     Status: Abnormal   Collection Time: 02/17/18 10:45 PM  Result Value Ref Range   Sodium 139 135 - 145 mmol/L   Potassium 3.9 3.5 - 5.1 mmol/L   Chloride 107 98 - 111 mmol/L   CO2 25 22 - 32 mmol/L   Glucose, Bld 97 70 - 99 mg/dL   BUN 22 (H) 6 - 20 mg/dL   Creatinine, Ser 0.80 0.44 - 1.00 mg/dL   Calcium 8.8 (L) 8.9 - 10.3 mg/dL   GFR calc non Af Amer >60 >60 mL/min   GFR calc Af Amer >60 >60 mL/min   Anion gap 7 5 - 15  CBC with Differential/Platelet     Status: None   Collection Time: 02/17/18 10:45 PM  Result Value Ref Range   WBC 8.3 4.0 - 10.5 K/uL   RBC 4.69 3.87 - 5.11 MIL/uL   Hemoglobin 13.0 12.0 - 15.0 g/dL   HCT 42.5 36.0 - 46.0 %   MCV 90.6 80.0 - 100.0 fL   MCH 27.7 26.0 - 34.0 pg   MCHC 30.6 30.0 - 36.0 g/dL   RDW 13.1 11.5 - 15.5 %   Platelets 227 150 - 400 K/uL   nRBC 0.0 0.0 - 0.2 %   Neutrophils Relative % 59 %   Neutro Abs 4.9 1.7 - 7.7 K/uL   Lymphocytes Relative 29 %   Lymphs Abs 2.4 0.7 - 4.0 K/uL   Monocytes Relative 9 %   Monocytes Absolute 0.8 0.1 - 1.0 K/uL   Eosinophils Relative 2 %   Eosinophils Absolute 0.2 0.0 - 0.5 K/uL   Basophils Relative 0 %   Basophils Absolute 0.0 0.0 - 0.1 K/uL   Immature Granulocytes 1 %   Abs Immature Granulocytes 0.05 0.00 - 0.07 K/uL  TSH     Status: None   Collection Time: 02/17/18 10:45 PM  Result Value Ref Range   TSH 1.406 0.350 - 4.500 uIU/mL   Imaging Studies: No results found.  ED COURSE and MDM  Nursing notes and initial vitals signs, including pulse oximetry, reviewed.  Vitals:   02/17/18 2132 02/17/18 2230 02/17/18 2300 02/17/18 2330  BP: 140/81 110/77 123/86 113/86  Pulse: (!) 118 99 97 97  Resp:  17 15 12   Temp: 98.1 F (36.7 C)     SpO2: 98% 99% 99% 98%   12:37 AM Patient still in normal sinus rhythm.  Labs reassuring.  Will  refer to cardiology for further evaluation.  PROCEDURES    ED DIAGNOSES     ICD-10-CM   1. Paroxysmal supraventricular tachycardia ([address]) I47.1        Briane Birden, MD 02/18/18 [phone]

## 2018-03-22 DIAGNOSIS — M19039 Primary osteoarthritis, unspecified wrist: Secondary | ICD-10-CM | POA: Insufficient documentation

## 2018-03-22 DIAGNOSIS — M13831 Other specified arthritis, right wrist: Secondary | ICD-10-CM | POA: Diagnosis not present

## 2018-03-22 DIAGNOSIS — M67441 Ganglion, right hand: Secondary | ICD-10-CM | POA: Insufficient documentation

## 2018-05-12 ENCOUNTER — Other Ambulatory Visit: Payer: Self-pay | Admitting: Obstetrics

## 2018-05-12 DIAGNOSIS — Z1231 Encounter for screening mammogram for malignant neoplasm of breast: Secondary | ICD-10-CM

## 2018-06-21 ENCOUNTER — Ambulatory Visit: Payer: BLUE CROSS/BLUE SHIELD | Admitting: Obstetrics

## 2018-07-19 ENCOUNTER — Ambulatory Visit: Payer: BLUE CROSS/BLUE SHIELD

## 2018-08-02 ENCOUNTER — Telehealth (INDEPENDENT_AMBULATORY_CARE_PROVIDER_SITE_OTHER): Payer: BLUE CROSS/BLUE SHIELD | Admitting: Family Medicine

## 2018-08-02 ENCOUNTER — Ambulatory Visit: Payer: BLUE CROSS/BLUE SHIELD | Admitting: Obstetrics

## 2018-08-02 ENCOUNTER — Other Ambulatory Visit: Payer: Self-pay

## 2018-08-02 DIAGNOSIS — Z1329 Encounter for screening for other suspected endocrine disorder: Secondary | ICD-10-CM

## 2018-08-02 DIAGNOSIS — Z13220 Encounter for screening for lipoid disorders: Secondary | ICD-10-CM

## 2018-08-02 DIAGNOSIS — R0683 Snoring: Secondary | ICD-10-CM

## 2018-08-02 DIAGNOSIS — J302 Other seasonal allergic rhinitis: Secondary | ICD-10-CM

## 2018-08-02 DIAGNOSIS — Z0000 Encounter for general adult medical examination without abnormal findings: Secondary | ICD-10-CM

## 2018-08-02 DIAGNOSIS — Z13228 Encounter for screening for other metabolic disorders: Secondary | ICD-10-CM

## 2018-08-02 MED ORDER — CETIRIZINE HCL 10 MG PO TABS: 10.0000 mg | ORAL_TABLET | Freq: Every day | ORAL | 3 refills | Status: DC

## 2018-08-02 NOTE — Patient Instructions (Signed)
Preventive Care 40-64 Years, Female Preventive care refers to lifestyle choices and visits with your health care provider that can promote health and wellness. What does preventive care include?   A yearly physical exam. This is also called an annual well check.  Dental exams once or twice a year.  Routine eye exams. Ask your health care provider how often you should have your eyes checked.  Personal lifestyle choices, including: ? Daily care of your teeth and gums. ? Regular physical activity. ? Eating a healthy diet. ? Avoiding tobacco and drug use. ? Limiting alcohol use. ? Practicing safe sex. ? Taking low-dose aspirin daily starting at age 50. ? Taking vitamin and mineral supplements as recommended by your health care provider. What happens during an annual well check? The services and screenings done by your health care provider during your annual well check will depend on your age, overall health, lifestyle risk factors, and family history of disease. Counseling Your health care provider may ask you questions about your:  Alcohol use.  Tobacco use.  Drug use.  Emotional well-being.  Home and relationship well-being.  Sexual activity.  Eating habits.  Work and work environment.  Method of birth control.  Menstrual cycle.  Pregnancy history. Screening You may have the following tests or measurements:  Height, weight, and BMI.  Blood pressure.  Lipid and cholesterol levels. These may be checked every 5 years, or more frequently if you are over 50 years old.  Skin check.  Lung cancer screening. You may have this screening every year starting at age 55 if you have a 30-pack-year history of smoking and currently smoke or have quit within the past 15 years.  Colorectal cancer screening. All adults should have this screening starting at age 50 and continuing until age 75. Your health care provider may recommend screening at age 45. You will have tests every  1-10 years, depending on your results and the type of screening test. People at increased risk should start screening at an earlier age. Screening tests may include: ? Guaiac-based fecal occult blood testing. ? Fecal immunochemical test (FIT). ? Stool DNA test. ? Virtual colonoscopy. ? Sigmoidoscopy. During this test, a flexible tube with a tiny camera (sigmoidoscope) is used to examine your rectum and lower colon. The sigmoidoscope is inserted through your anus into your rectum and lower colon. ? Colonoscopy. During this test, a long, thin, flexible tube with a tiny camera (colonoscope) is used to examine your entire colon and rectum.  Hepatitis C blood test.  Hepatitis B blood test.  Sexually transmitted disease (STD) testing.  Diabetes screening. This is done by checking your blood sugar (glucose) after you have not eaten for a while (fasting). You may have this done every 1-3 years.  Mammogram. This may be done every 1-2 years. Talk to your health care provider about when you should start having regular mammograms. This may depend on whether you have a family history of breast cancer.  BRCA-related cancer screening. This may be done if you have a family history of breast, ovarian, tubal, or peritoneal cancers.  Pelvic exam and Pap test. This may be done every 3 years starting at age 21. Starting at age 30, this may be done every 5 years if you have a Pap test in combination with an HPV test.  Bone density scan. This is done to screen for osteoporosis. You may have this scan if you are at high risk for osteoporosis. Discuss your test results, treatment options,   and if necessary, the need for more tests with your health care provider. Vaccines Your health care provider may recommend certain vaccines, such as:  Influenza vaccine. This is recommended every year.  Tetanus, diphtheria, and acellular pertussis (Tdap, Td) vaccine. You may need a Td booster every 10 years.  Varicella  vaccine. You may need this if you have not been vaccinated.  Zoster vaccine. You may need this after age 94.  Measles, mumps, and rubella (MMR) vaccine. You may need at least one dose of MMR if you were born in 1957 or later. You may also need a second dose.  Pneumococcal 13-valent conjugate (PCV13) vaccine. You may need this if you have certain conditions and were not previously vaccinated.  Pneumococcal polysaccharide (PPSV23) vaccine. You may need one or two doses if you smoke cigarettes or if you have certain conditions.  Meningococcal vaccine. You may need this if you have certain conditions.  Hepatitis A vaccine. You may need this if you have certain conditions or if you travel or work in places where you may be exposed to hepatitis A.  Hepatitis B vaccine. You may need this if you have certain conditions or if you travel or work in places where you may be exposed to hepatitis B.  Haemophilus influenzae type b (Hib) vaccine. You may need this if you have certain conditions. Talk to your health care provider about which screenings and vaccines you need and how often you need them. This information is not intended to replace advice given to you by your health care provider. Make sure you discuss any questions you have with your health care provider. Document Released: 04/13/2015 Document Revised: 05/07/2017 Document Reviewed: 01/16/2015 Elsevier Interactive Patient Education  2019 [employer].

## 2018-08-02 NOTE — Progress Notes (Signed)
Virtual Visit Note  I connected with patient on 08/02/18 at 1031am  by phone and verified that I am speaking with the correct person using two identifiers. Wendy Howard is currently located at home and patient is currently with them during visit. The provider, Rutherford Guys, MD is located in their office at time of visit.  I discussed the limitations, risks, security and privacy concerns of performing an evaluation and management service by telephone and the availability of in person appointments. I also discussed with the patient that there may be a patient responsible charge related to this service. The patient expressed understanding and agreed to proceed.   CC: CPE  HPI ? Last CPE April 2019 G&Ps: 1010, IUFD Pap: march 2019, neg pap and HPV, Dr Jodi Mourning STD: march 2019 BC : none Menses: regular, normal flow, not painful, a/w menstrual migraines, takes Excedrin, works well Mammogram: April 2019, Dr Jodi Mourning, yearly, scheduled for June FHX breast/ovarian cancer: denies FHx colon cancer: MGF died of colon cancer Exercise/diet: planning on starting to walk again, diet average Has not had any weight gain Tobacco/etoh: occasionally, only when she drinks, which once or twice a month illicit drug use: denies Declines flu vaccine  Most Recent Immunizations  Administered Date(s) Administered  . Tdap 07/16/2017    Allergies  Allergen Reactions  . Other Hives    Seafood-hives    Prior to Admission medications   Medication Sig Start Date End Date Taking? Authorizing Provider  aspirin-acetaminophen-caffeine (EXCEDRIN MIGRAINE) [phone] MG tablet Take 1 tablet by mouth daily as needed for headache or migraine.    [provider]    Past Medical History:  Diagnosis Date  . Migraine     Past Surgical History:  Procedure Laterality Date  . DILATION AND EVACUATION N/A 05/02/2013   Procedure: SUCTION DILATATION AND EVACUATION;  Surgeon: Shelly Bombard,  MD;  Location: [facility];  Service: Gynecology;  Laterality: N/A;  . KNEE SURGERY    . KNEE SURGERY    . KNEE SURGERY      Social History   Tobacco Use  . Smoking status: Current Every Day Smoker    Packs/day: 0.50    Types: Cigarettes  . Smokeless tobacco: Never Used  Substance Use Topics  . Alcohol use: Yes    Alcohol/week: 0.0 standard drinks    Comment: occaisional     Family History  Problem Relation Age of Onset  . Hypertension Father   . Hypertension Maternal Grandmother   . Hypertension Sister     Review of Systems  Constitutional: Negative for chills and fever.  HENT: Negative for hearing loss.   Eyes: Positive for blurred vision (wears eyeglasses, thinks she needs a new rx). Negative for double vision and pain.  Respiratory: Negative for cough and shortness of breath.   Cardiovascular: Negative for chest pain, palpitations (seen in ER in nov 2019, paroxysmal SVT, none since then) and leg swelling.  Gastrointestinal: Negative for abdominal pain, blood in stool, constipation, diarrhea, melena, nausea and vomiting.  Genitourinary: Negative for dysuria and hematuria.  Musculoskeletal: Negative for joint pain and myalgias.  Skin: Negative for rash.  Neurological: Positive for headaches (menstrual migraines, sometimes wakes up with headaches). Negative for dizziness, tingling and focal weakness.  Endo/Heme/Allergies: Positive for environmental allergies (taking zyrtec).  Psychiatric/Behavioral: Negative for depression. The patient is not nervous/anxious and does not have insomnia (husband reports apena and snoring, occ daytime somnolence).     Objective  Vitals as reported by the  patient: none   ASSESSMENT and PLAN  1. Annual physical exam  Routine HCM labs ordered. HCM reviewed/discussed. Anticipatory guidance regarding healthy weight, lifestyle and choices given.   2. Screening for metabolic disorder - KFW59+TGAI; Future  3. Screening for thyroid disorder -  TSH; Future  4. Screening cholesterol level - Lipid panel; Future  5. Snoring Snoring with witnessed breathing disturbance, referring to sleep for further eval and treatment of possible OSA - CBC; Future - Ambulatory referral to Sleep Studies  6. Seasonal allergies Cont with zyrtec  Other orders - cetirizine (ZYRTEC) 10 MG tablet; Take 1 tablet (10 mg total) by mouth daily.  FOLLOW-UP:  1 year   The above assessment and management plan was discussed with the patient. The patient verbalized understanding of and has agreed to the management plan. Patient is aware to call the clinic if symptoms persist or worsen. Patient is aware when to return to the clinic for a follow-up visit. Patient educated on when it is appropriate to go to the emergency department.    I provided 17 minutes of non-face-to-face time during this encounter.  Rutherford Guys, MD Primary Care at [facility] [place], [place] [address] Ph.  [phone] Fax [phone]

## 2018-08-30 ENCOUNTER — Telehealth: Payer: Self-pay | Admitting: Neurology

## 2018-08-30 ENCOUNTER — Ambulatory Visit
Admission: RE | Admit: 2018-08-30 | Discharge: 2018-08-30 | Disposition: A | Discharge status: Home / Self Care | Payer: BLUE CROSS/BLUE SHIELD | Source: Ambulatory Visit | Attending: Obstetrics | Admitting: Obstetrics

## 2018-08-30 ENCOUNTER — Other Ambulatory Visit: Payer: Self-pay

## 2018-08-30 DIAGNOSIS — Z1231 Encounter for screening mammogram for malignant neoplasm of breast: Secondary | ICD-10-CM

## 2018-08-30 NOTE — Telephone Encounter (Signed)

## 2018-09-07 ENCOUNTER — Encounter: Payer: Self-pay | Admitting: Neurology

## 2018-09-07 NOTE — Telephone Encounter (Signed)
Attempted to call to review chart. No answer. Unable to LVM for pt to call back. Will send a mychart message also.

## 2018-09-08 ENCOUNTER — Ambulatory Visit (INDEPENDENT_AMBULATORY_CARE_PROVIDER_SITE_OTHER): Payer: BC Managed Care – PPO | Admitting: Neurology

## 2018-09-08 ENCOUNTER — Other Ambulatory Visit: Payer: Self-pay

## 2018-09-08 ENCOUNTER — Encounter: Payer: Self-pay | Admitting: Neurology

## 2018-09-08 DIAGNOSIS — I471 Supraventricular tachycardia: Secondary | ICD-10-CM | POA: Insufficient documentation

## 2018-09-08 DIAGNOSIS — Z87891 Personal history of nicotine dependence: Secondary | ICD-10-CM | POA: Diagnosis not present

## 2018-09-08 DIAGNOSIS — R5383 Other fatigue: Secondary | ICD-10-CM | POA: Diagnosis not present

## 2018-09-08 DIAGNOSIS — R0683 Snoring: Secondary | ICD-10-CM | POA: Diagnosis not present

## 2018-09-08 DIAGNOSIS — F17200 Nicotine dependence, unspecified, uncomplicated: Secondary | ICD-10-CM | POA: Insufficient documentation

## 2018-09-08 DIAGNOSIS — J392 Other diseases of pharynx: Secondary | ICD-10-CM | POA: Diagnosis not present

## 2018-09-08 NOTE — Progress Notes (Signed)
SLEEP MEDICINE CLINIC   Provider:  Larey Seat, M D  Primary Care Physician:  Rutherford Guys, MD   Referring Provider:  Dr. Pamella Pert   Virtual Visit via Video Note  I connected with@ on 09/08/18 at  2:00 PM EDT by a video enabled telemedicine application and verified that I am speaking with the correct person using two identifiers.  Location: Patient: at work  Provider: at [facility]  I discussed the limitations of evaluation and management by telemedicine and the availability of in person appointments. The patient expressed understanding and agreed to proceed.  Larey Seat, MD   HPI:  Wendy Howard is a 48 y.o. female of AA descent seen on 09-08-2018 by video  in a referral  from Dr. Pamella Pert for a sleep apnea evaluation.   Chief complaint according to patient : my husband and son have stated I snore loudly".    Sleep and medical history: [name] was referred by her primary care physician at primary care at [facility], Dr. Grant Fontana.  She was seen for her regular annual physical and a screening for metabolic disorders was performed, including thyroid and cholesterol levels, Zyrtec was prescribed or recommended to be taken for allergic rhinitis.  The patient also brought up that she is snoring loudly with witnessed breathing disturbance according to her husband's observation.  The visit was a non-face-to-face visit. She has been seen in the ED with tachycardia and reports migraine headaches and morning headaches. Migraine-Symptoms began about 5 years ago. Home treatment has included excedrin, which will help.Status post Knee surgery 2015.      Family sleep and medical history: no family member with OSA known to her , she has 2 sisters- healthy , father still alive at age 55 and HTN . Mother died in an accident. Maternal GF died of colon cancer, had HTN.  Social history: smoker 10 cig a day. ETOH socially, 2/monthly.  Caffeine : 2 in AM, iced tea in evening , pepsi 4  /month.  Household consists of her and husband. No children. No pets. 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  walking of exercise. No Hobbies named. Likes cheerleading.   Sleep habits are as follows: see notes During the coronavirus shutdown her work hours have been significantly reduced and just today she had some extra hours that she could log in.  It seems that prior to the coronavirus her bedtime would be more regular as it is currently.  She also states that she skips dinner she does not eat but if she eats she eats before 8 PM rather earlier may be as early as 6 PM.  Her bedtime now varies but used to be between 930 and 1030 but her sleep onset may be between 11 and midnight.  Her average sleep latency now with the corona crisis as well as before is over an hour.  She also reports that some nights she will wake up not knowing what will occur and she may be up for an hour.  She may go to the bathroom also she was not woken by the urge to urinate and she may go to the refrigerator also she does not feel hungry.  She may then sleep for another 4 hours until she rises usually around 630 by alarm.  She states the alarm as needed as she would not wake up spontaneously she averages a total sleep time of 8 to 9 hours currently.  She sleeps on her right side on  one pillow and family stated that she never sleeps supine.  Most nights she seems to dream but these are not nightmares and the dreams are not disturbing to her.  When she wakes up she feels initially not refreshed or restored and is rather sluggish and fatigued.  Once she has taken her shower and had her breakfast coffee she is usually ready to face the day.  Some mornings she will wake up with headaches but again headaches do not wake her up.  She has nearly always a dry mouth in the morning.   ED visit 02/17/18 10:49 PM [name] is a 48 y.o. female without significant past medical history.  She was at work about 7:45 PM and  bent over.  She had the sudden onset of a rapid heartbeat.  She felt like her heart was racing and also skipping beats.  She had no associated shortness of breath, chest pain or lightheadedness.  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  Valsalva and other vagal maneuvers were tried and her heart rate went down to 177 and she was sent here.  On arrival here her heart rate was 118 and EKG showed sinus tachycardia.  Her heart rate has subsequently slowed to the 90s.  She has no history of arrhythmia.   Review of Systems: Out of a complete 14 system review, the patient complains of only the following symptoms, and all other reviewed systems are negative. Loud snoring, dry mouth , morning headaches, fatigue - she feels "more fatigued than sleepy*  How likely are you to doze in the following situations: 0 = not likely, 1 = slight chance, 2 = moderate chance, 3 = high chance  Sitting and Reading? Watching Television? Sitting inactive in a public place (theater or meeting)? Lying down in the afternoon when circumstances permit? Sitting and talking to someone? Sitting quietly after lunch without alcohol? In a car, while stopped for a few minutes in traffic? As a passenger in a car for an hour without a break?  Total =4 , snoring.   Social History   Socioeconomic History  . Marital status: Married    Spouse name: Clifton James  . Number of children: 0  . Years of education: Not on file  . Highest education level: Not on file  Occupational History  . Occupation: Heritage manager   Social Needs  . Financial resource strain: Not hard at all  . Food insecurity:    Worry: Never true    Inability: Never true  . Transportation needs:    Medical: No    Non-medical: No  Tobacco Use  . Smoking status: Current Every Day Smoker    Packs/day: 0.50    Types: Cigarettes  . Smokeless tobacco: Never Used   Substance and Sexual Activity  . Alcohol use: Yes    Alcohol/week: 0.0 standard drinks    Comment: occaisional   . Drug use: No  . Sexual activity: Yes    Partners: Male    Birth control/protection: None    Comment: monagamous partner   Lifestyle  . Physical activity:    Days per week: 0 days    Minutes per session: 0 min  . Stress: Only a little  Relationships  . Social connections:    Talks on phone: Twice a week    Gets together: Never    Attends  religious service: Never    Active member of club or organization: No    Attends meetings of clubs or organizations: Never    Relationship status: Married  . Intimate partner violence:    Fear of current or ex partner: No    Emotionally abused: No    Physically abused: No    Forced sexual activity: No  Other Topics Concern  . Not on file  Social History Narrative   She is married. Lives at home with husband.     Family History  Problem Relation Age of Onset  . Hypertension Father   . Hypertension Maternal Grandmother   . Hypertension Sister     Past Medical History:  Diagnosis Date  . Migraine     Past Surgical History:  Procedure Laterality Date  . DILATION AND EVACUATION N/A 05/02/2013   Procedure: SUCTION DILATATION AND EVACUATION;  Surgeon: Shelly Bombard, MD;  Location: [facility];  Service: Gynecology;  Laterality: N/A;  . KNEE SURGERY    . KNEE SURGERY    . KNEE SURGERY      Current Outpatient Medications  Medication Sig Dispense Refill  . aspirin-acetaminophen-caffeine (EXCEDRIN MIGRAINE) 250-250-65 MG tablet Take 1 tablet by mouth daily as needed for headache or migraine.    . cetirizine (ZYRTEC) 10 MG tablet Take 1 tablet (10 mg total) by mouth daily. 90 tablet 3   No current facility-administered medications for this visit.     Allergies as of 09/08/2018 - Review Complete 09/07/2018  Allergen Reaction Noted  . Other Hives 03/30/2012    Last Weight: not stated Last Height: not stated     UYQ:IHKVQ is no height or weight on file to calculate BMI.       Observation:  General: The patient is awake, alert and appears not in acute distress. The patient is well groomed. Head: Normocephalic, atraumatic. Neck is supple without ROM restriction.  Mallampati grade : video broke off Neck circumference:15 inches Nasal airflow is  patent. Retrognathia is seen.  Respiratory: Breath holding was possible for only 16 (!)seconds. Skin:  Without evidence of facial edema or rash  Neurologic exam : The patient is awake and alert, oriented to place and time.   Attention span & concentration ability appears normal.  Speech is fluent, without  dysarthria, dysphonia or aphasia.  Mood and affect are appropriate.  Cranial nerves: Pupils are equal in size and round.  Extraocular movements  in vertical and horizontal planes intact. Facial motor strength is symmetric and tongue and uvula move midline. Shoulder shrug was symmetrical.   Motor exam:   Normal muscle bulk and symmetric ROM in extremities. Patient reported  Coordination: Reports no evidence of ataxia, dysmetria or tremor.  Gait and station: Patient walks without assistive device -    Assessment and Plan:    High degree of fatigue rather than sleepiness and being not morbidly overweight at BMI 30.   Follow Up Instructions:  HST and PSG split ordered, patient is at high risk of OSA.    I discussed the assessment and treatment plan with the patient. The patient was provided an opportunity to ask questions and all were answered. The patient agreed with the plan and demonstrated an understanding of the instructions.   The patient was advised to call back or seek an in-person evaluation if the symptoms worsen or if the condition fails to improve as anticipated.  I provided 34 minutes of non-face-to-face time during this encounter. The de;lay was due to the  referral visit being already a virtual visit and poor WiFI connection. I had  to complete this tele-health visit by phone.  Larey Seat, MD 3/40/3524, 8:18 PM  Certified in Neurology by ABP[employer] Certified in [place] by [facility] [address] [place], [place] [address]

## 2018-09-13 ENCOUNTER — Telehealth: Payer: Self-pay | Admitting: Neurology

## 2018-09-13 NOTE — Telephone Encounter (Signed)
Patient calling back to schedule her sleep study .

## 2018-09-17 DIAGNOSIS — Z1159 Encounter for screening for other viral diseases: Secondary | ICD-10-CM | POA: Diagnosis not present

## 2018-10-10 ENCOUNTER — Ambulatory Visit: Payer: BC Managed Care – PPO | Admitting: Neurology

## 2018-10-10 DIAGNOSIS — R0683 Snoring: Secondary | ICD-10-CM

## 2018-10-10 DIAGNOSIS — J392 Other diseases of pharynx: Secondary | ICD-10-CM

## 2018-10-10 DIAGNOSIS — G4733 Obstructive sleep apnea (adult) (pediatric): Secondary | ICD-10-CM

## 2018-10-10 DIAGNOSIS — G4734 Idiopathic sleep related nonobstructive alveolar hypoventilation: Secondary | ICD-10-CM

## 2018-10-10 DIAGNOSIS — R5383 Other fatigue: Secondary | ICD-10-CM

## 2018-10-10 DIAGNOSIS — Z87891 Personal history of nicotine dependence: Secondary | ICD-10-CM

## 2018-10-15 NOTE — Addendum Note (Signed)
Addended by: Larey Seat on: 10/15/2018 04:25 PM   Modules accepted: Orders

## 2018-10-15 NOTE — Procedures (Signed)
PATIENT'S NAME:  Wendy Howard, Wendy Howard DOB:      09/14/1970      MR#:    [id number]     DATE OF RECORDING: 10/10/2018 REFERRING M.D.:  Grant Fontana MD Study Performed:   Baseline Polysomnogram HISTORY:  Naarah Borgerding is a 48 y.o. female of AA descent seen on 09-08-2018 by video telehealth upon referral from Dr. Grant Fontana for a sleep apnea evaluation.    Sleep and medical history: [name] was referred by her primary care physician at primary care at [facility], Dr. Grant Fontana.  She was seen for her regular annual physical and a screening for metabolic disorders was performed, including thyroid and cholesterol levels, Zyrtec was prescribed or recommended to be taken for allergic rhinitis. The patient also brought up that she is snoring loudly with witnessed breathing disturbance according to her husband's observation.  The visit was a non-face-to-face visit. She has been seen in the ED with tachycardia and reports migraine headaches and morning headaches, which began about 5 years ago. Home treatment has included Excedrin, which will help. She wakes with a dry mouth. 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    The patient endorsed the Epworth Sleepiness Scale at 4 points.   The patient's weight was given as 170 pounds with a height of 63 (inches), resulting in a BMI of 30.1 kg/m2. The patient's neck circumference measured 15 inches.  CURRENT MEDICATIONS: Excedrin, Zyrtec   PROCEDURE:  This is a multichannel digital polysomnogram utilizing the Somnostar 11.2 system.  Electrodes and sensors were applied and monitored per AASM Specifications.   EEG, EOG, Chin and Limb EMG, were sampled at 200 Hz.  ECG, Snore and Nasal Pressure, Thermal Airflow, Respiratory Effort, CPAP Flow and Pressure, Oximetry was sampled at 50 Hz. Digital video and audio were recorded.      BASELINE STUDY: Lights Out was at 22:59 and Lights On at 05:00.  Total recording time (TRT) was 361  minutes, with a total sleep time (TST) of 194.5 minutes.  The patient's sleep latency was 155.5 minutes.  REM latency was 228.5 minutes.  The sleep efficiency was only 53.9 %.     SLEEP ARCHITECTURE: WASO (Wake after sleep onset) was 136 minutes.  There were 69.5 minutes in Stage N1, 84.5 minutes Stage N2, 27 minutes Stage N3 and 13.5 minutes in Stage REM.  The percentage of Stage N1 was 35.7%, Stage N2 was 43.4%, Stage N3 was 13.9% and Stage R (REM sleep) was 6.9%.   RESPIRATORY ANALYSIS:  There were a total of 150 respiratory events:  10 obstructive apneas, 3 central apneas and 11 mixed apneas with 126 hypopneas. The patient also had many respiratory event related additional arousals (RERAs).      The total APNEA/HYPOPNEA INDEX (AHI) was 46.3 /hour.  16 events occurred in REM sleep and 248 events in NREM. The REM AHI was 71.1 /hour, versus a non-REM AHI of 44.4.  The patient spent 20.5 minutes of total sleep time in the supine position and 174 minutes in non-supine. The supine AHI was 55.6/h versus a non-supine AHI of 45.1/h.  OXYGEN SATURATION & C02:  The Wake baseline 02 saturation was 96%, with the lowest being 75%. Time spent below 89% saturation equaled 32 minutes.  The patient had a total of 0 Periodic Limb Movements. The arousals were noted as: 73 were spontaneous, 0 were associated with PLMs, and 52 were associated with respiratory events. Audio and video analysis  did not show any abnormal or unusual movements, behaviors, phonations or vocalizations. There were loud, grunting snorts recorded while the patient tried to stay asleep- she was on her left side, not supine when she snored the loudest.    The patient took bathroom breaks. Loud Snoring was noted. EKG was in keeping with normal sinus rhythm (NSR).   IMPRESSION: Severely fragmented sleep, Insomnia is more due to Apnea than to Shift Work Sleep disorder.    1. Severe Obstructive Sleep Apnea (OSA) with an AHI of 46.3/h and in REM  sleep exacerbated to 71.1/h and supine 55.6/h. 2. Severe Primary Snoring. 3. Intermittent hypoxemia.   RECOMMENDATIONS:  1. Advise urgently full-night, attended, CPAP titration study to optimize therapy.  If this is not possible may have to do auto CPAP.    I certify that I have reviewed the entire raw data recording prior to the issuance of this report in accordance with the Standards of Accreditation of the American Academy of Sleep Medicine (AASM)    Larey Seat, MD   10-15-2018  Diplomat, American Board of Psychiatry and Neurology  Diplomat, American Board of [employer]ollings Director, [facility] [employer]

## 2018-10-19 ENCOUNTER — Encounter: Payer: Self-pay | Admitting: Neurology

## 2018-10-19 ENCOUNTER — Other Ambulatory Visit: Payer: Self-pay

## 2018-10-19 ENCOUNTER — Telehealth: Payer: Self-pay | Admitting: Neurology

## 2018-10-19 ENCOUNTER — Encounter: Payer: Self-pay | Admitting: Obstetrics

## 2018-10-19 ENCOUNTER — Ambulatory Visit (INDEPENDENT_AMBULATORY_CARE_PROVIDER_SITE_OTHER): Payer: BC Managed Care – PPO | Admitting: Obstetrics

## 2018-10-19 VITALS — BP 118/79 | HR 93 | Ht 62.0 in | Wt 178.0 lb

## 2018-10-19 DIAGNOSIS — N760 Acute vaginitis: Secondary | ICD-10-CM | POA: Diagnosis not present

## 2018-10-19 DIAGNOSIS — Z01419 Encounter for gynecological examination (general) (routine) without abnormal findings: Secondary | ICD-10-CM

## 2018-10-19 DIAGNOSIS — Z124 Encounter for screening for malignant neoplasm of cervix: Secondary | ICD-10-CM

## 2018-10-19 DIAGNOSIS — E669 Obesity, unspecified: Secondary | ICD-10-CM

## 2018-10-19 DIAGNOSIS — N898 Other specified noninflammatory disorders of vagina: Secondary | ICD-10-CM

## 2018-10-19 DIAGNOSIS — Z3169 Encounter for other general counseling and advice on procreation: Secondary | ICD-10-CM

## 2018-10-19 DIAGNOSIS — N839 Noninflammatory disorder of ovary, fallopian tube and broad ligament, unspecified: Secondary | ICD-10-CM

## 2018-10-19 DIAGNOSIS — Z1151 Encounter for screening for human papillomavirus (HPV): Secondary | ICD-10-CM

## 2018-10-19 DIAGNOSIS — B9689 Other specified bacterial agents as the cause of diseases classified elsewhere: Secondary | ICD-10-CM

## 2018-10-19 DIAGNOSIS — Z3009 Encounter for other general counseling and advice on contraception: Secondary | ICD-10-CM

## 2018-10-19 MED ORDER — CITRANATAL RX 27-1 MG PO TABS: 1.0000 | ORAL_TABLET | Freq: Every day | ORAL | 11 refills | Status: DC

## 2018-10-19 NOTE — Telephone Encounter (Signed)
Pt returned call. I advised pt that Dr. Brett Fairy reviewed their sleep study results and found that severe sleep apnea and recommends that pt be treated with a cpap. Dr. Brett Fairy recommends that pt return for a repeat sleep study in order to properly titrate the cpap and ensure a good mask fit. Pt is agreeable to returning for a titration study. I advised pt that our sleep lab will file with pt's insurance and call pt to schedule the sleep study when we hear back from the pt's insurance regarding coverage of this sleep study. Pt verbalized understanding of results. Pt had no questions at this time but was encouraged to call back if questions arise. Patient was hesitant about getting a CPAP, stating she cant sleep with anything on her face. Advised that with the titration study it allows her the opportunity to attempt trying the CPAP machine and fitting a mask they may be comfortable for her to use and try. Pt verbalized understanding and will wait from call from sleep lab.

## 2018-10-19 NOTE — Telephone Encounter (Signed)
Called patient to discuss sleep study results. No answer at this time. LVM for the patient to call back.  Will send a mychart message as well. 

## 2018-10-19 NOTE — Telephone Encounter (Signed)
-----   Message from Larey Seat, MD sent at 10/15/2018  4:25 PM EDT ----- IMPRESSION: Severely fragmented sleep, Insomnia is more due to  Apnea than to Shift Work Sleep disorder.    1. Severe Obstructive Sleep Apnea (OSA) with an AHI of 46.3/h and  in REM sleep exacerbated to 71.1/h and supine 55.6/h.  2. Severe Primary Snoring.  3. Intermittent hypoxemia.   RECOMMENDATIONS:   1. Advise urgently full-night, attended, CPAP titration study to  optimize therapy. If this is not possible may have to do auto  CPAP.

## 2018-10-19 NOTE — Progress Notes (Signed)
Subjective:        Wendy Howard is a 48 y.o. female here for a routine exam.  Current complaints: Trying to get pregnant for the last 5 years without success.   Personal health questionnaire:  Is patient [name], have a family history of breast and/or ovarian cancer: no Is there a family history of uterine cancer diagnosed at age < 37, gastrointestinal cancer, urinary tract cancer, family member who is a Field seismologist syndrome-associated carrier: no Is the patient overweight and hypertensive, family history of diabetes, personal history of gestational diabetes, preeclampsia or PCOS: no Is patient over 40, have PCOS,  family history of premature CHD under age 8, diabetes, smoke, have hypertension or peripheral artery disease:  no At any time, has a partner hit, kicked or otherwise hurt or frightened you?: no Over the past 2 weeks, have you felt down, depressed or hopeless?: no Over the past 2 weeks, have you felt little interest or pleasure in doing things?:no   Gynecologic History Patient's last menstrual period was 10/10/2018 (exact date). Contraception: none Last Pap: 06-13-2018. Results were: normal Last mammogram: 08-30-2018. Results were: normal  Obstetric History OB History  Gravida Para Term Preterm AB Living  2       1    SAB TAB Ectopic Multiple Live Births  1            # Outcome Date GA Lbr Len/2nd Weight Sex Delivery Anes PTL Lv  2 SAB           1 Gravida              Birth Comments: System Generated. Please review and update pregnancy details.    Past Medical History:  Diagnosis Date  . Migraine     Past Surgical History:  Procedure Laterality Date  . DILATION AND EVACUATION N/A 05/02/2013   Procedure: SUCTION DILATATION AND EVACUATION;  Surgeon: Shelly Bombard, MD;  Location: [facility];  Service: Gynecology;  Laterality: N/A;  . KNEE SURGERY    . KNEE SURGERY    . KNEE SURGERY       Current Outpatient Medications:  .   aspirin-acetaminophen-caffeine (EXCEDRIN MIGRAINE) 250-250-65 MG tablet, Take 1 tablet by mouth daily as needed for headache or migraine., Disp: , Rfl:  .  cetirizine (ZYRTEC) 10 MG tablet, Take 1 tablet (10 mg total) by mouth daily., Disp: 90 tablet, Rfl: 3 .  Prenat w/o A-FeCb-FeGl-DSS-FA (CITRANATAL RX) 27-1 MG TABS, Take 1 tablet by mouth daily before breakfast., Disp: 30 tablet, Rfl: 11 Allergies  Allergen Reactions  . Other Hives    Seafood-hives    Social History   Tobacco Use  . Smoking status: Current Every Day Smoker    Packs/day: 0.50    Types: Cigarettes  . Smokeless tobacco: Never Used  Substance Use Topics  . Alcohol use: Yes    Alcohol/week: 0.0 standard drinks    Comment: occaisional     Family History  Problem Relation Age of Onset  . Hypertension Father   . Hypertension Maternal Grandmother   . Hypertension Sister       Review of Systems  Constitutional: negative for fatigue and weight loss Respiratory: negative for cough and wheezing Cardiovascular: negative for chest pain, fatigue and palpitations Gastrointestinal: negative for abdominal pain and change in bowel habits Musculoskeletal:negative for myalgias Neurological: negative for gait problems and tremors Behavioral/Psych: negative for abusive relationship, depression Endocrine: negative for temperature intolerance    Genitourinary:negative for abnormal menstrual periods,  genital lesions, hot flashes, sexual problems and vaginal discharge Integument/breast: negative for breast lump, breast tenderness, nipple discharge and skin lesion(s)    Objective:       BP 118/79   Pulse 93   Ht 5\' 2"  (1.575 m)   Wt 178 lb (80.7 kg)   LMP 10/10/2018 (Exact Date)   BMI 32.56 kg/m  General:   alert  Skin:   no rash or abnormalities  Lungs:   clear to auscultation bilaterally  Heart:   regular rate and rhythm, S1, S2 normal, no murmur, click, rub or gallop  Breasts:   normal without suspicious masses,  skin or nipple changes or axillary nodes  Abdomen:  normal findings: no organomegaly, soft, non-tender and no hernia  Pelvis:  External genitalia: normal general appearance Urinary system: urethral meatus normal and bladder without fullness, nontender Vaginal: normal without tenderness, induration or masses Cervix: normal appearance Adnexa: normal bimanual exam Uterus: anteverted and non-tender, normal size   Lab Review Urine pregnancy test Labs reviewed yes Radiologic studies reviewed yes  50% of 25 min visit spent on counseling and coordination of care.   Assessment:     1. Encounter for routine gynecological examination with Papanicolaou smear of cervix Rx: - Cytology - PAP( [place])  2. Vaginal discharge Rx: - Cervicovaginal ancillary only( [place])  3. Encounter for other general counseling or advice on contraception - declines contraception.  Trying to conceive for the last 5 years without success. - preconception counseling done and Folic Acid Rx  4. Disorder of ovulation Rx: - Ambulatory referral to Infertility   Plan:    Education reviewed: calcium supplements, depression evaluation, low fat, low cholesterol diet, safe sex/STD prevention, self breast exams and weight bearing exercise. Contraception: none. Follow up in: 1 year.   Meds ordered this encounter  Medications  . Prenat w/o A-FeCb-FeGl-DSS-FA (CITRANATAL RX) 27-1 MG TABS    Sig: Take 1 tablet by mouth daily before breakfast.    Dispense:  30 tablet    Refill:  11   Orders Placed This Encounter  Procedures  . Ambulatory referral to Infertility    Referral Priority:   Routine    Referral Type:   Consultation    Referral Reason:   Specialty Services Required    Requested Specialty:   Gynecology    Number of Visits Requested:   1    Shelly Bombard MD 10-19-2018

## 2018-10-19 NOTE — Progress Notes (Signed)
Presents for AEX/PAP.  Reports no problems today. Next Mammogram 08/30/2019.  GAD-7=1

## 2018-10-20 ENCOUNTER — Telehealth: Payer: Self-pay

## 2018-10-20 MED ORDER — PRENATAL PLUS 27-1 MG PO TABS: 1.0000 | ORAL_TABLET | Freq: Every day | ORAL | 11 refills | Status: DC

## 2018-10-20 MED ORDER — PRENATAL PLUS 27-1 MG PO TABS: 1.0000 | ORAL_TABLET | Freq: Every day | ORAL | 0 refills | Status: DC

## 2018-10-20 NOTE — Telephone Encounter (Signed)
Called and advised that provider sent another rx for PNV

## 2018-10-21 LAB — CYTOLOGY - PAP
Diagnosis: NEGATIVE
HPV: NOT DETECTED

## 2018-10-21 LAB — CERVICOVAGINAL ANCILLARY ONLY
Bacterial vaginitis: POSITIVE — AB
Candida vaginitis: NEGATIVE

## 2018-10-25 ENCOUNTER — Other Ambulatory Visit: Payer: Self-pay | Admitting: Obstetrics

## 2018-10-25 DIAGNOSIS — B9689 Other specified bacterial agents as the cause of diseases classified elsewhere: Secondary | ICD-10-CM

## 2018-10-25 DIAGNOSIS — N760 Acute vaginitis: Secondary | ICD-10-CM

## 2018-10-25 MED ORDER — TINIDAZOLE 500 MG PO TABS: 1000.0000 mg | ORAL_TABLET | Freq: Every day | ORAL | 2 refills | Status: DC

## 2018-11-01 ENCOUNTER — Telehealth: Payer: Self-pay

## 2018-11-01 NOTE — Telephone Encounter (Signed)
We have attempted to call the patient two times to schedule sleep study.  Patient has been unavailable at the phone numbers we have on file and has not returned our calls. If patient calls back we will schedule them for their sleep study.  

## 2019-01-27 DIAGNOSIS — N390 Urinary tract infection, site not specified: Secondary | ICD-10-CM | POA: Diagnosis not present

## 2019-05-05 ENCOUNTER — Other Ambulatory Visit: Payer: Self-pay

## 2019-05-05 MED ORDER — METRONIDAZOLE 500 MG PO TABS: 500.0000 mg | ORAL_TABLET | Freq: Two times a day (BID) | ORAL | 0 refills | Status: DC

## 2019-05-05 NOTE — Progress Notes (Signed)
Pt called with symptoms of BV for a couple days, odor and discharge. Rx flagyl sent per protocol for BV. Pt reports no drug allergies. Pt made aware of rx and advised to call back if symptoms do not improve, pt voices understanding.

## 2019-08-23 ENCOUNTER — Ambulatory Visit (INDEPENDENT_AMBULATORY_CARE_PROVIDER_SITE_OTHER): Payer: BC Managed Care – PPO | Admitting: Neurology

## 2019-08-23 DIAGNOSIS — Z87891 Personal history of nicotine dependence: Secondary | ICD-10-CM

## 2019-08-23 DIAGNOSIS — G4733 Obstructive sleep apnea (adult) (pediatric): Secondary | ICD-10-CM | POA: Diagnosis not present

## 2019-08-23 DIAGNOSIS — G4734 Idiopathic sleep related nonobstructive alveolar hypoventilation: Secondary | ICD-10-CM

## 2019-08-23 DIAGNOSIS — R0683 Snoring: Secondary | ICD-10-CM

## 2019-08-25 ENCOUNTER — Telehealth: Payer: Self-pay | Admitting: Neurology

## 2019-08-25 DIAGNOSIS — G4733 Obstructive sleep apnea (adult) (pediatric): Secondary | ICD-10-CM | POA: Insufficient documentation

## 2019-08-25 NOTE — Addendum Note (Signed)
Addended by: Larey Seat on: 08/25/2019 11:49 AM   Modules accepted: Orders

## 2019-08-25 NOTE — Progress Notes (Signed)
DIAGNOSIS  1. Severe Obstructive Sleep Apnea/ Hypopnea with Hypoxia resolved  under CPAP pressure. The patient produced mild snoring up to 9 cm  water pressure.  2. Normal EKG   PLANS/RECOMMENDATIONS: The patient was fitted with a ResMed  AirFit N30 SW cradle mask.  She will use an autotitration capable CPAP device with a setting  from 6 through 10 cm water, no EPR and heated humidity.

## 2019-08-25 NOTE — Procedures (Signed)
PATIENT'S NAME:  Wendy Howard, Wendy Howard DOB:      08-31-1970      MR#:    [id number]     DATE OF RECORDING: 08/23/2019 [place] REFERRING M.D.:  Grant Fontana MD Study Performed:   CPAP  Titration HISTORY:  [name] is returning for CPAP titration following her PSG performed on 10/10/18 which resulted in a diagnosis of Severe Sleep Apnea, Type OSA , predominantly manifesting in hypopneas. The AHI of 46.3, REM AHI of 71.1/h and 32 minutes of oxygen desaturation time were associated with a Nadir spo2 of 75%.  The patient endorsed the Epworth Sleepiness Scale at 4 points.    The patient's weight 170 pounds with a height of 63 (inches), resulting in a BMI of 30.1 kg/m2.  The patient's neck circumference measured 15 inches.  CURRENT MEDICATIONS: Excedrin, Zyrtec    PROCEDURE:  This is a multichannel digital polysomnogram utilizing the SomnoStar 11.2 system.  Electrodes and sensors were applied and monitored per AASM Specifications.   EEG, EOG, Chin and Limb EMG, were sampled at 200 Hz.  ECG, Snore and Nasal Pressure, Thermal Airflow, Respiratory Effort, CPAP Flow and Pressure, Oximetry was sampled at 50 Hz. Digital video and audio were recorded.      CPAP was initiated an AirFit N 30 cradle mask, SW, at 5 cmH20 with heated humidity per AASM split night standards and pressure was advanced to 10 cmH20 because of hypopneas, apneas and desaturations. At a PAP pressure of 10 cmH20, there was a reduction of the AHI to 0.0/h with improvement of sleep apnea.  Lights Out was at 22:15 and Lights On at 04:59. Total recording time (TRT) was 405 minutes, with a total sleep time (TST) of 321.5 minutes. The patient's sleep latency was 71.5 minutes. REM latency was 53.5 minutes.  The sleep efficiency was 79.4 %.    SLEEP ARCHITECTURE: WASO (Wake after sleep onset)  was 28 minutes.  There were 14.5 minutes in Stage N1, 80.5 minutes Stage N2, 159.5 minutes Stage N3 and 67 minutes in Stage REM.  The percentage of Stage N1  was 4.5%, Stage N2 was 25.%, Stage N3 was 49.6% and Stage R (REM sleep) was 20.8%. The sleep architecture was notable for REM sleep rebound.    RESPIRATORY ANALYSIS:  There was a total of 0 respiratory events: 0 obstructive apneas, 0 central apneas and 0 mixed apneas with a total of 0 apneas and an apnea index (AI) of 0 /hour. There were 0 hypopneas with a hypopnea index of 0/hour.  The total APNEA/HYPOPNEA INDEX (AHI) was 0 /hour .  0 events occurred in REM sleep and 0 events in NREM. The REM AHI was 0 /hour versus a non-REM AHI of 0 /hour.  The patient spent 149 minutes of total sleep time in the supine position and 173 minutes in non-supine.   OXYGEN SATURATION & C02:  The baseline 02 saturation was 96%, with the lowest being 89%. Time spent below 89% saturation equaled 0 minutes. The arousals were noted as: 34 were spontaneous, 0 were associated with PLMs, 0 were associated with respiratory events. The patient had a total of 0 Periodic Limb Movements.  Audio and video analysis did not show any abnormal or unusual movements, behaviors, phonations or vocalizations.   No bathroom breaks. Some Snoring was noted. EKG was in keeping with normal sinus rhythm (NSR).  DIAGNOSIS 1. Severe Obstructive Sleep Apnea/ Hypopnea with Hypoxia resolved under CPAP pressure. The patient produced mild snoring up to 9 cm  water pressure.  2. Normal EKG  PLANS/RECOMMENDATIONS: The patient was fitted with a ResMed AirFit N30 SW cradle mask. She will use an autotitration capable CPAP device with a setting from 6 through 10 cm water, no EPR and heated humidity.   DISCUSSION: A follow up appointment will be scheduled in the Sleep Clinic at [facility].   Please call [phone] with any questions.      I certify that I have reviewed the entire raw data recording prior to the issuance of this report in accordance with the Standards of Accreditation of the American Academy of Sleep Medicine  (AASM)   Larey Seat, M.D. Diplomat, Tax adviser of Psychiatry and Neurology  Diplomat, Tax adviser of Sleep Medicine Market researcher, [facility] [employer] [facility] [employer]

## 2019-08-25 NOTE — Telephone Encounter (Signed)
I called pt. I advised pt that Dr. Brett Fairy reviewed their sleep study results and found that pt has sleep apnea. Dr. Brett Fairy recommends that pt starts auto CPAP. I reviewed PAP compliance expectations with the pt. Pt is agreeable to starting a CPAP. I advised pt that an order will be sent to a DME, [facility], and [facility] will call the pt within about one week after they file with the pt's insurance. [facility] will show the pt how to use the machine, fit for masks, and troubleshoot the CPAP if needed. A follow up appt was made for insurance purposes with Dr. Brett Fairy on Aug 12,2021 at 3:30 pm. Pt verbalized understanding to arrive 15 minutes early and bring their CPAP. A letter with all of this information in it will be mailed to the pt as a reminder. I verified with the pt that the address we have on file is correct. Pt verbalized understanding of results. Pt had no questions at this time but was encouraged to call back if questions arise. I have sent the order to [facility] and have received confirmation that they have received the order.

## 2019-09-01 ENCOUNTER — Ambulatory Visit (INDEPENDENT_AMBULATORY_CARE_PROVIDER_SITE_OTHER): Payer: BC Managed Care – PPO | Admitting: Family Medicine

## 2019-09-01 ENCOUNTER — Other Ambulatory Visit: Payer: Self-pay

## 2019-09-01 ENCOUNTER — Encounter: Payer: Self-pay | Admitting: Family Medicine

## 2019-09-01 VITALS — BP 124/83 | HR 89 | Temp 98.0°F | Ht 62.0 in | Wt 174.8 lb

## 2019-09-01 DIAGNOSIS — Z13220 Encounter for screening for lipoid disorders: Secondary | ICD-10-CM

## 2019-09-01 DIAGNOSIS — N926 Irregular menstruation, unspecified: Secondary | ICD-10-CM

## 2019-09-01 DIAGNOSIS — Z130 Encounter for screening for diseases of the blood and blood-forming organs and certain disorders involving the immune mechanism: Secondary | ICD-10-CM

## 2019-09-01 DIAGNOSIS — N760 Acute vaginitis: Secondary | ICD-10-CM

## 2019-09-01 DIAGNOSIS — Z0001 Encounter for general adult medical examination with abnormal findings: Secondary | ICD-10-CM

## 2019-09-01 DIAGNOSIS — Z1329 Encounter for screening for other suspected endocrine disorder: Secondary | ICD-10-CM | POA: Diagnosis not present

## 2019-09-01 DIAGNOSIS — F17200 Nicotine dependence, unspecified, uncomplicated: Secondary | ICD-10-CM | POA: Diagnosis not present

## 2019-09-01 DIAGNOSIS — Z13228 Encounter for screening for other metabolic disorders: Secondary | ICD-10-CM | POA: Diagnosis not present

## 2019-09-01 DIAGNOSIS — B9689 Other specified bacterial agents as the cause of diseases classified elsewhere: Secondary | ICD-10-CM

## 2019-09-01 DIAGNOSIS — Z0000 Encounter for general adult medical examination without abnormal findings: Secondary | ICD-10-CM

## 2019-09-01 DIAGNOSIS — N898 Other specified noninflammatory disorders of vagina: Secondary | ICD-10-CM | POA: Diagnosis not present

## 2019-09-01 DIAGNOSIS — Z1211 Encounter for screening for malignant neoplasm of colon: Secondary | ICD-10-CM

## 2019-09-01 LAB — POCT URINE PREGNANCY: Preg Test, Ur: NEGATIVE

## 2019-09-01 LAB — POCT WET + KOH PREP
Trich by wet prep: ABSENT
Yeast by KOH: ABSENT
Yeast by wet prep: ABSENT

## 2019-09-01 MED ORDER — CETIRIZINE HCL 10 MG PO TABS: 10.0000 mg | ORAL_TABLET | Freq: Every day | ORAL | 3 refills | Status: DC

## 2019-09-01 MED ORDER — METRONIDAZOLE 500 MG PO TABS: 500.0000 mg | ORAL_TABLET | Freq: Two times a day (BID) | ORAL | 0 refills | Status: DC

## 2019-09-01 MED ORDER — BUPROPION HCL ER (SR) 150 MG PO TB12: 150.0000 mg | ORAL_TABLET | Freq: Two times a day (BID) | ORAL | 2 refills | Status: DC

## 2019-09-01 NOTE — Patient Instructions (Addendum)
pcp      If you have lab work done today you will be contacted with your lab results within the next 2 weeks.  If you have not heard from [place] then please contact us. The fastest way to get your results is to register for My Chart.   IF you received an x-ray today, you will receive an invoice from [facility]. Please contact [facility] at [phone] with questions or concerns regarding your invoice.   IF you received labwork today, you will receive an invoice from [facility]. Please contact [facility] at [phone] with questions or concerns regarding your invoice.   Our billing staff will not be able to assist you with questions regarding bills from these companies.  You will be contacted with the lab results as soon as they are available. The fastest way to get your results is to activate your My Chart account. Instructions are located on the last page of this paperwork. If you have not heard from [place] regarding the results in 2 weeks, please contact this office.     Preventive Care 53-68 Years Old, Female Preventive care refers to visits with your health care provider and lifestyle choices that can promote health and wellness. This includes:  A yearly physical exam. This may also be called an annual well check.  Regular dental visits and eye exams.  Immunizations.  Screening for certain conditions.  Healthy lifestyle choices, such as eating a healthy diet, getting regular exercise, not using drugs or products that contain nicotine and tobacco, and limiting alcohol use. What can I expect for my preventive care visit? Physical exam Your health care provider will check your:  Height and weight. This may be used to calculate body mass index (BMI), which tells if you are at a healthy weight.  Heart rate and blood pressure.  Skin for abnormal spots. Counseling Your health care provider may ask you questions about your:  Alcohol, tobacco, and drug  use.  Emotional well-being.  Home and relationship well-being.  Sexual activity.  Eating habits.  Work and work Statistician.  Method of birth control.  Menstrual cycle.  Pregnancy history. What immunizations do I need?  Influenza (flu) vaccine  This is recommended every year. Tetanus, diphtheria, and pertussis (Tdap) vaccine  You may need a Td booster every 10 years. Varicella (chickenpox) vaccine  You may need this if you have not been vaccinated. Zoster (shingles) vaccine  You may need this after age 50. Measles, mumps, and rubella (MMR) vaccine  You may need at least one dose of MMR if you were born in 1957 or later. You may also need a second dose. Pneumococcal conjugate (PCV13) vaccine  You may need this if you have certain conditions and were not previously vaccinated. Pneumococcal polysaccharide (PPSV23) vaccine  You may need one or two doses if you smoke cigarettes or if you have certain conditions. Meningococcal conjugate (MenACWY) vaccine  You may need this if you have certain conditions. Hepatitis A vaccine  You may need this if you have certain conditions or if you travel or work in places where you may be exposed to hepatitis A. Hepatitis B vaccine  You may need this if you have certain conditions or if you travel or work in places where you may be exposed to hepatitis B. Haemophilus influenzae type b (Hib) vaccine  You may need this if you have certain conditions. Human papillomavirus (HPV) vaccine  If recommended by your health care provider, you may need three doses  over 6 months. You may receive vaccines as individual doses or as more than one vaccine together in one shot (combination vaccines). Talk with your health care provider about the risks and benefits of combination vaccines. What tests do I need? Blood tests  Lipid and cholesterol levels. These may be checked every 5 years, or more frequently if you are over 40 years  old.  Hepatitis C test.  Hepatitis B test. Screening  Lung cancer screening. You may have this screening every year starting at age 53 if you have a 30-pack-year history of smoking and currently smoke or have quit within the past 15 years.  Colorectal cancer screening. All adults should have this screening starting at age 66 and continuing until age 86. Your health care provider may recommend screening at age 63 if you are at increased risk. You will have tests every 1-10 years, depending on your results and the type of screening test.  Diabetes screening. This is done by checking your blood sugar (glucose) after you have not eaten for a while (fasting). You may have this done every 1-3 years.  Mammogram. This may be done every 1-2 years. Talk with your health care provider about when you should start having regular mammograms. This may depend on whether you have a family history of breast cancer.  BRCA-related cancer screening. This may be done if you have a family history of breast, ovarian, tubal, or peritoneal cancers.  Pelvic exam and Pap test. This may be done every 3 years starting at age 22. Starting at age 39, this may be done every 5 years if you have a Pap test in combination with an HPV test. Other tests  Sexually transmitted disease (STD) testing.  Bone density scan. This is done to screen for osteoporosis. You may have this scan if you are at high risk for osteoporosis. Follow these instructions at home: Eating and drinking  Eat a diet that includes fresh fruits and vegetables, whole grains, lean protein, and low-fat dairy.  Take vitamin and mineral supplements as recommended by your health care provider.  Do not drink alcohol if: ? Your health care provider tells you not to drink. ? You are pregnant, may be pregnant, or are planning to become pregnant.  If you drink alcohol: ? Limit how much you have to 0-1 drink a day. ? Be aware of how much alcohol is in your  drink. In the [place] one drink equals one 12 oz bottle of beer (355 mL), one 5 oz glass of wine (148 mL), or one 1 oz glass of hard liquor (44 mL). Lifestyle  Take daily care of your teeth and gums.  Stay active. Exercise for at least 30 minutes on 5 or more days each week.  Do not use any products that contain nicotine or tobacco, such as cigarettes, e-cigarettes, and chewing tobacco. If you need help quitting, ask your health care provider.  If you are sexually active, practice safe sex. Use a condom or other form of birth control (contraception) in order to prevent pregnancy and STIs (sexually transmitted infections).  If told by your health care provider, take low-dose aspirin daily starting at age 73. What's next?  Visit your health care provider once a year for a well check visit.  Ask your health care provider how often you should have your eyes and teeth checked.  Stay up to date on all vaccines. This information is not intended to replace advice given to you by your health care  provider. Make sure you discuss any questions you have with your health care provider. Document Revised: 11/26/2017 Document Reviewed: 11/26/2017 Elsevier Patient Education  2020 [employer].

## 2019-09-01 NOTE — Progress Notes (Signed)
6/3/20219:44 AM  Wendy Howard 1970-05-29, 49 y.o., female [id number]  Chief Complaint  Patient presents with  . Annual Exam    HPI:   Patient is a 49 y.o. female with OSA, seasonal allergies and menstrual migraines who presents today for CPE  Last CPE May 2020 Cervical Cancer Screening: July 2020, per obgyn, Dr Jodi Mourning Breast Cancer Screening: June 2020 per obgyn Colorectal Cancer Screening: due per new guidelines Bone Density Testing: at age 64 HIV Screening: 2015 STI Screening: July 2020  She is having smelly vaginal discharge, comes and goes No cramping or pelvic pain, no nausea or vomiting, no fever or chills Thinks she has BV Regular menses, late for past 2 months LMP May 7th, she reports her cycle is usually 25 days long Does not use BC  Waiting cpap arrival  Most Recent Immunizations  Administered Date(s) Administered  . Influenza-Unspecified 01/20/2019  . PFIZER SARS-COV-2 Vaccination 07/15/2019  . Tdap 07/16/2017   Pneumococcal Vaccination: at age 69 Zoster Vaccination: at age 56 Frequency of Dental evaluation: needs to schedule Frequency of Eye evaluation: has appt later this week, uses eyeglasses   Hearing Screening   '125Hz'$  '250Hz'$  '500Hz'$  '1000Hz'$  '2000Hz'$  '3000Hz'$  '4000Hz'$  '6000Hz'$  '8000Hz'$   Right ear:           Left ear:             Visual Acuity Screening   Right eye Left eye Both eyes  Without correction:     With correction: '20/20 20/20 20/20 '$   Walks for exercise Started smoking again about a month ago due to stress, 1/4 ppd day   Depression screen [facility] 2/9 09/01/2019 07/16/2017 05/20/2017  Decreased Interest 0 0 0  Down, Depressed, Hopeless 0 0 0  PHQ - 2 Score 0 0 0    Fall Risk  09/01/2019 07/16/2017 05/20/2017  Falls in the past year? 0 No No  Number falls in past yr: 0 - -  Injury with Fall? 0 - -     Allergies  Allergen Reactions  . Other Hives    Seafood-hives    Prior to Admission medications   Medication Sig Start Date End  Date Taking? Authorizing Provider  aspirin-acetaminophen-caffeine (EXCEDRIN MIGRAINE) [phone] MG tablet Take 1 tablet by mouth daily as needed for headache or migraine.   Yes [provider]  cetirizine (ZYRTEC) 10 MG tablet Take 1 tablet (10 mg total) by mouth daily. 08/02/18  Yes Rutherford Guys, MD    Past Medical History:  Diagnosis Date  . Migraine     Past Surgical History:  Procedure Laterality Date  . DILATION AND EVACUATION N/A 05/02/2013   Procedure: SUCTION DILATATION AND EVACUATION;  Surgeon: Shelly Bombard, MD;  Location: [facility];  Service: Gynecology;  Laterality: N/A;  . KNEE SURGERY    . KNEE SURGERY    . KNEE SURGERY      Social History   Tobacco Use  . Smoking status: Current Every Day Smoker    Packs/day: 0.50    Types: Cigarettes  . Smokeless tobacco: Never Used  Substance Use Topics  . Alcohol use: Yes    Alcohol/week: 0.0 standard drinks    Comment: occaisional     Family History  Problem Relation Age of Onset  . Hypertension Father   . Hypertension Maternal Grandmother   . Hypertension Sister     Review of Systems  Constitutional: Negative for chills and fever.  Respiratory: Negative for cough and shortness of breath.  Cardiovascular: Negative for chest pain, palpitations and leg swelling.  Gastrointestinal: Negative for abdominal pain, blood in stool, constipation, diarrhea, melena, nausea and vomiting.  Genitourinary: Negative for dysuria, frequency, hematuria and urgency.  Neurological: Positive for headaches.  Endo/Heme/Allergies: Positive for environmental allergies.  All other systems reviewed and are negative.  Per hpi  OBJECTIVE:  Today's Vitals   09/01/19 0931  BP: 124/83  Pulse: 89  Temp: 98 F (36.7 C)  SpO2: 97%  Weight: 174 lb 12.8 oz (79.3 kg)  Height: '5\' 2"'$  (1.575 m)   Body mass index is 31.97 kg/m.   Physical Exam Vitals and nursing note reviewed.  Constitutional:      Appearance: She is  well-developed.  HENT:     Head: Normocephalic and atraumatic.     Right Ear: Hearing, tympanic membrane, ear canal and external ear normal.     Left Ear: Hearing, tympanic membrane, ear canal and external ear normal.     Mouth/Throat:     Mouth: Mucous membranes are moist.     Pharynx: No oropharyngeal exudate or posterior oropharyngeal erythema.  Eyes:     Extraocular Movements: Extraocular movements intact.     Conjunctiva/sclera: Conjunctivae normal.     Pupils: Pupils are equal, round, and reactive to light.  Neck:     Thyroid: No thyromegaly.  Cardiovascular:     Rate and Rhythm: Normal rate and regular rhythm.     Heart sounds: Normal heart sounds. No murmur. No friction rub. No gallop.   Pulmonary:     Effort: Pulmonary effort is normal.     Breath sounds: Normal breath sounds. No wheezing, rhonchi or rales.  Abdominal:     General: Bowel sounds are normal. There is no distension.     Palpations: Abdomen is soft. There is no hepatomegaly, splenomegaly or mass.     Tenderness: There is no abdominal tenderness.  Musculoskeletal:        General: Normal range of motion.     Cervical back: Neck supple.     Right lower leg: No edema.     Left lower leg: No edema.  Lymphadenopathy:     Cervical: No cervical adenopathy.  Skin:    General: Skin is warm and dry.  Neurological:     Mental Status: She is alert and oriented to person, place, and time.     Cranial Nerves: No cranial nerve deficit.     Gait: Gait normal.     Deep Tendon Reflexes: Reflexes are normal and symmetric.  Psychiatric:        Mood and Affect: Mood normal.        Behavior: Behavior normal.     Results for orders placed or performed in visit on 09/01/19 (from the past 24 hour(s))  POCT Wet + KOH Prep     Status: Abnormal   Collection Time: 09/01/19 10:20 AM  Result Value Ref Range   Yeast by KOH Absent Absent   Yeast by wet prep Absent Absent   WBC by wet prep None (A) Few   Clue Cells Wet Prep HPF  POC Moderate (A) None   Trich by wet prep Absent Absent   Bacteria Wet Prep HPF POC Moderate (A) Few   Epithelial Cells By [employer] Pref ([facility]) Many (A) None, Few, Too numerous to count   RBC,UR,HPF,POC None None RBC/hpf  POCT urine pregnancy     Status: None   Collection Time: 09/01/19 10:21 AM  Result Value Ref Range   Preg  Test, Ur Negative Negative    No results found.   ASSESSMENT and PLAN  1. Annual physical exam Routine HCM labs ordered. HCM reviewed/discussed. Anticipatory guidance regarding healthy weight, lifestyle and choices given.   2. Screening for metabolic disorder - TAV69+VXYI  3. Screening for thyroid disorder - TSH  4. Screening cholesterol level - Lipid panel  5. Screening for deficiency anemia - CBC  6. Colon cancer screening Discussed new guidelines, reviewed various options, patient will think about it  7. Vaginal discharge - POCT Wet + KOH Prep + BV  8. Menstrual period late - POCT urine pregnancy - negative  9. Tobacco use disorder Smoking cessation instruction/counseling given for 5-10 minutes:  counseled patient on the dangers of tobacco use, advised patient to stop smoking, and reviewed strategies to maximize success.  Trial of  Wellbutrin, reviewed r/se/b  10. BV (bacterial vaginosis) rx for flagyl, discuss with obgyn due to recurrence  Other orders - buPROPion (WELLBUTRIN SR) 150 MG 12 hr tablet; Take 1 tablet (150 mg total) by mouth 2 (two) times daily. - cetirizine (ZYRTEC) 10 MG tablet; Take 1 tablet (10 mg total) by mouth daily. - metroNIDAZOLE (FLAGYL) 500 MG tablet; Take 1 tablet (500 mg total) by mouth 2 (two) times daily.  Return in about 1 year (around 08/31/2020).    Rutherford Guys, MD [facility] at [facility], [place] [address] Ph.  [phone] Fax [phone]

## 2019-09-02 LAB — CBC
Hematocrit: 40.6 % (ref 34.0–46.6)
Hemoglobin: 13 g/dL (ref 11.1–15.9)
MCH: 27.5 pg (ref 26.6–33.0)
MCHC: 32 g/dL (ref 31.5–35.7)
MCV: 86 fL (ref 79–97)
Platelets: 245 10*3/uL (ref 150–450)
RBC: 4.73 x10E6/uL (ref 3.77–5.28)
RDW: 12.2 % (ref 11.7–15.4)
WBC: 7.8 10*3/uL (ref 3.4–10.8)

## 2019-09-02 LAB — CMP14+EGFR
ALT: 8 IU/L (ref 0–32)
AST: 9 IU/L (ref 0–40)
Albumin/Globulin Ratio: 1.4 (ref 1.2–2.2)
Albumin: 3.9 g/dL (ref 3.8–4.8)
Alkaline Phosphatase: 86 IU/L (ref 48–121)
BUN/Creatinine Ratio: 18 (ref 9–23)
BUN: 14 mg/dL (ref 6–24)
Bilirubin Total: 0.4 mg/dL (ref 0.0–1.2)
CO2: 22 mmol/L (ref 20–29)
Calcium: 8.8 mg/dL (ref 8.7–10.2)
Chloride: 103 mmol/L (ref 96–106)
Creatinine, Ser: 0.78 mg/dL (ref 0.57–1.00)
GFR calc Af Amer: 105 mL/min/{1.73_m2} (ref >59)
GFR calc non Af Amer: 91 mL/min/{1.73_m2} (ref >59)
Globulin, Total: 2.8 g/dL (ref 1.5–4.5)
Glucose: 92 mg/dL (ref 65–99)
Potassium: 4.3 mmol/L (ref 3.5–5.2)
Sodium: 137 mmol/L (ref 134–144)
Total Protein: 6.7 g/dL (ref 6.0–8.5)

## 2019-09-02 LAB — LIPID PANEL
Chol/HDL Ratio: 5.1 ratio — ABNORMAL HIGH (ref 0.0–4.4)
Cholesterol, Total: 163 mg/dL (ref 100–199)
HDL: 32 mg/dL — ABNORMAL LOW (ref >39)
LDL Chol Calc (NIH): 108 mg/dL — ABNORMAL HIGH (ref 0–99)
Triglycerides: 126 mg/dL (ref 0–149)
VLDL Cholesterol Cal: 23 mg/dL (ref 5–40)

## 2019-09-02 LAB — TSH: TSH: 2.37 u[IU]/mL (ref 0.450–4.500)

## 2019-09-09 ENCOUNTER — Other Ambulatory Visit: Payer: Self-pay | Admitting: Obstetrics

## 2019-09-09 DIAGNOSIS — Z1231 Encounter for screening mammogram for malignant neoplasm of breast: Secondary | ICD-10-CM

## 2019-09-15 DIAGNOSIS — G4733 Obstructive sleep apnea (adult) (pediatric): Secondary | ICD-10-CM | POA: Diagnosis not present

## 2019-09-16 ENCOUNTER — Encounter: Payer: Self-pay | Admitting: Family Medicine

## 2019-09-16 ENCOUNTER — Other Ambulatory Visit: Payer: Self-pay | Admitting: Family Medicine

## 2019-09-16 MED ORDER — FLUCONAZOLE 150 MG PO TABS: 150.0000 mg | ORAL_TABLET | Freq: Once | ORAL | 0 refills | Status: AC

## 2019-09-16 NOTE — Telephone Encounter (Signed)
Medication Refill - Medication: Pt called reporting a yeast infection in response to the antibiotic she was prescribed. Seeking Rx to treat yeast infection, says she is itching really bad.  Has the patient contacted their pharmacy? Yes.   (Agent: If no, request that the patient contact the pharmacy for the refill.) (Agent: If yes, when and what did the pharmacy advise?)  Preferred Pharmacy (with phone number or street name):  [facility] [address]  [place] [address]  Phone: [phone] Fax: [phone]     Agent: Please be advised that RX refills may take up to 3 business days. We ask that you follow-up with your pharmacy.

## 2019-09-16 NOTE — Telephone Encounter (Signed)
Rx sent.t hanks

## 2019-09-16 NOTE — Telephone Encounter (Signed)
Please advise  if you are willing to send in anything for a yeast infection. Pt was last seen on 09/01/19 and was given Flagyl then.

## 2019-09-19 NOTE — Telephone Encounter (Signed)
Called and left a voicemail letting patient know medication was sent.

## 2019-09-27 ENCOUNTER — Ambulatory Visit
Admission: RE | Admit: 2019-09-27 | Discharge: 2019-09-27 | Disposition: A | Discharge status: Home / Self Care | Payer: BC Managed Care – PPO | Source: Ambulatory Visit | Attending: Obstetrics | Admitting: Obstetrics

## 2019-09-27 ENCOUNTER — Other Ambulatory Visit: Payer: Self-pay

## 2019-09-27 DIAGNOSIS — Z1231 Encounter for screening mammogram for malignant neoplasm of breast: Secondary | ICD-10-CM

## 2019-09-28 ENCOUNTER — Telehealth: Payer: Self-pay | Admitting: Family Medicine

## 2019-09-28 NOTE — Telephone Encounter (Signed)
These are question I can not answer and will forward msg to Dr Pamella Pert.

## 2019-09-28 NOTE — Telephone Encounter (Signed)
Copied from [place] [phone]. Topic: General - Other >> Sep 28, 2019 11:06 AM Rainey Pines A wrote: Patient is requesting a callback from Dr. Pamella Pert nurse in regards to he excedrin migraine medication no longer working for her. Patient wants to know if a stronger medication can be called in for her and wants to know her options on taking sumatriptan 50 mg. Please advise

## 2019-09-29 NOTE — Telephone Encounter (Signed)
Patient called back today to see about what she should do. An appointment with another provider was offered although she stated that her insurance will not cover.

## 2019-09-29 NOTE — Telephone Encounter (Signed)
Agree patient needs appt to further discuss migraines as we have never discussed in depth.

## 2019-09-29 NOTE — Telephone Encounter (Signed)
This pt needs an appointment with a provider to discuss migraine options, please schedule her with whomever shes comfortable

## 2019-10-08 NOTE — Telephone Encounter (Signed)
Patient seen once by me for her CPE. Reviewed that she had history of migraines and that she took excedrin for these but did not have in depth conversation.  Patient will be seeking new primary care, they can address future treatments for migraines.

## 2019-10-15 DIAGNOSIS — G4733 Obstructive sleep apnea (adult) (pediatric): Secondary | ICD-10-CM | POA: Diagnosis not present

## 2019-10-21 ENCOUNTER — Ambulatory Visit: Payer: BC Managed Care – PPO | Admitting: Obstetrics

## 2019-10-26 ENCOUNTER — Ambulatory Visit: Payer: BC Managed Care – PPO | Admitting: Obstetrics

## 2019-10-31 ENCOUNTER — Ambulatory Visit (INDEPENDENT_AMBULATORY_CARE_PROVIDER_SITE_OTHER): Payer: BC Managed Care – PPO | Admitting: Obstetrics

## 2019-10-31 ENCOUNTER — Encounter: Payer: Self-pay | Admitting: Obstetrics

## 2019-10-31 ENCOUNTER — Other Ambulatory Visit (HOSPITAL_COMMUNITY)
Admission: RE | Admit: 2019-10-31 | Discharge: 2019-10-31 | Disposition: A | Discharge status: Home / Self Care | Payer: BC Managed Care – PPO | Source: Ambulatory Visit | Attending: Obstetrics | Admitting: Obstetrics

## 2019-10-31 ENCOUNTER — Other Ambulatory Visit: Payer: Self-pay

## 2019-10-31 VITALS — BP 130/80 | HR 107 | Ht 62.0 in | Wt 174.4 lb

## 2019-10-31 DIAGNOSIS — Z6831 Body mass index (BMI) 31.0-31.9, adult: Secondary | ICD-10-CM | POA: Diagnosis not present

## 2019-10-31 DIAGNOSIS — F17210 Nicotine dependence, cigarettes, uncomplicated: Secondary | ICD-10-CM | POA: Diagnosis not present

## 2019-10-31 DIAGNOSIS — Z01419 Encounter for gynecological examination (general) (routine) without abnormal findings: Secondary | ICD-10-CM | POA: Diagnosis not present

## 2019-10-31 DIAGNOSIS — E669 Obesity, unspecified: Secondary | ICD-10-CM | POA: Diagnosis not present

## 2019-10-31 DIAGNOSIS — F17200 Nicotine dependence, unspecified, uncomplicated: Secondary | ICD-10-CM

## 2019-10-31 NOTE — Progress Notes (Signed)
Subjective:        Wendy Howard is a 49 y.o. female here for a routine exam.  Current complaints: No period this past month.    Personal health questionnaire:  Is patient [name], have a family history of breast and/or ovarian cancer: no Is there a family history of uterine cancer diagnosed at age < 67, gastrointestinal cancer, urinary tract cancer, family member who is a Field seismologist syndrome-associated carrier: no Is the patient overweight and hypertensive, family history of diabetes, personal history of gestational diabetes, preeclampsia or PCOS: yes Is patient over 79, have PCOS,  family history of premature CHD under age 69, diabetes, smoke, have hypertension or peripheral artery disease:  no At any time, has a partner hit, kicked or otherwise hurt or frightened you?: no Over the past 2 weeks, have you felt down, depressed or hopeless?: no Over the past 2 weeks, have you felt little interest or pleasure in doing things?:no   Gynecologic History Patient's last menstrual period was 09/26/2019. Contraception: none Last Pap: 10-19-2018. Results were: normal Last mammogram: 09-27-2019. Results were: normal  Obstetric History OB History  Gravida Para Term Preterm AB Living  2       1    SAB TAB Ectopic Multiple Live Births  1            # Outcome Date GA Lbr Len/2nd Weight Sex Delivery Anes PTL Lv  2 SAB           1 Gravida              Birth Comments: System Generated. Please review and update pregnancy details.    Past Medical History:  Diagnosis Date  . Migraine     Past Surgical History:  Procedure Laterality Date  . DILATION AND EVACUATION N/A 05/02/2013   Procedure: SUCTION DILATATION AND EVACUATION;  Surgeon: Shelly Bombard, MD;  Location: [facility];  Service: Gynecology;  Laterality: N/A;  . KNEE SURGERY    . KNEE SURGERY    . KNEE SURGERY       Current Outpatient Medications:  .  aspirin-acetaminophen-caffeine (EXCEDRIN MIGRAINE) 250-250-65  MG tablet, Take 1 tablet by mouth daily as needed for headache or migraine., Disp: , Rfl:  .  buPROPion (WELLBUTRIN SR) 150 MG 12 hr tablet, Take 1 tablet (150 mg total) by mouth 2 (two) times daily., Disp: 60 tablet, Rfl: 2 .  cetirizine (ZYRTEC) 10 MG tablet, Take 1 tablet (10 mg total) by mouth daily., Disp: 90 tablet, Rfl: 3 .  metroNIDAZOLE (FLAGYL) 500 MG tablet, Take 1 tablet (500 mg total) by mouth 2 (two) times daily. (Patient not taking: Reported on 10/31/2019), Disp: 14 tablet, Rfl: 0 Allergies  Allergen Reactions  . Other Hives    Seafood-hives    Social History   Tobacco Use  . Smoking status: Current Every Day Smoker    Packs/day: 0.50    Types: Cigarettes  . Smokeless tobacco: Never Used  Substance Use Topics  . Alcohol use: Yes    Alcohol/week: 0.0 standard drinks    Comment: occaisional     Family History  Problem Relation Age of Onset  . Hypertension Father   . Hypertension Maternal Grandmother   . Hypertension Sister       Review of Systems  Constitutional: negative for fatigue and weight loss Respiratory: negative for cough and wheezing Cardiovascular: negative for chest pain, fatigue and palpitations Gastrointestinal: negative for abdominal pain and change in bowel habits Musculoskeletal:negative for  myalgias Neurological: negative for gait problems and tremors Behavioral/Psych: negative for abusive relationship, depression Endocrine: negative for temperature intolerance    Genitourinary:negative for abnormal menstrual periods, genital lesions, hot flashes, sexual problems and vaginal discharge Integument/breast: negative for breast lump, breast tenderness, nipple discharge and skin lesion(s)    Objective:       BP 130/80   Pulse (!) 107   Ht 5\' 2"  (1.575 m)   Wt 174 lb 6.4 oz (79.1 kg)   LMP 09/26/2019   BMI 31.90 kg/m  General:   alert and no distress  Skin:   no rash or abnormalities  Lungs:   clear to auscultation bilaterally  Heart:    regular rate and rhythm, S1, S2 normal, no murmur, click, rub or gallop  Breasts:   normal without suspicious masses, skin or nipple changes or axillary nodes  Abdomen:  normal findings: no organomegaly, soft, non-tender and no hernia  Pelvis:  External genitalia: normal general appearance Urinary system: urethral meatus normal and bladder without fullness, nontender Vaginal: normal without tenderness, induration or masses Cervix: normal appearance Adnexa: normal bimanual exam Uterus: anteverted and non-tender, normal size   Lab Review Urine pregnancy test Labs reviewed yes Radiologic studies reviewed no  50% of 20 min visit spent on counseling and coordination of care.   Assessment:     1. Encounter for gynecological examination with Papanicolaou smear of cervix Rx: - Cytology - PAP( [facility])  2. Obesity (BMI 30.0-34.9) - program of caloric reduction, exercise and behavioral modification recommended  3. Tobacco dependence - cessation with the aid of medication and behavioral modification recommended    Plan:    Education reviewed: calcium supplements, depression evaluation, low fat, low cholesterol diet, safe sex/STD prevention, self breast exams, smoking cessation and weight bearing exercise. Contraception: none. Follow up in: 1 year.    Shelly Bombard, MD 10/31/2019 3:54 PM

## 2019-10-31 NOTE — Progress Notes (Signed)
Pt is in the office for annual.  Last pap 10-19-18 Last mammogram 09-27-19 GAD-7=3

## 2019-11-02 LAB — CYTOLOGY - PAP
Comment: NEGATIVE
Diagnosis: NEGATIVE
High risk HPV: NEGATIVE

## 2019-11-15 DIAGNOSIS — G4733 Obstructive sleep apnea (adult) (pediatric): Secondary | ICD-10-CM | POA: Diagnosis not present

## 2019-11-23 ENCOUNTER — Other Ambulatory Visit: Payer: Self-pay

## 2019-11-23 ENCOUNTER — Encounter: Payer: Self-pay | Admitting: Family Medicine

## 2019-11-23 ENCOUNTER — Ambulatory Visit (INDEPENDENT_AMBULATORY_CARE_PROVIDER_SITE_OTHER): Payer: BC Managed Care – PPO | Admitting: Family Medicine

## 2019-11-23 VITALS — BP 124/70 | HR 88 | Ht 62.0 in | Wt 168.6 lb

## 2019-11-23 DIAGNOSIS — Z9989 Dependence on other enabling machines and devices: Secondary | ICD-10-CM | POA: Diagnosis not present

## 2019-11-23 DIAGNOSIS — G4733 Obstructive sleep apnea (adult) (pediatric): Secondary | ICD-10-CM

## 2019-11-23 NOTE — Progress Notes (Signed)
PATIENT: Wendy Howard DOB: 02/09/1971  REASON FOR VISIT: follow up HISTORY FROM: patient  Chief Complaint  Patient presents with  . Follow-up    F/U for OSA. States she has been using machine, despite getting a message that she needs to adjust mask.   . room 2    alone      HISTORY OF PRESENT ILLNESS: Today 11/23/19 Wendy Howard is a 49 y.o. female here today for follow up of recently diagnosed OSA started on CPAP. Split night sleep study on 10/10/2018 showed "1. Severe Obstructive Sleep Apnea (OSA) with an AHI of 46.3/h and  in REM sleep exacerbated to 71.1/h and supine 55.6/h. 2. Severe Primary Snoring. 3. Intermittent hypoxemia". She was started on CPAP following CPAP titration on 08/23/2019. She reports that she is doing well. She has noted improvement in sleep quality since starting CPAP therapy. She is using a nasal pillow. She is working with her mask and headgear to correct noted leak.   Compliance report dated 10/23/2019 through 11/21/2019 reveals that she used CPAP 30 of the past 30 days for compliance of 100%.  She used CPAP for greater than 4 hours all 30 days.  Average usage was 7 hours and 22 minutes.  Residual AHI was 1.4 on 6 to 10 cm of water and an EPR of 1.  There was a leak noted in the 95th percentile of 19.2.   HISTORY: (copied from Dr Dohmeier's note on 09/08/2018)  HPI:  Wendy Howard is a 49 y.o. female of AA descent seen on 09-08-2018 by video  in a referral  from Dr. Pamella Pert for a sleep apnea evaluation.   Chief complaint according to patient : my husband and son have stated I snore loudly".    Sleep and medical history: [name] was referred by her primary care physician at primary care at [facility], Dr. Grant Fontana.  She was seen for her regular annual physical and a screening for metabolic disorders was performed, including thyroid and cholesterol levels, Zyrtec was prescribed or recommended to be taken for  allergic rhinitis.  The patient also brought up that she is snoring loudly with witnessed breathing disturbance according to her husband's observation.  The visit was a non-face-to-face visit. She has been seen in the ED with tachycardia and reports migraine headaches and morning headaches. Migraine-Symptoms began about 5yearsago. Home treatment has includedexcedrin, which will help.Status post Knee surgery 2015.      Family sleep and medical history: no family member with OSA known to her , she has 2 sisters- healthy , father still alive at age 68 and HTN . Mother died in an accident. Maternal GF died of colon cancer, had HTN.  Social history: smoker 10 cig a day. ETOH socially, 2/monthly.  Caffeine : 2 in AM, iced tea in evening , pepsi 4 /month.  Household consists of her and husband. No children. No pets. 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  walking of exercise. No Hobbies named. Likes cheerleading.   Sleep habits are as follows: see notes During the coronavirus shutdown her work hours have been significantly reduced and just today she had some extra hours that she could log in.  It seems that prior to the coronavirus her bedtime would be more regular as it is currently.  She also states that she skips dinner she does not eat but if she eats she eats before 8 PM rather earlier may be as early as 6 PM.  Her bedtime now varies but used to be between 930 and 1030 but her sleep onset may be between 11 and midnight.  Her average sleep latency now with the corona crisis as well as before is over an hour.  She also reports that some nights she will wake up not knowing what will occur and she may be up for an hour.  She may go to the bathroom also she was not woken by the urge to urinate and she may go to the refrigerator also she does not feel hungry.  She may then sleep for another 4 hours until she rises usually around 630 by alarm.  She states the alarm as needed as she would not wake up  spontaneously she averages a total sleep time of 8 to 9 hours currently.  She sleeps on her right side on one pillow and family stated that she never sleeps supine.  Most nights she seems to dream but these are not nightmares and the dreams are not disturbing to her.  When she wakes up she feels initially not refreshed or restored and is rather sluggish and fatigued.  Once she has taken her shower and had her breakfast coffee she is usually ready to face the day.  Some mornings she will wake up with headaches but again headaches do not wake her up.  She has nearly always a dry mouth in the morning.   ED visit 02/17/1909:49 PM Wendy Sherell Cabler-Colmoreis a 49 y.o.femalewithout significant past medical history. She was at work about 7:45 PM and bent over. She had the sudden onset of a rapid heartbeat. She felt like her heart was racing and also skipping beats. She had no associated shortness of breath, chest pain or lightheadedness. 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 Valsalva and other vagal maneuvers were tried and her heart rate went down to 177 and she was sent here. On arrival here her heart rate was 118 and EKG showed sinus tachycardia. Her heart rate has subsequently slowed to the 90s. She has no history of arrhythmia.    REVIEW OF SYSTEMS: Out of a complete 14 system review of symptoms, the patient complains only of the following symptoms, none and all other reviewed systems are negative.   ALLERGIES: Allergies  Allergen Reactions  . Other Hives    Seafood-hives    HOME MEDICATIONS: Outpatient Medications Prior to Visit  Medication Sig Dispense Refill  . aspirin-acetaminophen-caffeine (EXCEDRIN MIGRAINE) 250-250-65 MG tablet Take 1 tablet by mouth daily as needed for headache or migraine.    Marland Kitchen buPROPion (WELLBUTRIN SR) 150 MG 12 hr tablet Take 1 tablet (150 mg total) by mouth 2 (two) times  daily. 60 tablet 2  . cetirizine (ZYRTEC) 10 MG tablet Take 1 tablet (10 mg total) by mouth daily. 90 tablet 3  . nitrofurantoin, macrocrystal-monohydrate, (MACROBID) 100 MG capsule Take 100 mg by mouth 2 (two) times daily.    . metroNIDAZOLE (FLAGYL) 500 MG tablet Take 1 tablet (500 mg total) by mouth 2 (two) times daily. 14 tablet 0   No facility-administered medications prior to visit.    PAST MEDICAL HISTORY: Past Medical History:  Diagnosis Date  . Migraine     PAST SURGICAL HISTORY: Past Surgical History:  Procedure Laterality Date  . DILATION AND EVACUATION N/A 05/02/2013   Procedure: SUCTION DILATATION AND EVACUATION;  Surgeon: Shelly Bombard, MD;  Location: [facility];  Service: Gynecology;  Laterality: N/A;  . KNEE SURGERY    . KNEE SURGERY    . KNEE SURGERY      FAMILY HISTORY: Family History  Problem Relation Age of Onset  . Hypertension Father   . Hypertension Maternal Grandmother   . Hypertension Sister     SOCIAL HISTORY: Social History   Socioeconomic History  . Marital status: Married    Spouse name: Clifton James  . Number of children: 0  . Years of education: Not on file  . Highest education level: Not on file  Occupational History  . Occupation: [employer] manager   Tobacco Use  . Smoking status: Current Every Day Smoker    Packs/day: 0.50    Types: Cigarettes  . Smokeless tobacco: Never Used  Vaping Use  . Vaping Use: Never used  Substance and Sexual Activity  . Alcohol use: Yes    Alcohol/week: 0.0 standard drinks    Comment: occaisional   . Drug use: No  . Sexual activity: Yes    Partners: Male    Birth control/protection: None    Comment: monagamous partner   Other Topics Concern  . Not on file  Social History Narrative   She is married. Lives at home with husband.    Social Determinants of Health   Financial Resource Strain:   . Difficulty of Paying Living Expenses: Not on file  Food Insecurity:   . Worried  About Charity fundraiser in the Last Year: Not on file  . Ran Out of Food in the Last Year: Not on file  Transportation Needs:   . Lack of Transportation (Medical): Not on file  . Lack of Transportation (Non-Medical): Not on file  Physical Activity:   . Days of Exercise per Week: Not on file  . Minutes of Exercise per Session: Not on file  Stress:   . Feeling of Stress : Not on file  Social Connections:   . Frequency of Communication with Friends and Family: Not on file  . Frequency of Social Gatherings with Friends and Family: Not on file  . Attends Religious Services: Not on file  . Active Member of Clubs or Organizations: Not on file  . Attends Archivist Meetings: Not on file  . Marital Status: Not on file  Intimate Partner Violence:   . Fear of Current or Ex-Partner: Not on file  . Emotionally Abused: Not on file  . Physically Abused: Not on file  . Sexually Abused: Not on file      PHYSICAL EXAM  Vitals:   11/23/19 1513  BP: 124/70  Pulse: 88  Weight: 168 lb 9.6 oz (76.5 kg)  Height: 5\' 2"  (1.575 m)   Body mass index is 30.84 kg/m.  Generalized: Well developed, in no acute distress  Cardiology: normal rate and rhythm, no murmur noted Respiratory: clear to auscultation bilaterally  Neurological examination  Mentation: Alert oriented to time, place, history taking. Follows all commands speech and language fluent Cranial nerve II-XII: Pupils were equal round reactive to light. Extraocular movements were full, visual field were full  Motor: The motor testing reveals 5 over 5 strength of all 4 extremities. Good symmetric motor tone is noted throughout.  Gait and station: Gait is normal.    DIAGNOSTIC DATA (LABS, IMAGING, TESTING) - I reviewed patient records, labs, notes, testing and imaging myself where available.  No flowsheet data found.   Lab Results  Component Value Date  WBC 7.8 09/01/2019   HGB 13.0 09/01/2019   HCT 40.6 09/01/2019   MCV  86 09/01/2019   PLT 245 09/01/2019      Component Value Date/Time   NA 137 09/01/2019 1007   K 4.3 09/01/2019 1007   CL 103 09/01/2019 1007   CO2 22 09/01/2019 1007   GLUCOSE 92 09/01/2019 1007   GLUCOSE 97 02/17/2018 2245   BUN 14 09/01/2019 1007   CREATININE 0.78 09/01/2019 1007   CALCIUM 8.8 09/01/2019 1007   PROT 6.7 09/01/2019 1007   ALBUMIN 3.9 09/01/2019 1007   AST 9 09/01/2019 1007   ALT 8 09/01/2019 1007   ALKPHOS 86 09/01/2019 1007   BILITOT 0.4 09/01/2019 1007   GFRNONAA 91 09/01/2019 1007   GFRAA 105 09/01/2019 1007   Lab Results  Component Value Date   CHOL 163 09/01/2019   HDL 32 (L) 09/01/2019   LDLCALC 108 (H) 09/01/2019   TRIG 126 09/01/2019   CHOLHDL 5.1 (H) 09/01/2019   No results found for: HGBA1C No results found for: VITAMINB12 Lab Results  Component Value Date   TSH 2.370 09/01/2019       ASSESSMENT AND PLAN 49 y.o. year old female  has a past medical history of Migraine. here with     ICD-10-CM   1. OSA on CPAP  G47.33    Z99.89     Wendy Howard is doing very well on CPAP therapy.  Compliance report reveals excellent compliance.  She was encouraged to continue using CPAP nightly and for greater than 4 hours each night.  We have addressed her concerns of increased leak.  She has replaced headgear recently and feels that this has helped.  She will continue to monitor her this at home.  She is aware to reach out to her DME company should she continue to note concerns of leak.  She will return to see me in 6 months, sooner if needed.  She verbalizes understanding and agreement with this plan.   No orders of the defined types were placed in this encounter.    No orders of the defined types were placed in this encounter.     I spent 15 minutes with the patient. 50% of this time was spent counseling and educating patient on plan of care and medications.    Debbora Presto, FNP-C 11/23/2019, 4:26 PM [facility] [address] [place], [place] [address] [phone]

## 2019-11-23 NOTE — Patient Instructions (Signed)
Please continue using your CPAP regularly. While your insurance requires that you use CPAP at least 4 hours each night on 70% of the nights, I recommend, that you not skip any nights and use it throughout the night if you can. Getting used to CPAP and staying with the treatment long term does take time and patience and discipline. Untreated obstructive sleep apnea when it is moderate to severe can have an adverse impact on cardiovascular health and raise her risk for heart disease, arrhythmias, hypertension, congestive heart failure, stroke and diabetes. Untreated obstructive sleep apnea causes sleep disruption, nonrestorative sleep, and sleep deprivation. This can have an impact on your day to day functioning and cause daytime sleepiness and impairment of cognitive function, memory loss, mood disturbance, and problems focussing. Using CPAP regularly can improve these symptoms.   You are doing great! Keep working on getting your mask seal tight.    We will see you back in 6 months     Sleep Apnea Sleep apnea affects breathing during sleep. It causes breathing to stop for a short time or to become shallow. It can also increase the risk of:  Heart attack.  Stroke.  Being very overweight (obese).  Diabetes.  Heart failure.  Irregular heartbeat. The goal of treatment is to help you breathe normally again. What are the causes? There are three kinds of sleep apnea:  Obstructive sleep apnea. This is caused by a blocked or collapsed airway.  Central sleep apnea. This happens when the brain does not send the right signals to the muscles that control breathing.  Mixed sleep apnea. This is a combination of obstructive and central sleep apnea. The most common cause of this condition is a collapsed or blocked airway. This can happen if:  Your throat muscles are too relaxed.  Your tongue and tonsils are too large.  You are overweight.  Your airway is too small. What increases the  risk?  Being overweight.  Smoking.  Having a small airway.  Being older.  Being female.  Drinking alcohol.  Taking medicines to calm yourself (sedatives or tranquilizers).  Having family members with the condition. What are the signs or symptoms?  Trouble staying asleep.  Being sleepy or tired during the day.  Getting angry a lot.  Loud snoring.  Headaches in the morning.  Not being able to focus your mind (concentrate).  Forgetting things.  Less interest in sex.  Mood swings.  Personality changes.  Feelings of sadness (depression).  Waking up a lot during the night to pee (urinate).  Dry mouth.  Sore throat. How is this diagnosed?  Your medical history.  A physical exam.  A test that is done when you are sleeping (sleep study). The test is most often done in a sleep lab but may also be done at home. How is this treated?   Sleeping on your side.  Using a medicine to get rid of mucus in your nose (decongestant).  Avoiding the use of alcohol, medicines to help you relax, or certain pain medicines (narcotics).  Losing weight, if needed.  Changing your diet.  Not smoking.  Using a machine to open your airway while you sleep, such as: ? An oral appliance. This is a mouthpiece that shifts your lower jaw forward. ? A CPAP device. This device blows air through a mask when you breathe out (exhale). ? An EPAP device. This has valves that you put in each nostril. ? A BPAP device. This device blows air through a  mask when you breathe in (inhale) and breathe out.  Having surgery if other treatments do not work. It is important to get treatment for sleep apnea. Without treatment, it can lead to:  High blood pressure.  Coronary artery disease.  In men, not being able to have an erection (impotence).  Reduced thinking ability. Follow these instructions at home: Lifestyle  Make changes that your doctor recommends.  Eat a healthy diet.  Lose  weight if needed.  Avoid alcohol, medicines to help you relax, and some pain medicines.  Do not use any products that contain nicotine or tobacco, such as cigarettes, e-cigarettes, and chewing tobacco. If you need help quitting, ask your doctor. General instructions  Take over-the-counter and prescription medicines only as told by your doctor.  If you were given a machine to use while you sleep, use it only as told by your doctor.  If you are having surgery, make sure to tell your doctor you have sleep apnea. You may need to bring your device with you.  Keep all follow-up visits as told by your doctor. This is important. Contact a doctor if:  The machine that you were given to use during sleep bothers you or does not seem to be working.  You do not get better.  You get worse. Get help right away if:  Your chest hurts.  You have trouble breathing in enough air.  You have an uncomfortable feeling in your back, arms, or stomach.  You have trouble talking.  One side of your body feels weak.  A part of your face is hanging down. These symptoms may be an emergency. Do not wait to see if the symptoms will go away. Get medical help right away. Call your local emergency services (911 in the [place] Do not drive yourself to the hospital. Summary  This condition affects breathing during sleep.  The most common cause is a collapsed or blocked airway.  The goal of treatment is to help you breathe normally while you sleep. This information is not intended to replace advice given to you by your health care provider. Make sure you discuss any questions you have with your health care provider. Document Revised: 01/01/2018 Document Reviewed: 11/10/2017 Elsevier Patient Education  [place].

## 2019-11-24 ENCOUNTER — Ambulatory Visit: Payer: BC Managed Care – PPO | Admitting: Family Medicine

## 2019-12-16 DIAGNOSIS — G4733 Obstructive sleep apnea (adult) (pediatric): Secondary | ICD-10-CM | POA: Diagnosis not present

## 2020-03-29 ENCOUNTER — Encounter: Payer: Self-pay | Admitting: Family Medicine

## 2020-03-29 ENCOUNTER — Other Ambulatory Visit: Payer: Self-pay

## 2020-03-29 ENCOUNTER — Telehealth (INDEPENDENT_AMBULATORY_CARE_PROVIDER_SITE_OTHER): Payer: 59 | Admitting: Family Medicine

## 2020-03-29 ENCOUNTER — Telehealth: Payer: BC Managed Care – PPO | Admitting: Emergency Medicine

## 2020-03-29 ENCOUNTER — Encounter: Payer: 59 | Admitting: Family Medicine

## 2020-03-29 VITALS — Ht 62.0 in | Wt 172.0 lb

## 2020-03-29 DIAGNOSIS — R059 Cough, unspecified: Secondary | ICD-10-CM

## 2020-03-29 DIAGNOSIS — R509 Fever, unspecified: Secondary | ICD-10-CM | POA: Diagnosis not present

## 2020-03-29 DIAGNOSIS — K5900 Constipation, unspecified: Secondary | ICD-10-CM | POA: Diagnosis not present

## 2020-03-29 DIAGNOSIS — M791 Myalgia, unspecified site: Secondary | ICD-10-CM | POA: Diagnosis not present

## 2020-03-29 NOTE — Progress Notes (Signed)
Virtual Visit via Telephone Note  I connected with Wendy Howard on 03/29/20 at 5:12 PM by telephone and verified that I am speaking with the correct person using two identifiers. Patient location: work - in private area.  My location: home   I discussed the limitations, risks, security and privacy concerns of performing an evaluation and management service by telephone and the availability of in person appointments. I also discussed with the patient that there may be a patient responsible charge related to this service. The patient expressed understanding and agreed to proceed, consent obtained  Chief complaint:  Chief Complaint  Patient presents with  . URI     RECENT NEGATIVE COVID TEST - COUGHING, CHILLS, FEVER, NO APPETITE.Pt reports constipation. Pt states she has had a BM only every 2 days.   History of Present Illness:  Wendy Howard is a 49 y.o. female  Started withchills, fever, decreased appetite starting 3 days ago. Hot and cold spells, fatigue.min cough Monday night 102.6, 101.2 on Tuesday night, 100.2 last night. Ibuprofen for fever. Fever at night only.  Overall feels better today.  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  Had pfizer covid vaccine in March and April. Has not had booster yet. Had flu vaccine in September.  Had negative rapid covid test/antigen test on 12/28 - day after symptoms started.  No dysuria, urgency or new urinary symptoms, or abdominal pain.  Some constipation - has to take otc meds at times. Some side pain when constipated earlier in the week. Had BM today. Doing better.     Patient Active Problem List   Diagnosis Date Noted  . Severe obstructive sleep apnea-hypopnea syndrome 08/25/2019  . Chronic intermittent hypoxia with obstructive sleep apnea 08/25/2019  . Paroxysmal supraventricular tachycardia (HCC) 09/08/2018  .  Loud snoring 09/08/2018  . Other fatigue 09/08/2018  . Narrow pharyngeal airway 09/08/2018  . Morbidly obese (HCC) 09/08/2018  . Smoker within last 12 months 09/08/2018  . BV (bacterial vaginosis) 07/02/2013  . Missed abortion 05/16/2013  . IUFD (intrauterine fetal death) 04-30-2013  . Pregnancy with inconclusive fetal viability 04/11/2013  . Candidiasis of vulva and vagina 04/11/2013  . Supervision of high-risk pregnancy of elderly primigravida 03/14/2013   Past Medical History:  Diagnosis Date  . Migraine    Past Surgical History:  Procedure Laterality Date  . DILATION AND EVACUATION N/A 05/02/2013   Procedure: SUCTION DILATATION AND EVACUATION;  Surgeon: Brock Bad, MD;  Location: [facility];  Service: Gynecology;  Laterality: N/A;  . KNEE SURGERY    . KNEE SURGERY    . KNEE SURGERY     Allergies  Allergen Reactions  . Other Hives    Seafood-hives   Prior to Admission medications   Medication Sig Start Date End Date Taking? Authorizing Provider  aspirin-acetaminophen-caffeine (EXCEDRIN MIGRAINE) [phone] MG tablet Take 1 tablet by mouth daily as needed for headache or migraine.   Yes [provider]  buPROPion (WELLBUTRIN SR) 150 MG 12 hr tablet Take 1 tablet (150 mg total) by mouth 2 (two) times daily. 09/01/19  Yes Lezlie Lye, Meda Coffee, MD  cetirizine (ZYRTEC) 10 MG tablet Take 1 tablet (10 mg total) by mouth daily. 09/01/19  Yes Lezlie Lye, Meda Coffee, MD  nitrofurantoin, macrocrystal-monohydrate, (MACROBID) 100 MG capsule Take 100 mg by mouth 2 (two) times daily. 11/17/19  Yes [provider]   Social  History   Socioeconomic History  . Marital status: Married    Spouse name: Clifton James  . Number of children: 0  . Years of education: Not on file  . Highest education level: Not on file  Occupational History  . Occupation: [employer] manager   Tobacco Use  . Smoking status: Former Smoker    Packs/day: 0.50    Types: Cigarettes     Quit date: 03/08/2020    Years since quitting: 0.0  . Smokeless tobacco: Never Used  Vaping Use  . Vaping Use: Never used  Substance and Sexual Activity  . Alcohol use: Yes    Alcohol/week: 0.0 standard drinks    Comment: occaisional   . Drug use: No  . Sexual activity: Yes    Partners: Male    Birth control/protection: None    Comment: monagamous partner   Other Topics Concern  . Not on file  Social History Narrative   She is married. Lives at home with husband.    Social Determinants of Health   Financial Resource Strain: Not on file  Food Insecurity: Not on file  Transportation Needs: Not on file  Physical Activity: Not on file  Stress: Not on file  Social Connections: Not on file  Intimate Partner Violence: Not on file   Ros per HPI.   Observations/Objective: Vitals:   03/29/20 1155  Weight: 172 lb (78 kg)  Height: 5\' 2"  (1.575 m)  Speaking in full/complete sentences, no apparent respiratory distress over the phone.  Appropriate responses.  All questions were answered with understanding of plan expressed.   Assessment and Plan: Cough  Fever, unspecified  Myalgia Onset of symptoms December 27.  Suspicious for viral illness, and differential still includes breakthrough COVID-19 infection with previous vaccination.  Negative antigen test on December 28, but symptoms have been present from day before only. Potentially false negative test with early symptoms.  Recommend repeat testing in the next day or 2, isolation until negative test.  Advised her to discuss with her employer.  Symptomatic care discussed with RTC/urgent care/ER precautions.  Also discussed constipation prevention and treatment with MiraLAX if needed with RTC precautions.  Follow Up Instructions: ER/urgent care precautions given.   I discussed the assessment and treatment plan with the patient. The patient was provided an opportunity to ask questions and all were answered. The patient agreed with the  plan and demonstrated an understanding of the instructions.   The patient was advised to call back or seek an in-person evaluation if the symptoms worsen or if the condition fails to improve as anticipated.  I provided 17 minutes of non-face-to-face time during this encounter.  Signed,   Merri Ray, MD [facility].  03/29/20

## 2020-03-29 NOTE — Patient Instructions (Signed)
° ° ° °  If you have lab work done today you will be contacted with your lab results within the next 2 weeks.  If you have not heard from us then please contact us. The fastest way to get your results is to register for My Chart. ° ° °IF you received an x-ray today, you will receive an invoice from [facility]. Please contact [facility] at [phone] with questions or concerns regarding your invoice.  ° °IF you received labwork today, you will receive an invoice from [facility]. Please contact [facility] at [phone] with questions or concerns regarding your invoice.  ° °Our billing staff will not be able to assist you with questions regarding bills from these companies. ° °You will be contacted with the lab results as soon as they are available. The fastest way to get your results is to activate your My Chart account. Instructions are located on the last page of this paperwork. If you have not heard from us regarding the results in 2 weeks, please contact this office. °  ° ° ° °

## 2020-03-29 NOTE — Patient Instructions (Signed)
° ° ° °  If you have lab work done today you will be contacted with your lab results within the next 2 weeks.  If you have not heard from us then please contact us. The fastest way to get your results is to register for My Chart. ° ° °IF you received an x-ray today, you will receive an invoice from [facility]. Please contact [facility] at [phone] with questions or concerns regarding your invoice.  ° °IF you received labwork today, you will receive an invoice from [facility]. Please contact [facility] at [phone] with questions or concerns regarding your invoice.  ° °Our billing staff will not be able to assist you with questions regarding bills from these companies. ° °You will be contacted with the lab results as soon as they are available. The fastest way to get your results is to activate your My Chart account. Instructions are located on the last page of this paperwork. If you have not heard from us regarding the results in 2 weeks, please contact this office. °  ° ° ° °

## 2020-04-02 ENCOUNTER — Telehealth: Payer: 59 | Admitting: Family Medicine

## 2020-05-28 ENCOUNTER — Ambulatory Visit: Payer: BC Managed Care – PPO | Admitting: Family Medicine

## 2020-06-07 ENCOUNTER — Encounter: Payer: Self-pay | Admitting: Obstetrics

## 2020-06-07 ENCOUNTER — Ambulatory Visit (INDEPENDENT_AMBULATORY_CARE_PROVIDER_SITE_OTHER): Payer: PRIVATE HEALTH INSURANCE | Admitting: Obstetrics

## 2020-06-07 ENCOUNTER — Other Ambulatory Visit: Payer: Self-pay

## 2020-06-07 ENCOUNTER — Other Ambulatory Visit (HOSPITAL_COMMUNITY)
Admission: RE | Admit: 2020-06-07 | Discharge: 2020-06-07 | Disposition: A | Discharge status: Home / Self Care | Payer: PRIVATE HEALTH INSURANCE | Source: Ambulatory Visit | Attending: Obstetrics | Admitting: Obstetrics

## 2020-06-07 VITALS — BP 136/89 | HR 86 | Ht 62.0 in | Wt 184.0 lb

## 2020-06-07 DIAGNOSIS — N760 Acute vaginitis: Secondary | ICD-10-CM

## 2020-06-07 DIAGNOSIS — B9689 Other specified bacterial agents as the cause of diseases classified elsewhere: Secondary | ICD-10-CM

## 2020-06-07 DIAGNOSIS — E669 Obesity, unspecified: Secondary | ICD-10-CM | POA: Diagnosis not present

## 2020-06-07 DIAGNOSIS — N898 Other specified noninflammatory disorders of vagina: Secondary | ICD-10-CM | POA: Insufficient documentation

## 2020-06-07 DIAGNOSIS — F17200 Nicotine dependence, unspecified, uncomplicated: Secondary | ICD-10-CM

## 2020-06-07 DIAGNOSIS — N951 Menopausal and female climacteric states: Secondary | ICD-10-CM

## 2020-06-07 DIAGNOSIS — E66811 Obesity, class 1: Secondary | ICD-10-CM

## 2020-06-07 DIAGNOSIS — N926 Irregular menstruation, unspecified: Secondary | ICD-10-CM | POA: Diagnosis not present

## 2020-06-07 MED ORDER — METRONIDAZOLE 500 MG PO TABS: 500.0000 mg | ORAL_TABLET | Freq: Two times a day (BID) | ORAL | 2 refills | Status: DC

## 2020-06-07 NOTE — Progress Notes (Signed)
[facility] presents for problem visit.  LMP: 05/30/20 lasted 5 days usually last 3 days   CC: pt did not have cycle in Jan 2022. Had cycle 12/25-12-27-21 then 05/12/20.  Last AEX 10/31/2019 /Pap WNL

## 2020-06-07 NOTE — Progress Notes (Signed)
Patient ID: Wendy Howard, female   DOB: May 17, 1970, 50 y.o.   MRN: [id number]  Chief Complaint  Patient presents with  . Menstrual Problem    HPI Wendy Howard is a 50 y.o. female.  Complains of no period in January.  Periods have been regular 3 day periods up until January.  Has had no intermenstrual spotting or bleeding.  Also has vaginal discharge. HPI  Past Medical History:  Diagnosis Date  . Migraine     Past Surgical History:  Procedure Laterality Date  . DILATION AND EVACUATION N/A 05/02/2013   Procedure: SUCTION DILATATION AND EVACUATION;  Surgeon: Shelly Bombard, MD;  Location: [facility];  Service: Gynecology;  Laterality: N/A;  . KNEE SURGERY    . KNEE SURGERY    . KNEE SURGERY      Family History  Problem Relation Age of Onset  . Hypertension Father   . Hypertension Maternal Grandmother   . Hypertension Sister     Social History Social History   Tobacco Use  . Smoking status: Former Smoker    Packs/day: 0.50    Types: Cigarettes    Quit date: 03/08/2020    Years since quitting: 0.2  . Smokeless tobacco: Never Used  Vaping Use  . Vaping Use: Never used  Substance Use Topics  . Alcohol use: Yes    Alcohol/week: 0.0 standard drinks    Comment: occaisional   . Drug use: No    Allergies  Allergen Reactions  . Other Hives    Seafood-hives    Current Outpatient Medications  Medication Sig Dispense Refill  . aspirin-acetaminophen-caffeine (EXCEDRIN MIGRAINE) 250-250-65 MG tablet Take 1 tablet by mouth daily as needed for headache or migraine.    Marland Kitchen buPROPion (WELLBUTRIN SR) 150 MG 12 hr tablet Take 1 tablet (150 mg total) by mouth 2 (two) times daily. 60 tablet 2  . cetirizine (ZYRTEC) 10 MG tablet Take 1 tablet (10 mg total) by mouth daily. 90 tablet 3  . metroNIDAZOLE (FLAGYL) 500 MG tablet Take 1 tablet (500 mg total) by mouth 2 (two) times daily. 14 tablet 2  . nitrofurantoin, macrocrystal-monohydrate, (MACROBID) 100 MG  capsule Take 100 mg by mouth 2 (two) times daily. (Patient not taking: Reported on 06/07/2020)     No current facility-administered medications for this visit.    Review of Systems Review of Systems Constitutional: negative for fatigue and weight loss Respiratory: negative for cough and wheezing Cardiovascular: negative for chest pain, fatigue and palpitations Gastrointestinal: negative for abdominal pain and change in bowel habits Genitourinary: positive for missed period and vaginal discharge Integument/breast: negative for nipple discharge Musculoskeletal:negative for myalgias Neurological: negative for gait problems and tremors Behavioral/Psych: negative for abusive relationship, depression Endocrine: negative for temperature intolerance      Blood pressure 136/89, pulse 86, height 5\' 2"  (1.575 m), weight 184 lb (83.5 kg).  Physical Exam Physical Exam General:   alert and no distress  Skin:   no rash or abnormalities  Lungs:   clear to auscultation bilaterally  Heart:   regular rate and rhythm, S1, S2 normal, no murmur, click, rub or gallop  Breasts:   not examined  Abdomen:  normal findings: no organomegaly, soft, non-tender and no hernia  Pelvis:  External genitalia: normal general appearance Urinary system: urethral meatus normal and bladder without fullness, nontender Vaginal: normal without tenderness, induration or masses Cervix: normal appearance Adnexa: normal bimanual exam Uterus: anteverted and non-tender, normal size    50% of 15 min  visit spent on counseling and coordination of care.   Data Reviewed Wet Prep and Cultures  Assessment     1. Irregular periods/menstrual cycles Rx: - US PELVIC COMPLETE WITH TRANSVAGINAL; Future  2. Vaginal discharge Rx: - Cervicovaginal ancillary only  3. BV (bacterial vaginosis) Rx: - metroNIDAZOLE (FLAGYL) 500 MG tablet; Take 1 tablet (500 mg total) by mouth 2 (two) times daily.  Dispense: 14 tablet; Refill: 2  4.  Obesity (BMI 30.0-34.9) - program of caloric reduction, exercise and behavioral modification recommended  5. Perimenopause  6. Tobacco dependence - cessation with the aid of medication and behavioral modification recommended     Plan  Follow up after ultrasound virtually with MyChart   Orders Placed This Encounter  Procedures  . US PELVIC COMPLETE WITH TRANSVAGINAL    Standing Status:   Future    Standing Expiration Date:   06/07/2021    Order Specific Question:   Reason for Exam (SYMPTOM  OR DIAGNOSIS REQUIRED)    Answer:   Irregular periods.    Order Specific Question:   Preferred imaging location?    Answer:   [facility] ordered this encounter  Medications  . metroNIDAZOLE (FLAGYL) 500 MG tablet    Sig: Take 1 tablet (500 mg total) by mouth 2 (two) times daily.    Dispense:  14 tablet    Refill:  2     Shelly Bombard, MD 06/07/2020 4:52 PM

## 2020-06-10 LAB — CERVICOVAGINAL ANCILLARY ONLY
Bacterial Vaginitis (gardnerella): POSITIVE — AB
Candida Glabrata: NEGATIVE
Candida Vaginitis: NEGATIVE
Chlamydia: NEGATIVE
Comment: NEGATIVE
Comment: NEGATIVE
Comment: NEGATIVE
Comment: NEGATIVE
Comment: NEGATIVE
Comment: NORMAL
Neisseria Gonorrhea: NEGATIVE
Trichomonas: NEGATIVE

## 2020-06-12 ENCOUNTER — Other Ambulatory Visit: Payer: Self-pay | Admitting: Obstetrics

## 2020-06-15 ENCOUNTER — Other Ambulatory Visit: Payer: Self-pay

## 2020-06-15 MED ORDER — METRONIDAZOLE 0.75 % VA GEL: 1.0000 | Freq: Every day | VAGINAL | 0 refills | Status: DC

## 2020-06-15 NOTE — Progress Notes (Signed)
Pt requests gel for BV.  She can not tolerate the pills.  Metrogel sent to the pharmacy per protocol

## 2020-06-21 ENCOUNTER — Ambulatory Visit
Admission: RE | Admit: 2020-06-21 | Discharge: 2020-06-21 | Disposition: A | Discharge status: Home / Self Care | Payer: PRIVATE HEALTH INSURANCE | Source: Ambulatory Visit | Attending: Obstetrics | Admitting: Obstetrics

## 2020-06-21 ENCOUNTER — Other Ambulatory Visit: Payer: Self-pay

## 2020-06-21 DIAGNOSIS — N926 Irregular menstruation, unspecified: Secondary | ICD-10-CM | POA: Insufficient documentation

## 2020-06-28 ENCOUNTER — Telehealth (INDEPENDENT_AMBULATORY_CARE_PROVIDER_SITE_OTHER): Payer: PRIVATE HEALTH INSURANCE | Admitting: Obstetrics

## 2020-06-28 DIAGNOSIS — N760 Acute vaginitis: Secondary | ICD-10-CM | POA: Diagnosis not present

## 2020-06-28 DIAGNOSIS — N839 Noninflammatory disorder of ovary, fallopian tube and broad ligament, unspecified: Secondary | ICD-10-CM

## 2020-06-28 DIAGNOSIS — E669 Obesity, unspecified: Secondary | ICD-10-CM | POA: Diagnosis not present

## 2020-06-28 DIAGNOSIS — F17200 Nicotine dependence, unspecified, uncomplicated: Secondary | ICD-10-CM

## 2020-06-28 DIAGNOSIS — N926 Irregular menstruation, unspecified: Secondary | ICD-10-CM | POA: Diagnosis not present

## 2020-06-28 DIAGNOSIS — B9689 Other specified bacterial agents as the cause of diseases classified elsewhere: Secondary | ICD-10-CM

## 2020-06-28 MED ORDER — TINIDAZOLE 500 MG PO TABS: 1000.0000 mg | ORAL_TABLET | Freq: Every day | ORAL | 2 refills | Status: DC

## 2020-06-28 NOTE — Progress Notes (Signed)
GYNECOLOGY VIRTUAL VISIT ENCOUNTER NOTE  Provider location: [facility] [place] [facility]   Patient location: Home  I connected with Wendy Howard on 06/28/20 at  4:15 PM EDT by MyChart Video Encounter and verified that I am speaking with the correct person using two identifiers.   I discussed the limitations, risks, security and privacy concerns of performing an evaluation and management service virtually and the availability of in person appointments. I also discussed with the patient that there may be a patient responsible charge related to this service. The patient expressed understanding and agreed to proceed.   History:  Wendy Howard is a 50 y.o. G2P0010 female being evaluated today for irregular periods, and no period yet in March.  She denies any abnormal vaginal discharge, bleeding, pelvic pain or other concerns.       Past Medical History:  Diagnosis Date  . Migraine    Past Surgical History:  Procedure Laterality Date  . DILATION AND EVACUATION N/A 05/02/2013   Procedure: SUCTION DILATATION AND EVACUATION;  Surgeon: Shelly Bombard, MD;  Location: [facility];  Service: Gynecology;  Laterality: N/A;  . KNEE SURGERY    . KNEE SURGERY    . KNEE SURGERY     The following portions of the patient's history were reviewed and updated as appropriate: allergies, current medications, past family history, past medical history, past social history, past surgical history and problem list.   Health Maintenance:  Normal pap and negative HRHPV on 10-31-2019.  Normal mammogram on 09-27-2019.   Review of Systems:  Pertinent items noted in HPI and remainder of comprehensive ROS otherwise negative.  Physical Exam:   General:  Alert, oriented and cooperative. Patient appears to be in no acute distress.  Mental Status: Normal mood and affect. Normal behavior. Normal judgment and thought content.   Respiratory: Normal respiratory effort, no problems  with respiration noted  Rest of physical exam deferred due to type of encounter  Labs and Imaging No results found for this or any previous visit (from the past 336 hour(s)). US PELVIC COMPLETE WITH TRANSVAGINAL  Result Date: 06/21/2020 CLINICAL DATA:  Irregular menses, LMP 05/30/2020 EXAM: TRANSABDOMINAL AND TRANSVAGINAL ULTRASOUND OF PELVIS TECHNIQUE: Both transabdominal and transvaginal ultrasound examinations of the pelvis were performed. Transabdominal technique was performed for global imaging of the pelvis including uterus, ovaries, adnexal regions, and pelvic cul-de-sac. It was necessary to proceed with endovaginal exam following the transabdominal exam to visualize the endometrium. COMPARISON:  None FINDINGS: Uterus Measurements: 6.8 x 3.2 x 4.1 cm = volume: 47 mL. Anteverted and anteflexed. Heterogeneous myometrium. Small exophytic leiomyoma at anterior upper uterus 11 x 8 x 6 mm. No additional focal masses. Endometrium Thickness: 7 mm.  No endometrial fluid or focal abnormality Right ovary Measurements: 3.1 x 1.9 x 2.2 cm = volume: 6.9 mL. Normal morphology without mass Left ovary Measurements: 2.8 x 1.4 x 1.6 cm = volume: 3.4 mL. Normal morphology without mass Other findings Trace free pelvic fluid.  No adnexal masses. IMPRESSION: Small exophytic leiomyoma at anterior upper uterus 11 mm diameter. Otherwise normal exam. Electronically Signed   By: [name] Dana M.D.   On: 06/21/2020 16:20       Assessment and Plan:         1. Irregular periods/menstrual cycles - probable anovulatory cycles  2. Disorder of ovulation - needs OCP regulation of cycles  3. Obesity (BMI 30.0-34.9) - program of caloric reduction, exercise and behavioral modification recommended  4.  BV (bacterial vaginosis) Rx: - tinidazole (TINDAMAX) 500 MG tablet; Take 2 tablets (1,000 mg total) by mouth daily with breakfast.  Dispense: 10 tablet; Refill: 2  5. Tobacco dependence - cessation with the aid of medication  and behavioral modification recommended    I discussed the assessment and treatment plan with the patient. The patient was provided an opportunity to ask questions and all were answered. The patient agreed with the plan and demonstrated an understanding of the instructions.   The patient was advised to call back or seek an in-person evaluation/go to the ED if the symptoms worsen or if the condition fails to improve as anticipated.  I provided 15 minutes of face-to-face time during this encounter.   Baltazar Najjar, MD [facility] 06/28/20

## 2020-07-23 ENCOUNTER — Encounter: Payer: Self-pay | Admitting: Emergency Medicine

## 2020-07-23 ENCOUNTER — Ambulatory Visit (INDEPENDENT_AMBULATORY_CARE_PROVIDER_SITE_OTHER): Payer: PRIVATE HEALTH INSURANCE | Admitting: Emergency Medicine

## 2020-07-23 ENCOUNTER — Other Ambulatory Visit: Payer: Self-pay

## 2020-07-23 VITALS — BP 116/82 | HR 90 | Temp 98.5°F | Ht 62.0 in | Wt 183.6 lb

## 2020-07-23 DIAGNOSIS — I471 Supraventricular tachycardia, unspecified: Secondary | ICD-10-CM

## 2020-07-23 DIAGNOSIS — F17200 Nicotine dependence, unspecified, uncomplicated: Secondary | ICD-10-CM | POA: Diagnosis not present

## 2020-07-23 DIAGNOSIS — Z1211 Encounter for screening for malignant neoplasm of colon: Secondary | ICD-10-CM

## 2020-07-23 DIAGNOSIS — Z8669 Personal history of other diseases of the nervous system and sense organs: Secondary | ICD-10-CM

## 2020-07-23 DIAGNOSIS — G4733 Obstructive sleep apnea (adult) (pediatric): Secondary | ICD-10-CM | POA: Diagnosis not present

## 2020-07-23 DIAGNOSIS — Z716 Tobacco abuse counseling: Secondary | ICD-10-CM

## 2020-07-23 DIAGNOSIS — Z7689 Persons encountering health services in other specified circumstances: Secondary | ICD-10-CM | POA: Diagnosis not present

## 2020-07-23 DIAGNOSIS — Z9989 Dependence on other enabling machines and devices: Secondary | ICD-10-CM

## 2020-07-23 LAB — COMPREHENSIVE METABOLIC PANEL
ALT: 9 U/L (ref 0–35)
AST: 11 U/L (ref 0–37)
Albumin: 3.6 g/dL (ref 3.5–5.2)
Alkaline Phosphatase: 82 U/L (ref 39–117)
BUN: 13 mg/dL (ref 6–23)
CO2: 27 mEq/L (ref 19–32)
Calcium: 8.7 mg/dL (ref 8.4–10.5)
Chloride: 105 mEq/L (ref 96–112)
Creatinine, Ser: 0.79 mg/dL (ref 0.40–1.20)
GFR: 88.92 mL/min (ref >60.00)
Glucose, Bld: 86 mg/dL (ref 70–99)
Potassium: 3.9 mEq/L (ref 3.5–5.1)
Sodium: 138 mEq/L (ref 135–145)
Total Bilirubin: 0.4 mg/dL (ref 0.2–1.2)
Total Protein: 7 g/dL (ref 6.0–8.3)

## 2020-07-23 LAB — LIPID PANEL
Cholesterol: 160 mg/dL (ref 0–200)
HDL: 34.4 mg/dL — ABNORMAL LOW (ref >39.00)
LDL Cholesterol: 100 mg/dL — ABNORMAL HIGH (ref 0–99)
NonHDL: 126.08
Total CHOL/HDL Ratio: 5
Triglycerides: 129 mg/dL (ref 0.0–149.0)
VLDL: 25.8 mg/dL (ref 0.0–40.0)

## 2020-07-23 LAB — CBC WITH DIFFERENTIAL/PLATELET
Basophils Absolute: 0 10*3/uL (ref 0.0–0.1)
Basophils Relative: 0.5 % (ref 0.0–3.0)
Eosinophils Absolute: 0.2 10*3/uL (ref 0.0–0.7)
Eosinophils Relative: 2.8 % (ref 0.0–5.0)
HCT: 39.6 % (ref 36.0–46.0)
Hemoglobin: 12.8 g/dL (ref 12.0–15.0)
Lymphocytes Relative: 29.4 % (ref 12.0–46.0)
Lymphs Abs: 2.2 10*3/uL (ref 0.7–4.0)
MCHC: 32.4 g/dL (ref 30.0–36.0)
MCV: 85.6 fl (ref 78.0–100.0)
Monocytes Absolute: 0.5 10*3/uL (ref 0.1–1.0)
Monocytes Relative: 7.3 % (ref 3.0–12.0)
Neutro Abs: 4.4 10*3/uL (ref 1.4–7.7)
Neutrophils Relative %: 60 % (ref 43.0–77.0)
Platelets: 239 10*3/uL (ref 150.0–400.0)
RBC: 4.62 Mil/uL (ref 3.87–5.11)
RDW: 13.9 % (ref 11.5–15.5)
WBC: 7.3 10*3/uL (ref 4.0–10.5)

## 2020-07-23 MED ORDER — BUPROPION HCL ER (SR) 150 MG PO TB12: 150.0000 mg | ORAL_TABLET | Freq: Two times a day (BID) | ORAL | 1 refills | Status: DC

## 2020-07-23 MED ORDER — CETIRIZINE HCL 10 MG PO TABS: 10.0000 mg | ORAL_TABLET | Freq: Every day | ORAL | 3 refills | Status: DC

## 2020-07-23 MED ORDER — MELOXICAM 15 MG PO TABS: 15.0000 mg | ORAL_TABLET | Freq: Every day | ORAL | 0 refills | Status: DC | PRN

## 2020-07-23 NOTE — Assessment & Plan Note (Signed)
Well-controlled.  No recent events.  No concerns.

## 2020-07-23 NOTE — Assessment & Plan Note (Signed)
Takes Excedrin Migraine occasionally but not working as well anymore.  Requesting no medication.  We will try meloxicam 15 mg as needed.

## 2020-07-23 NOTE — Progress Notes (Signed)
Wendy Howard 50 y.o.   Chief Complaint  Patient presents with  . Transitions Of Care    Former Dr Pamella Pert  . Medication Refill    Zyrtec    HISTORY OF PRESENT ILLNESS: This is a 50 y.o. female former patient of Dr. Pamella Pert, here to establish care with me. #1 history of OSA on CPAP.  Doing well.  Sleeping much better. #2 history of recurrent migraine headaches.  Sometimes Excedrin migraine helps.  Requesting something different. #3 history of paroxysmal SVTs.  No recent episodes. #4 current smoker 1 pack every 2 days.  Wants to get back on Wellbutrin.  HPI   Prior to Admission medications   Medication Sig Start Date End Date Taking? Authorizing Provider  aspirin-acetaminophen-caffeine (EXCEDRIN MIGRAINE) [phone] MG tablet Take 1 tablet by mouth daily as needed for headache or migraine.   Yes [provider]  cetirizine (ZYRTEC) 10 MG tablet Take 1 tablet (10 mg total) by mouth daily. 09/01/19  Yes Jacelyn Pi, Lilia Argue, MD  tinidazole [facility]) 500 MG tablet Take 2 tablets (1,000 mg total) by mouth daily with breakfast. 06/28/20  Yes Shelly Bombard, MD  buPROPion [facility] SR) 150 MG 12 hr tablet Take 1 tablet (150 mg total) by mouth 2 (two) times daily. Patient not taking: Reported on 07/23/2020 09/01/19   Jacelyn Pi, Lilia Argue, MD  nitrofurantoin, macrocrystal-monohydrate, (MACROBID) 100 MG capsule Take 100 mg by mouth 2 (two) times daily. Patient not taking: Reported on 06/07/2020 11/17/19   [provider]    Allergies  Allergen Reactions  . Other Hives    Seafood-hives    Patient Active Problem List   Diagnosis Date Noted  . Severe obstructive sleep apnea-hypopnea syndrome 08/25/2019  . Chronic intermittent hypoxia with obstructive sleep apnea 08/25/2019  . Paroxysmal supraventricular tachycardia ([place]) 09/08/2018  . Loud snoring 09/08/2018  . Other fatigue 09/08/2018  . Narrow pharyngeal airway 09/08/2018  . Morbidly obese ([place])  09/08/2018  . Smoker within last 12 months 09/08/2018  . BV (bacterial vaginosis) 07/02/2013  . Missed abortion 05/16/2013  . IUFD (intrauterine fetal death) 2013-05-03  . Pregnancy with inconclusive fetal viability 04/11/2013  . Candidiasis of vulva and vagina 04/11/2013  . Supervision of high-risk pregnancy of elderly primigravida 03/14/2013    Past Medical History:  Diagnosis Date  . Migraine     Past Surgical History:  Procedure Laterality Date  . DILATION AND EVACUATION N/A 05/02/2013   Procedure: SUCTION DILATATION AND EVACUATION;  Surgeon: Shelly Bombard, MD;  Location: [facility];  Service: Gynecology;  Laterality: N/A;  . KNEE SURGERY    . KNEE SURGERY    . KNEE SURGERY      Social History   Socioeconomic History  . Marital status: Married    Spouse name: Clifton James  . Number of children: 0  . Years of education: Not on file  . Highest education level: Not on file  Occupational History  . Occupation: Heritage manager   Tobacco Use  . Smoking status: Former Smoker    Packs/day: 0.50    Types: Cigarettes    Quit date: 03/08/2020    Years since quitting: 0.3  . Smokeless tobacco: Never Used  Vaping Use  . Vaping Use: Never used  Substance and Sexual Activity  . Alcohol use: Yes    Alcohol/week: 0.0 standard drinks    Comment: occaisional   . Drug use: No  . Sexual activity: Yes    Partners: Male  Birth control/protection: None    Comment: monagamous partner   Other Topics Concern  . Not on file  Social History Narrative   She is married. Lives at home with husband.    Social Determinants of Health   Financial Resource Strain: Not on file  Food Insecurity: Not on file  Transportation Needs: Not on file  Physical Activity: Not on file  Stress: Not on file  Social Connections: Not on file  Intimate Partner Violence: Not on file    Family History  Problem Relation Age of Onset  . Hypertension Father   . Hypertension Maternal  Grandmother   . Hypertension Sister      Review of Systems  Constitutional: Negative.  Negative for chills and fever.  HENT: Negative.  Negative for congestion and sore throat.   Respiratory: Negative.  Negative for cough and shortness of breath.   Cardiovascular: Negative.  Negative for chest pain and palpitations.  Gastrointestinal: Negative.  Negative for abdominal pain, diarrhea, nausea and vomiting.  Genitourinary: Negative.  Negative for dysuria and hematuria.  Skin: Negative.  Negative for rash.  Neurological: Negative.  Negative for dizziness and headaches.  All other systems reviewed and are negative.   Today's Vitals   07/23/20 1022  BP: 116/82  Pulse: 90  Temp: 98.5 F (36.9 C)  TempSrc: Oral  SpO2: 98%  Weight: 183 lb 9.6 oz (83.3 kg)  Height: 5\' 2"  (1.575 m)   Body mass index is 33.58 kg/m. Wt Readings from Last 3 Encounters:  07/23/20 183 lb 9.6 oz (83.3 kg)  06/07/20 184 lb (83.5 kg)  03/29/20 172 lb (78 kg)    Physical Exam Vitals reviewed.  Constitutional:      Appearance: Normal appearance.  HENT:     Head: Normocephalic.  Eyes:     Extraocular Movements: Extraocular movements intact.     Conjunctiva/sclera: Conjunctivae normal.     Pupils: Pupils are equal, round, and reactive to light.  Cardiovascular:     Rate and Rhythm: Normal rate and regular rhythm.     Pulses: Normal pulses.     Heart sounds: Normal heart sounds.  Pulmonary:     Effort: Pulmonary effort is normal.     Breath sounds: Normal breath sounds.  Musculoskeletal:        General: Normal range of motion.     Cervical back: Normal range of motion and neck supple.  Skin:    General: Skin is warm and dry.     Capillary Refill: Capillary refill takes less than 2 seconds.  Neurological:     General: No focal deficit present.     Mental Status: She is alert and oriented to person, place, and time.  Psychiatric:        Mood and Affect: Mood normal.        Behavior: Behavior  normal.    A total of 30 minutes was spent with the patient and counseling/coordination of care regarding establishing care with me, health maintenance items, smoking cessation advice and cardiovascular risks associated with smoking, risk factor modification, education on nutrition, review of most recent office visit notes, review of most recent blood work results, prognosis, documentation, and need for follow-up.   ASSESSMENT & PLAN: Paroxysmal supraventricular tachycardia ([place]) Well-controlled.  No recent events.  No concerns.  Severe obstructive sleep apnea-hypopnea syndrome Well-controlled on CPAP treatment.  Current smoker Smoking cessation advice given.  Smoking complications including cancer, COPD and cardiovascular disease discussed with patient. Wants to start Wellbutrin 150  mg twice a day.  History of migraine Takes Excedrin Migraine occasionally but not working as well anymore.  Requesting no medication.  We will try meloxicam 15 mg as needed.  Manasa was seen today for transitions of care and medication refill.  Diagnoses and all orders for this visit:  Current smoker -     buPROPion (WELLBUTRIN SR) 150 MG 12 hr tablet; Take 1 tablet (150 mg total) by mouth 2 (two) times daily. -     Comprehensive metabolic panel -     CBC with Differential/Platelet -     Lipid panel  Paroxysmal supraventricular tachycardia (HCC)  OSA on CPAP  Encounter to establish care  History of migraine -     meloxicam (MOBIC) 15 MG tablet; Take 1 tablet (15 mg total) by mouth daily as needed for pain. Take as needed for headaches  Colon cancer screening -     Ambulatory referral to Gastroenterology  Severe obstructive sleep apnea-hypopnea syndrome  Encounter for smoking cessation counseling  Other orders -     cetirizine (ZYRTEC) 10 MG tablet; Take 1 tablet (10 mg total) by mouth daily.    Patient Instructions   Steps to Quit Smoking Smoking tobacco is the leading cause of  preventable death. It can affect almost every organ in the body. Smoking puts you and people around you at risk for many serious, long-lasting (chronic) diseases. Quitting smoking can be hard, but it is one of the best things that you can do for your health. It is never too late to quit. How do I get ready to quit? When you decide to quit smoking, make a plan to help you succeed. Before you quit:  Pick a date to quit. Set a date within the next 2 weeks to give you time to prepare.  Write down the reasons why you are quitting. Keep this list in places where you will see it often.  Tell your family, friends, and co-workers that you are quitting. Their support is important.  Talk with your doctor about the choices that may help you quit.  Find out if your health insurance will pay for these treatments.  Know the people, places, things, and activities that make you want to smoke (triggers). Avoid them. What first steps can I take to quit smoking?  Throw away all cigarettes at home, at work, and in your car.  Throw away the things that you use when you smoke, such as ashtrays and lighters.  Clean your car. Make sure to empty the ashtray.  Clean your home, including curtains and carpets. What can I do to help me quit smoking? Talk with your doctor about taking medicines and seeing a counselor at the same time. You are more likely to succeed when you do both.  If you are pregnant or breastfeeding, talk with your doctor about counseling or other ways to quit smoking. Do not take medicine to help you quit smoking unless your doctor tells you to do so. To quit smoking: Quit right away  Quit smoking totally, instead of slowly cutting back on how much you smoke over a period of time.  Go to counseling. You are more likely to quit if you go to counseling sessions regularly. Take medicine You may take medicines to help you quit. Some medicines need a prescription, and some you can buy  over-the-counter. Some medicines may contain a drug called nicotine to replace the nicotine in cigarettes. Medicines may:  Help you to stop having the  desire to smoke (cravings).  Help to stop the problems that come when you stop smoking (withdrawal symptoms). Your doctor may ask you to use:  Nicotine patches, gum, or lozenges.  Nicotine inhalers or sprays.  Non-nicotine medicine that is taken by mouth. Find resources Find resources and other ways to help you quit smoking and remain smoke-free after you quit. These resources are most helpful when you use them often. They include:  Online chats with a Social worker.  Phone quitlines.  Printed Furniture conservator/restorer.  Support groups or group counseling.  Text messaging programs.  Mobile phone apps. Use apps on your mobile phone or tablet that can help you stick to your quit plan. There are many free apps for mobile phones and tablets as well as websites. Examples include Quit Guide from the [employer] and smokefree.gov   What things can I do to make it easier to quit?  Talk to your family and friends. Ask them to support and encourage you.  Call a phone quitline (1-800-QUIT-NOW), reach out to support groups, or work with a Social worker.  Ask people who smoke to not smoke around you.  Avoid places that make you want to smoke, such as: ? Bars. ? Parties. ? Smoke-break areas at work.  Spend time with people who do not smoke.  Lower the stress in your life. Stress can make you want to smoke. Try these things to help your stress: ? Getting regular exercise. ? Doing deep-breathing exercises. ? Doing yoga. ? Meditating. ? Doing a body scan. To do this, close your eyes, focus on one area of your body at a time from head to toe. Notice which parts of your body are tense. Try to relax the muscles in those areas.   How will I feel when I quit smoking? Day 1 to 3 weeks Within the first 24 hours, you may start to have some problems that come from  quitting tobacco. These problems are very bad 2-3 days after you quit, but they do not often last for more than 2-3 weeks. You may get these symptoms:  Mood swings.  Feeling restless, nervous, angry, or annoyed.  Trouble concentrating.  Dizziness.  Strong desire for high-sugar foods and nicotine.  Weight gain.  Trouble pooping (constipation).  Feeling like you may vomit (nausea).  Coughing or a sore throat.  Changes in how the medicines that you take for other issues work in your body.  Depression.  Trouble sleeping (insomnia). Week 3 and afterward After the first 2-3 weeks of quitting, you may start to notice more positive results, such as:  Better sense of smell and taste.  Less coughing and sore throat.  Slower heart rate.  Lower blood pressure.  Clearer skin.  Better breathing.  Fewer sick days. Quitting smoking can be hard. Do not give up if you fail the first time. Some people need to try a few times before they succeed. Do your best to stick to your quit plan, and talk with your doctor if you have any questions or concerns. Summary  Smoking tobacco is the leading cause of preventable death. Quitting smoking can be hard, but it is one of the best things that you can do for your health.  When you decide to quit smoking, make a plan to help you succeed.  Quit smoking right away, not slowly over a period of time.  When you start quitting, seek help from your doctor, family, or friends. This information is not intended to replace advice given  to you by your health care provider. Make sure you discuss any questions you have with your health care provider. Document Revised: 12/10/2018 Document Reviewed: 06/05/2018 Elsevier Patient Education  [place] Maintenance, Female Adopting a healthy lifestyle and getting preventive care are important in promoting health and wellness. Ask your health care provider about:  The right schedule for you to  have regular tests and exams.  Things you can do on your own to prevent diseases and keep yourself healthy. What should I know about diet, weight, and exercise? Eat a healthy diet  Eat a diet that includes plenty of vegetables, fruits, low-fat dairy products, and lean protein.  Do not eat a lot of foods that are high in solid fats, added sugars, or sodium.   Maintain a healthy weight Body mass index (BMI) is used to identify weight problems. It estimates body fat based on height and weight. Your health care provider can help determine your BMI and help you achieve or maintain a healthy weight. Get regular exercise Get regular exercise. This is one of the most important things you can do for your health. Most adults should:  Exercise for at least 150 minutes each week. The exercise should increase your heart rate and make you sweat (moderate-intensity exercise).  Do strengthening exercises at least twice a week. This is in addition to the moderate-intensity exercise.  Spend less time sitting. Even light physical activity can be beneficial. Watch cholesterol and blood lipids Have your blood tested for lipids and cholesterol at 48 years of age, then have this test every 5 years. Have your cholesterol levels checked more often if:  Your lipid or cholesterol levels are high.  You are older than 48 years of age.  You are at high risk for heart disease. What should I know about cancer screening? Depending on your health history and family history, you may need to have cancer screening at various ages. This may include screening for:  Breast cancer.  Cervical cancer.  Colorectal cancer.  Skin cancer.  Lung cancer. What should I know about heart disease, diabetes, and high blood pressure? Blood pressure and heart disease  High blood pressure causes heart disease and increases the risk of stroke. This is more likely to develop in people who have high blood pressure readings, are of  African descent, or are overweight.  Have your blood pressure checked: ? Every 3-5 years if you are 98-1 years of age. ? Every year if you are 64 years old or older. Diabetes Have regular diabetes screenings. This checks your fasting blood sugar level. Have the screening done:  Once every three years after age 57 if you are at a normal weight and have a low risk for diabetes.  More often and at a younger age if you are overweight or have a high risk for diabetes. What should I know about preventing infection? Hepatitis B If you have a higher risk for hepatitis B, you should be screened for this virus. Talk with your health care provider to find out if you are at risk for hepatitis B infection. Hepatitis C Testing is recommended for:  Everyone born from [birth year] through 1963.  Anyone with known risk factors for hepatitis C. Sexually transmitted infections (STIs)  Get screened for STIs, including gonorrhea and chlamydia, if: ? You are sexually active and are younger than 48 years of age. ? You are older than 48 years of age and your health care provider tells you that you  are at risk for this type of infection. ? Your sexual activity has changed since you were last screened, and you are at increased risk for chlamydia or gonorrhea. Ask your health care provider if you are at risk.  Ask your health care provider about whether you are at high risk for HIV. Your health care provider may recommend a prescription medicine to help prevent HIV infection. If you choose to take medicine to prevent HIV, you should first get tested for HIV. You should then be tested every 3 months for as long as you are taking the medicine. Pregnancy  If you are about to stop having your period (premenopausal) and you may become pregnant, seek counseling before you get pregnant.  Take 400 to 800 micrograms (mcg) of folic acid every day if you become pregnant.  Ask for birth control (contraception) if you want to  prevent pregnancy. Osteoporosis and menopause Osteoporosis is a disease in which the bones lose minerals and strength with aging. This can result in bone fractures. If you are 84 years old or older, or if you are at risk for osteoporosis and fractures, ask your health care provider if you should:  Be screened for bone loss.  Take a calcium or vitamin D supplement to lower your risk of fractures.  Be given hormone replacement therapy (HRT) to treat symptoms of menopause. Follow these instructions at home: Lifestyle  Do not use any products that contain nicotine or tobacco, such as cigarettes, e-cigarettes, and chewing tobacco. If you need help quitting, ask your health care provider.  Do not use street drugs.  Do not share needles.  Ask your health care provider for help if you need support or information about quitting drugs. Alcohol use  Do not drink alcohol if: ? Your health care provider tells you not to drink. ? You are pregnant, may be pregnant, or are planning to become pregnant.  If you drink alcohol: ? Limit how much you use to 0-1 drink a day. ? Limit intake if you are breastfeeding.  Be aware of how much alcohol is in your drink. In the [place] one drink equals one 12 oz bottle of beer (355 mL), one 5 oz glass of wine (148 mL), or one 1 oz glass of hard liquor (44 mL). General instructions  Schedule regular health, dental, and eye exams.  Stay current with your vaccines.  Tell your health care provider if: ? You often feel depressed. ? You have ever been abused or do not feel safe at home. Summary  Adopting a healthy lifestyle and getting preventive care are important in promoting health and wellness.  Follow your health care provider's instructions about healthy diet, exercising, and getting tested or screened for diseases.  Follow your health care provider's instructions on monitoring your cholesterol and blood pressure. This information is not intended to  replace advice given to you by your health care provider. Make sure you discuss any questions you have with your health care provider. Document Revised: 03/10/2018 Document Reviewed: 03/10/2018 Elsevier Patient Education  2021 [place], MD [facility]

## 2020-07-23 NOTE — Patient Instructions (Signed)
Steps to Quit Smoking Smoking tobacco is the leading cause of preventable death. It can affect almost every organ in the body. Smoking puts you and people around you at risk for many serious, long-lasting (chronic) diseases. Quitting smoking can be hard, but it is one of the best things that you can do for your health. It is never too late to quit. How do I get ready to quit? When you decide to quit smoking, make a plan to help you succeed. Before you quit:  Pick a date to quit. Set a date within the next 2 weeks to give you time to prepare.  Write down the reasons why you are quitting. Keep this list in places where you will see it often.  Tell your family, friends, and co-workers that you are quitting. Their support is important.  Talk with your doctor about the choices that may help you quit.  Find out if your health insurance will pay for these treatments.  Know the people, places, things, and activities that make you want to smoke (triggers). Avoid them. What first steps can I take to quit smoking?  Throw away all cigarettes at home, at work, and in your car.  Throw away the things that you use when you smoke, such as ashtrays and lighters.  Clean your car. Make sure to empty the ashtray.  Clean your home, including curtains and carpets. What can I do to help me quit smoking? Talk with your doctor about taking medicines and seeing a counselor at the same time. You are more likely to succeed when you do both.  If you are pregnant or breastfeeding, talk with your doctor about counseling or other ways to quit smoking. Do not take medicine to help you quit smoking unless your doctor tells you to do so. To quit smoking: Quit right away  Quit smoking totally, instead of slowly cutting back on how much you smoke over a period of time.  Go to counseling. You are more likely to quit if you go to counseling sessions regularly. Take medicine You may take medicines to help you quit. Some  medicines need a prescription, and some you can buy over-the-counter. Some medicines may contain a drug called nicotine to replace the nicotine in cigarettes. Medicines may:  Help you to stop having the desire to smoke (cravings).  Help to stop the problems that come when you stop smoking (withdrawal symptoms). Your doctor may ask you to use:  Nicotine patches, gum, or lozenges.  Nicotine inhalers or sprays.  Non-nicotine medicine that is taken by mouth. Find resources Find resources and other ways to help you quit smoking and remain smoke-free after you quit. These resources are most helpful when you use them often. They include:  Online chats with a counselor.  Phone quitlines.  Printed self-help materials.  Support groups or group counseling.  Text messaging programs.  Mobile phone apps. Use apps on your mobile phone or tablet that can help you stick to your quit plan. There are many free apps for mobile phones and tablets as well as websites. Examples include Quit Guide from the CDC and smokefree.gov   What things can I do to make it easier to quit?  Talk to your family and friends. Ask them to support and encourage you.  Call a phone quitline (1-800-QUIT-NOW), reach out to support groups, or work with a counselor.  Ask people who smoke to not smoke around you.  Avoid places that make you want to smoke,   such as: ? Bars. ? Parties. ? Smoke-break areas at work.  Spend time with people who do not smoke.  Lower the stress in your life. Stress can make you want to smoke. Try these things to help your stress: ? Getting regular exercise. ? Doing deep-breathing exercises. ? Doing yoga. ? Meditating. ? Doing a body scan. To do this, close your eyes, focus on one area of your body at a time from head to toe. Notice which parts of your body are tense. Try to relax the muscles in those areas.   How will I feel when I quit smoking? Day 1 to 3 weeks Within the first 24 hours,  you may start to have some problems that come from quitting tobacco. These problems are very bad 2-3 days after you quit, but they do not often last for more than 2-3 weeks. You may get these symptoms:  Mood swings.  Feeling restless, nervous, angry, or annoyed.  Trouble concentrating.  Dizziness.  Strong desire for high-sugar foods and nicotine.  Weight gain.  Trouble pooping (constipation).  Feeling like you may vomit (nausea).  Coughing or a sore throat.  Changes in how the medicines that you take for other issues work in your body.  Depression.  Trouble sleeping (insomnia). Week 3 and afterward After the first 2-3 weeks of quitting, you may start to notice more positive results, such as:  Better sense of smell and taste.  Less coughing and sore throat.  Slower heart rate.  Lower blood pressure.  Clearer skin.  Better breathing.  Fewer sick days. Quitting smoking can be hard. Do not give up if you fail the first time. Some people need to try a few times before they succeed. Do your best to stick to your quit plan, and talk with your doctor if you have any questions or concerns. Summary  Smoking tobacco is the leading cause of preventable death. Quitting smoking can be hard, but it is one of the best things that you can do for your health.  When you decide to quit smoking, make a plan to help you succeed.  Quit smoking right away, not slowly over a period of time.  When you start quitting, seek help from your doctor, family, or friends. This information is not intended to replace advice given to you by your health care provider. Make sure you discuss any questions you have with your health care provider. Document Revised: 12/10/2018 Document Reviewed: 06/05/2018 Elsevier Patient Education  [place] Maintenance, Female Adopting a healthy lifestyle and getting preventive care are important in promoting health and wellness. Ask your health care  provider about:  The right schedule for you to have regular tests and exams.  Things you can do on your own to prevent diseases and keep yourself healthy. What should I know about diet, weight, and exercise? Eat a healthy diet  Eat a diet that includes plenty of vegetables, fruits, low-fat dairy products, and lean protein.  Do not eat a lot of foods that are high in solid fats, added sugars, or sodium.   Maintain a healthy weight Body mass index (BMI) is used to identify weight problems. It estimates body fat based on height and weight. Your health care provider can help determine your BMI and help you achieve or maintain a healthy weight. Get regular exercise Get regular exercise. This is one of the most important things you can do for your health. Most adults should:  Exercise for at least  150 minutes each week. The exercise should increase your heart rate and make you sweat (moderate-intensity exercise).  Do strengthening exercises at least twice a week. This is in addition to the moderate-intensity exercise.  Spend less time sitting. Even light physical activity can be beneficial. Watch cholesterol and blood lipids Have your blood tested for lipids and cholesterol at 48 years of age, then have this test every 5 years. Have your cholesterol levels checked more often if:  Your lipid or cholesterol levels are high.  You are older than 48 years of age.  You are at high risk for heart disease. What should I know about cancer screening? Depending on your health history and family history, you may need to have cancer screening at various ages. This may include screening for:  Breast cancer.  Cervical cancer.  Colorectal cancer.  Skin cancer.  Lung cancer. What should I know about heart disease, diabetes, and high blood pressure? Blood pressure and heart disease  High blood pressure causes heart disease and increases the risk of stroke. This is more likely to develop in  people who have high blood pressure readings, are of African descent, or are overweight.  Have your blood pressure checked: ? Every 3-5 years if you are 70-48 years of age. ? Every year if you are 65 years old or older. Diabetes Have regular diabetes screenings. This checks your fasting blood sugar level. Have the screening done:  Once every three years after age 36 if you are at a normal weight and have a low risk for diabetes.  More often and at a younger age if you are overweight or have a high risk for diabetes. What should I know about preventing infection? Hepatitis B If you have a higher risk for hepatitis B, you should be screened for this virus. Talk with your health care provider to find out if you are at risk for hepatitis B infection. Hepatitis C Testing is recommended for:  Everyone born from [birth year] through 1963.  Anyone with known risk factors for hepatitis C. Sexually transmitted infections (STIs)  Get screened for STIs, including gonorrhea and chlamydia, if: ? You are sexually active and are younger than 48 years of age. ? You are older than 48 years of age and your health care provider tells you that you are at risk for this type of infection. ? Your sexual activity has changed since you were last screened, and you are at increased risk for chlamydia or gonorrhea. Ask your health care provider if you are at risk.  Ask your health care provider about whether you are at high risk for HIV. Your health care provider may recommend a prescription medicine to help prevent HIV infection. If you choose to take medicine to prevent HIV, you should first get tested for HIV. You should then be tested every 3 months for as long as you are taking the medicine. Pregnancy  If you are about to stop having your period (premenopausal) and you may become pregnant, seek counseling before you get pregnant.  Take 400 to 800 micrograms (mcg) of folic acid every day if you become  pregnant.  Ask for birth control (contraception) if you want to prevent pregnancy. Osteoporosis and menopause Osteoporosis is a disease in which the bones lose minerals and strength with aging. This can result in bone fractures. If you are 50 years old or older, or if you are at risk for osteoporosis and fractures, ask your health care provider if you should:  Be screened for bone loss.  Take a calcium or vitamin D supplement to lower your risk of fractures.  Be given hormone replacement therapy (HRT) to treat symptoms of menopause. Follow these instructions at home: Lifestyle  Do not use any products that contain nicotine or tobacco, such as cigarettes, e-cigarettes, and chewing tobacco. If you need help quitting, ask your health care provider.  Do not use street drugs.  Do not share needles.  Ask your health care provider for help if you need support or information about quitting drugs. Alcohol use  Do not drink alcohol if: ? Your health care provider tells you not to drink. ? You are pregnant, may be pregnant, or are planning to become pregnant.  If you drink alcohol: ? Limit how much you use to 0-1 drink a day. ? Limit intake if you are breastfeeding.  Be aware of how much alcohol is in your drink. In the [place] one drink equals one 12 oz bottle of beer (355 mL), one 5 oz glass of wine (148 mL), or one 1 oz glass of hard liquor (44 mL). General instructions  Schedule regular health, dental, and eye exams.  Stay current with your vaccines.  Tell your health care provider if: ? You often feel depressed. ? You have ever been abused or do not feel safe at home. Summary  Adopting a healthy lifestyle and getting preventive care are important in promoting health and wellness.  Follow your health care provider's instructions about healthy diet, exercising, and getting tested or screened for diseases.  Follow your health care provider's instructions on monitoring your  cholesterol and blood pressure. This information is not intended to replace advice given to you by your health care provider. Make sure you discuss any questions you have with your health care provider. Document Revised: 03/10/2018 Document Reviewed: 03/10/2018 Elsevier Patient Education  2021 [employer].

## 2020-07-23 NOTE — Assessment & Plan Note (Signed)
Well-controlled on CPAP treatment. 

## 2020-07-23 NOTE — Assessment & Plan Note (Signed)
Smoking cessation advice given.  Smoking complications including cancer, COPD and cardiovascular disease discussed with patient. Wants to start Wellbutrin 150 mg twice a day.

## 2020-07-26 ENCOUNTER — Encounter: Payer: Self-pay | Admitting: Emergency Medicine

## 2020-08-03 ENCOUNTER — Other Ambulatory Visit: Payer: Self-pay | Admitting: Emergency Medicine

## 2020-08-03 MED ORDER — PANTOPRAZOLE SODIUM 40 MG PO TBEC
40.0000 mg | DELAYED_RELEASE_TABLET | Freq: Every day | ORAL | 3 refills | Status: DC
Start: 2020-08-03 — End: 2020-08-16

## 2020-08-16 ENCOUNTER — Other Ambulatory Visit: Payer: Self-pay | Admitting: Emergency Medicine

## 2020-08-16 MED ORDER — OMEPRAZOLE 40 MG PO CPDR: 40.0000 mg | DELAYED_RELEASE_CAPSULE | Freq: Every day | ORAL | 3 refills | Status: DC

## 2020-08-23 ENCOUNTER — Other Ambulatory Visit: Payer: Self-pay | Admitting: Obstetrics

## 2020-08-23 DIAGNOSIS — Z1231 Encounter for screening mammogram for malignant neoplasm of breast: Secondary | ICD-10-CM

## 2020-08-30 ENCOUNTER — Ambulatory Visit: Payer: BC Managed Care – PPO | Admitting: Family Medicine

## 2020-09-03 ENCOUNTER — Encounter: Payer: BC Managed Care – PPO | Admitting: Family Medicine

## 2020-10-15 ENCOUNTER — Encounter: Payer: Self-pay | Admitting: Emergency Medicine

## 2020-10-17 ENCOUNTER — Other Ambulatory Visit: Payer: Self-pay | Admitting: Emergency Medicine

## 2020-10-17 MED ORDER — TRAMADOL HCL 50 MG PO TABS: 50.0000 mg | ORAL_TABLET | Freq: Three times a day (TID) | ORAL | 0 refills | Status: AC | PRN

## 2020-10-17 NOTE — Telephone Encounter (Signed)
Obviously she needs to be evaluated again.  In the meantime I will send a prescription for tramadol that she can take for severe pain.  Thanks.

## 2020-10-18 ENCOUNTER — Other Ambulatory Visit: Payer: Self-pay

## 2020-10-18 ENCOUNTER — Ambulatory Visit
Admission: RE | Admit: 2020-10-18 | Discharge: 2020-10-18 | Disposition: A | Discharge status: Home / Self Care | Payer: No Typology Code available for payment source | Source: Ambulatory Visit | Attending: Obstetrics | Admitting: Obstetrics

## 2020-10-18 ENCOUNTER — Encounter: Payer: Self-pay | Admitting: Emergency Medicine

## 2020-10-18 DIAGNOSIS — Z1231 Encounter for screening mammogram for malignant neoplasm of breast: Secondary | ICD-10-CM

## 2020-10-31 ENCOUNTER — Ambulatory Visit: Payer: PRIVATE HEALTH INSURANCE | Admitting: Obstetrics

## 2020-11-01 ENCOUNTER — Other Ambulatory Visit: Payer: Self-pay

## 2020-11-01 ENCOUNTER — Other Ambulatory Visit (HOSPITAL_COMMUNITY)
Admission: RE | Admit: 2020-11-01 | Discharge: 2020-11-01 | Disposition: A | Discharge status: Home / Self Care | Payer: No Typology Code available for payment source | Source: Ambulatory Visit | Attending: Obstetrics | Admitting: Obstetrics

## 2020-11-01 ENCOUNTER — Ambulatory Visit (INDEPENDENT_AMBULATORY_CARE_PROVIDER_SITE_OTHER): Payer: No Typology Code available for payment source | Admitting: Obstetrics

## 2020-11-01 ENCOUNTER — Encounter: Payer: Self-pay | Admitting: Obstetrics

## 2020-11-01 VITALS — BP 127/81 | HR 90 | Ht 62.0 in | Wt 181.0 lb

## 2020-11-01 DIAGNOSIS — Z01419 Encounter for gynecological examination (general) (routine) without abnormal findings: Secondary | ICD-10-CM | POA: Insufficient documentation

## 2020-11-01 DIAGNOSIS — N760 Acute vaginitis: Secondary | ICD-10-CM

## 2020-11-01 DIAGNOSIS — F17200 Nicotine dependence, unspecified, uncomplicated: Secondary | ICD-10-CM | POA: Diagnosis not present

## 2020-11-01 DIAGNOSIS — N898 Other specified noninflammatory disorders of vagina: Secondary | ICD-10-CM | POA: Diagnosis not present

## 2020-11-01 DIAGNOSIS — B9689 Other specified bacterial agents as the cause of diseases classified elsewhere: Secondary | ICD-10-CM

## 2020-11-01 DIAGNOSIS — E669 Obesity, unspecified: Secondary | ICD-10-CM

## 2020-11-01 MED ORDER — TINIDAZOLE 500 MG PO TABS: 1000.0000 mg | ORAL_TABLET | Freq: Every day | ORAL | 2 refills | Status: DC

## 2020-11-01 NOTE — Progress Notes (Addendum)
Subjective:        Wendy Howard is a 50 y.o. female here for a routine exam.  Current complaints: Vaginal discharge.    Personal health questionnaire:  Is patient [name], have a family history of breast and/or ovarian cancer: no Is there a family history of uterine cancer diagnosed at age < 66, gastrointestinal cancer, urinary tract cancer, family member who is a Field seismologist syndrome-associated carrier: no Is the patient overweight and hypertensive, family history of diabetes, personal history of gestational diabetes, preeclampsia or PCOS: no Is patient over 45, have PCOS,  family history of premature CHD under age 1, diabetes, smoke, have hypertension or peripheral artery disease:  no At any time, has a partner hit, kicked or otherwise hurt or frightened you?: no Over the past 2 weeks, have you felt down, depressed or hopeless?: no Over the past 2 weeks, have you felt little interest or pleasure in doing things?:no   Gynecologic History No LMP recorded. Contraception: none Last Pap: 10-31-2019. Results were: normal Last mammogram: 10-19-2019. Results were: normal  Obstetric History OB History  Gravida Para Term Preterm AB Living  2       1    SAB IAB Ectopic Multiple Live Births  1            # Outcome Date GA Lbr Len/2nd Weight Sex Delivery Anes PTL Lv  2 SAB           1 Gravida              Birth Comments: System Generated. Please review and update pregnancy details.    Past Medical History:  Diagnosis Date   Migraine     Past Surgical History:  Procedure Laterality Date   DILATION AND EVACUATION N/A 05/02/2013   Procedure: SUCTION DILATATION AND EVACUATION;  Surgeon: Shelly Bombard, MD;  Location: [facility];  Service: Gynecology;  Laterality: N/A;   KNEE SURGERY     KNEE SURGERY     KNEE SURGERY       Current Outpatient Medications:    aspirin-acetaminophen-caffeine (EXCEDRIN MIGRAINE) 250-250-65 MG tablet, Take 1 tablet by mouth daily as  needed for headache or migraine., Disp: , Rfl:    cetirizine (ZYRTEC) 10 MG tablet, Take 1 tablet (10 mg total) by mouth daily., Disp: 90 tablet, Rfl: 3   omeprazole (PRILOSEC) 40 MG capsule, Take 1 capsule (40 mg total) by mouth daily., Disp: 30 capsule, Rfl: 3   traMADol (ULTRAM) 50 MG tablet, Take by mouth every 6 (six) hours as needed., Disp: , Rfl:    buPROPion (WELLBUTRIN SR) 150 MG 12 hr tablet, Take 1 tablet (150 mg total) by mouth 2 (two) times daily., Disp: 180 tablet, Rfl: 1 Allergies  Allergen Reactions   Other Hives    Seafood-hives    Social History   Tobacco Use   Smoking status: Every Day    Packs/day: 0.50    Types: Cigarettes    Last attempt to quit: 03/08/2020    Years since quitting: 0.6   Smokeless tobacco: Never  Substance Use Topics   Alcohol use: Yes    Alcohol/week: 0.0 standard drinks    Comment: occaisional     Family History  Problem Relation Age of Onset   Hypertension Father    Hypertension Maternal Grandmother    Hypertension Sister       Review of Systems  Constitutional: negative for fatigue and weight loss Respiratory: negative for cough and wheezing Cardiovascular: negative for  chest pain, fatigue and palpitations Gastrointestinal: negative for abdominal pain and change in bowel habits Musculoskeletal:negative for myalgias Neurological: negative for gait problems and tremors Behavioral/Psych: negative for abusive relationship, depression Endocrine: negative for temperature intolerance    Genitourinary: positive for vaginal discharge.  negative for abnormal menstrual periods, genital lesions, hot flashes, sexual problems  Integument/breast: negative for breast lump, breast tenderness, nipple discharge and skin lesion(s)    Objective:       BP 127/81   Pulse 90   Ht '5\' 2"'$  (1.575 m)   Wt 181 lb (82.1 kg)   BMI 33.11 kg/m  General:   Alert and no distress  Skin:   no rash or abnormalities  Lungs:   clear to auscultation  bilaterally  Heart:   regular rate and rhythm, S1, S2 normal, no murmur, click, rub or gallop  Breasts:   normal without suspicious masses, skin or nipple changes or axillary nodes  Abdomen:  normal findings: no organomegaly, soft, non-tender and no hernia  Pelvis:  External genitalia: normal general appearance Urinary system: urethral meatus normal and bladder without fullness, nontender Vaginal: normal without tenderness, induration or masses Cervix: normal appearance Adnexa: normal bimanual exam Uterus: anteverted and non-tender, normal size   Lab Review Urine pregnancy test Labs reviewed yes Radiologic studies reviewed yes  I have spent a total of 20 minutes of face-to-face time, excluding clinical staff time, reviewing notes and preparing to see patient, ordering tests and/or medications, and counseling the patient.   Assessment:    1. Encounter for routine gynecological examination with Papanicolaou smear of cervix Rx: - Cytology - PAP( [facility])  2. Vaginal discharge Rx: - Cervicovaginal ancillary only( [facility])  3. Tobacco dependence - cessation with the aid of medication and behavioral modification recommended  4. Obesity (BMI 30.0-34.9) - weight reduction with the aid of dietary changes, exercise and behavioral modification recommended   5. BV (bacterial vaginosis) Rx: - tinidazole (TINDAMAX) 500 MG tablet; Take 2 tablets (1,000 mg total) by mouth daily with breakfast.  Dispense: 10 tablet; Refill: 2     Plan:    Education reviewed: calcium supplements, depression evaluation, low fat, low cholesterol diet, safe sex/STD prevention, self breast exams, smoking cessation, and weight bearing exercise. Contraception: none. Follow up in: 1 year.      Shelly Bombard, MD 11/01/2020 3:43 PM

## 2020-11-02 ENCOUNTER — Other Ambulatory Visit: Payer: Self-pay | Admitting: Obstetrics

## 2020-11-02 LAB — CERVICOVAGINAL ANCILLARY ONLY
Bacterial Vaginitis (gardnerella): POSITIVE — AB
Candida Glabrata: NEGATIVE
Candida Vaginitis: NEGATIVE
Chlamydia: NEGATIVE
Comment: NEGATIVE
Comment: NEGATIVE
Comment: NEGATIVE
Comment: NEGATIVE
Comment: NEGATIVE
Comment: NORMAL
Neisseria Gonorrhea: NEGATIVE
Trichomonas: NEGATIVE

## 2020-11-09 LAB — CYTOLOGY - PAP
Comment: NEGATIVE
Comment: NEGATIVE
Diagnosis: NEGATIVE
Diagnosis: REACTIVE
HPV 16: NEGATIVE
HPV 18 / 45: NEGATIVE
High risk HPV: POSITIVE — AB

## 2020-11-09 NOTE — Telephone Encounter (Signed)
-----   Message from Shelly Bombard, MD sent at 11/09/2020  9:21 AM EDT ----- NILM with positive HRHPV.  Repeat pap in 1 year.

## 2020-11-29 ENCOUNTER — Encounter: Payer: Self-pay | Admitting: Emergency Medicine

## 2020-11-29 NOTE — Telephone Encounter (Signed)
Appt made for 9/7 at 4p

## 2020-11-29 NOTE — Telephone Encounter (Signed)
Yes

## 2020-12-05 ENCOUNTER — Ambulatory Visit: Payer: No Typology Code available for payment source | Admitting: Emergency Medicine

## 2020-12-10 ENCOUNTER — Other Ambulatory Visit: Payer: Self-pay

## 2020-12-10 MED ORDER — METRONIDAZOLE 500 MG PO TABS: 500.0000 mg | ORAL_TABLET | Freq: Two times a day (BID) | ORAL | 0 refills | Status: DC

## 2020-12-10 NOTE — Progress Notes (Signed)
Pt requests medication for "BV." Flagyl sent to the pharmacy per protocol.

## 2021-01-18 ENCOUNTER — Encounter: Payer: Self-pay | Admitting: Gastroenterology

## 2021-01-24 ENCOUNTER — Ambulatory Visit: Payer: No Typology Code available for payment source | Admitting: Emergency Medicine

## 2021-01-30 ENCOUNTER — Ambulatory Visit (INDEPENDENT_AMBULATORY_CARE_PROVIDER_SITE_OTHER): Payer: No Typology Code available for payment source | Admitting: Emergency Medicine

## 2021-01-30 ENCOUNTER — Other Ambulatory Visit: Payer: Self-pay

## 2021-01-30 ENCOUNTER — Encounter: Payer: Self-pay | Admitting: Emergency Medicine

## 2021-01-30 VITALS — BP 132/82 | HR 96 | Ht 62.0 in | Wt 183.0 lb

## 2021-01-30 DIAGNOSIS — Z8669 Personal history of other diseases of the nervous system and sense organs: Secondary | ICD-10-CM | POA: Diagnosis not present

## 2021-01-30 DIAGNOSIS — G4733 Obstructive sleep apnea (adult) (pediatric): Secondary | ICD-10-CM | POA: Diagnosis not present

## 2021-01-30 DIAGNOSIS — Z9989 Dependence on other enabling machines and devices: Secondary | ICD-10-CM

## 2021-01-30 DIAGNOSIS — F17200 Nicotine dependence, unspecified, uncomplicated: Secondary | ICD-10-CM

## 2021-01-30 DIAGNOSIS — I471 Supraventricular tachycardia: Secondary | ICD-10-CM

## 2021-01-30 MED ORDER — TRAMADOL HCL 50 MG PO TABS: 50.0000 mg | ORAL_TABLET | Freq: Two times a day (BID) | ORAL | 1 refills | Status: DC | PRN

## 2021-01-30 NOTE — Assessment & Plan Note (Signed)
Making significant progress.  Smoking much less.  Feels better.

## 2021-01-30 NOTE — Progress Notes (Signed)
Wendy Howard 50 y.o.   Chief Complaint  Patient presents with   Follow-up    Chronic medical conditions. Med refill for Tramadol    HISTORY OF PRESENT ILLNESS: This is a 50 y.o. female here for follow-up of chronic medical conditions #1 history of OSA on CPAP.  Doing well.  Sleeping much better. #2 history of recurrent migraine headaches.  Sometimes Excedrin migraine helps.  Needs refill on tramadol. #3 history of paroxysmal SVTs.  No recent episodes. #4 current smoker but smoking a lot less.  Did not start Wellbutrin.  Making progress on her own. Requesting letter of emotional support so she can move with her dog to a different place.  HPI   Prior to Admission medications   Medication Sig Start Date End Date Taking? Authorizing Provider  aspirin-acetaminophen-caffeine (EXCEDRIN MIGRAINE) [phone] MG tablet Take 1 tablet by mouth daily as needed for headache or migraine.   Yes [provider]  buPROPion (WELLBUTRIN SR) 150 MG 12 hr tablet Take 1 tablet (150 mg total) by mouth 2 (two) times daily. 07/23/20 01/30/21 Yes Wendy Howard, Wendy Bloomer, MD  cetirizine (ZYRTEC) 10 MG tablet Take 1 tablet (10 mg total) by mouth daily. 07/23/20  Yes Sherrill Mckamie, Wendy Bloomer, MD  omeprazole (PRILOSEC) 40 MG capsule Take 1 capsule (40 mg total) by mouth daily. 08/16/20  Yes Wendy Pollen, MD  tinidazole [facility]) 500 MG tablet Take 2 tablets (1,000 mg total) by mouth daily with breakfast. 11/01/20  Yes Wendy Bombard, MD  traMADol (ULTRAM) 50 MG tablet Take by mouth every 6 (six) hours as needed.   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   Ganglion cyst of finger of right hand 03/22/2018    Past Medical History:  Diagnosis Date   Migraine     Past Surgical History:  Procedure Laterality Date   DILATION AND EVACUATION N/A 05/02/2013   Procedure: SUCTION DILATATION AND EVACUATION;  Surgeon: Wendy Bombard, MD;  Location: [facility];  Service: Gynecology;  Laterality: N/A;   KNEE SURGERY     KNEE SURGERY     KNEE SURGERY      Social History   Socioeconomic History   Marital status: Married    Spouse name: Clifton James   Number of children: 0   Years of education: Not on file   Highest education level: Not on file  Occupational History   Occupation: [employer] manager   Tobacco Use   Smoking status: Every Day    Packs/day: 0.50    Types: Cigarettes    Last attempt to quit: 03/08/2020    Years since quitting: 0.8   Smokeless tobacco: Never  Vaping Use   Vaping Use: Never used  Substance and Sexual Activity   Alcohol use: Yes    Alcohol/week: 0.0 standard drinks    Comment: occaisional    Drug use: No   Sexual activity: Yes    Partners: Male    Birth control/protection: None    Comment: monagamous partner   Other  Topics Concern   Not on file  Social History Narrative   She is married. Lives at home with husband.    Social Determinants of Health   Financial Resource Strain: Not on file  Food Insecurity: Not on file  Transportation Needs: Not on file  Physical Activity: Not on file  Stress: Not on file  Social Connections: Not on file  Intimate Partner Violence: Not on file    Family History  Problem Relation Age of Onset   Hypertension Father    Hypertension Maternal Grandmother    Hypertension Sister      Review of Systems  Constitutional: Negative.  Negative for chills and fever.  HENT: Negative.  Negative for congestion and  sore throat.   Respiratory: Negative.  Negative for cough and shortness of breath.   Cardiovascular: Negative.  Negative for chest pain and palpitations.  Gastrointestinal:  Negative for abdominal pain, diarrhea, nausea and vomiting.  Genitourinary: Negative.   Musculoskeletal:  Positive for back pain.  Skin: Negative.  Negative for rash.  Neurological:  Positive for headaches. Negative for dizziness.  All other systems reviewed and are negative.   Physical Exam Vitals reviewed.  Constitutional:      Appearance: Normal appearance.  HENT:     Head: Normocephalic.  Eyes:     Extraocular Movements: Extraocular movements intact.     Pupils: Pupils are equal, round, and reactive to light.  Cardiovascular:     Rate and Rhythm: Normal rate and regular rhythm.     Pulses: Normal pulses.     Heart sounds: Normal heart sounds.  Pulmonary:     Effort: Pulmonary effort is normal.     Breath sounds: Normal breath sounds.  Musculoskeletal:     Cervical back: Normal range of motion and neck supple.  Skin:    General: Skin is warm and dry.     Capillary Refill: Capillary refill takes less than 2 seconds.  Neurological:     General: No focal deficit present.     Mental Status: She is alert and oriented to person, place, and time.  Psychiatric:        Mood and Affect: Mood normal.        Behavior: Behavior normal.     ASSESSMENT & PLAN: Problem List Items Addressed This Visit       Cardiovascular and Mediastinum   Paroxysmal supraventricular tachycardia (HCC)    Stable.  No recent episodes.        Other   Current smoker    Making significant progress.  Smoking much less.  Feels better.      History of migraine    Much improved.  Stable.      Other Visit Diagnoses     OSA on CPAP    -  Primary      Patient Instructions  Health Maintenance, Female Adopting a healthy lifestyle and getting preventive care are important in promoting health and wellness. Ask your health  care provider about: The right schedule for you to have regular tests and exams. Things you can do on your own to prevent diseases and keep yourself healthy. What should I know about diet, weight, and exercise? Eat a healthy diet  Eat a diet that includes plenty of vegetables, fruits, low-fat dairy products, and lean protein. Do not eat a lot of foods that are high in solid fats, added sugars, or sodium. Maintain a healthy weight Body mass index (BMI) is used to identify weight problems. It estimates  body fat based on height and weight. Your health care provider can help determine your BMI and help you achieve or maintain a healthy weight. Get regular exercise Get regular exercise. This is one of the most important things you can do for your health. Most adults should: Exercise for at least 150 minutes each week. The exercise should increase your heart rate and make you sweat (moderate-intensity exercise). Do strengthening exercises at least twice a week. This is in addition to the moderate-intensity exercise. Spend less time sitting. Even light physical activity can be beneficial. Watch cholesterol and blood lipids Have your blood tested for lipids and cholesterol at 48 years of age, then have this test every 5 years. Have your cholesterol levels checked more often if: Your lipid or cholesterol levels are high. You are older than 48 years of age. You are at high risk for heart disease. What should I know about cancer screening? Depending on your health history and family history, you may need to have cancer screening at various ages. This may include screening for: Breast cancer. Cervical cancer. Colorectal cancer. Skin cancer. Lung cancer. What should I know about heart disease, diabetes, and high blood pressure? Blood pressure and heart disease High blood pressure causes heart disease and increases the risk of stroke. This is more likely to develop in people who have high blood  pressure readings, are of African descent, or are overweight. Have your blood pressure checked: Every 3-5 years if you are 71-58 years of age. Every year if you are 48 years old or older. Diabetes Have regular diabetes screenings. This checks your fasting blood sugar level. Have the screening done: Once every three years after age 22 if you are at a normal weight and have a low risk for diabetes. More often and at a younger age if you are overweight or have a high risk for diabetes. What should I know about preventing infection? Hepatitis B If you have a higher risk for hepatitis B, you should be screened for this virus. Talk with your health care provider to find out if you are at risk for hepatitis B infection. Hepatitis C Testing is recommended for: Everyone born from [birth year] through 1965. Anyone with known risk factors for hepatitis C. Sexually transmitted infections (STIs) Get screened for STIs, including gonorrhea and chlamydia, if: You are sexually active and are younger than 48 years of age. You are older than 48 years of age and your health care provider tells you that you are at risk for this type of infection. Your sexual activity has changed since you were last screened, and you are at increased risk for chlamydia or gonorrhea. Ask your health care provider if you are at risk. Ask your health care provider about whether you are at high risk for HIV. Your health care provider may recommend a prescription medicine to help prevent HIV infection. If you choose to take medicine to prevent HIV, you should first get tested for HIV. You should then be tested every 3 months for as long as you are taking the medicine. Pregnancy If you are about to stop having your period (premenopausal) and you may become pregnant, seek counseling before you get pregnant. Take 400 to 800 micrograms (mcg) of folic acid every day if you become pregnant. Ask for birth control (contraception) if you want to  prevent pregnancy. Osteoporosis and menopause Osteoporosis is a disease in which the bones lose minerals and strength with aging. This can result in bone fractures. If  you are 102 years old or older, or if you are at risk for osteoporosis and fractures, ask your health care provider if you should: Be screened for bone loss. Take a calcium or vitamin D supplement to lower your risk of fractures. Be given hormone replacement therapy (HRT) to treat symptoms of menopause. Follow these instructions at home: Lifestyle Do not use any products that contain nicotine or tobacco, such as cigarettes, e-cigarettes, and chewing tobacco. If you need help quitting, ask your health care provider. Do not use street drugs. Do not share needles. Ask your health care provider for help if you need support or information about quitting drugs. Alcohol use Do not drink alcohol if: Your health care provider tells you not to drink. You are pregnant, may be pregnant, or are planning to become pregnant. If you drink alcohol: Limit how much you use to 0-1 drink a day. Limit intake if you are breastfeeding. Be aware of how much alcohol is in your drink. In the [place] one drink equals one 12 oz bottle of beer (355 mL), one 5 oz glass of wine (148 mL), or one 1 oz glass of hard liquor (44 mL). General instructions Schedule regular health, dental, and eye exams. Stay current with your vaccines. Tell your health care provider if: You often feel depressed. You have ever been abused or do not feel safe at home. Summary Adopting a healthy lifestyle and getting preventive care are important in promoting health and wellness. Follow your health care provider's instructions about healthy diet, exercising, and getting tested or screened for diseases. Follow your health care provider's instructions on monitoring your cholesterol and blood pressure. This information is not intended to replace advice given to you by your health care  provider. Make sure you discuss any questions you have with your health care provider. Document Revised: 05/25/2020 Document Reviewed: 03/10/2018 Elsevier Patient Education  2022 [place], [place] [facility]

## 2021-01-30 NOTE — Assessment & Plan Note (Signed)
Much improved.  Stable.

## 2021-01-30 NOTE — Patient Instructions (Signed)
Health Maintenance, Female Adopting a healthy lifestyle and getting preventive care are important in promoting health and wellness. Ask your health care provider about: The right schedule for you to have regular tests and exams. Things you can do on your own to prevent diseases and keep yourself healthy. What should I know about diet, weight, and exercise? Eat a healthy diet  Eat a diet that includes plenty of vegetables, fruits, low-fat dairy products, and lean protein. Do not eat a lot of foods that are high in solid fats, added sugars, or sodium. Maintain a healthy weight Body mass index (BMI) is used to identify weight problems. It estimates body fat based on height and weight. Your health care provider can help determine your BMI and help you achieve or maintain a healthy weight. Get regular exercise Get regular exercise. This is one of the most important things you can do for your health. Most adults should: Exercise for at least 150 minutes each week. The exercise should increase your heart rate and make you sweat (moderate-intensity exercise). Do strengthening exercises at least twice a week. This is in addition to the moderate-intensity exercise. Spend less time sitting. Even light physical activity can be beneficial. Watch cholesterol and blood lipids Have your blood tested for lipids and cholesterol at 48 years of age, then have this test every 5 years. Have your cholesterol levels checked more often if: Your lipid or cholesterol levels are high. You are older than 48 years of age. You are at high risk for heart disease. What should I know about cancer screening? Depending on your health history and family history, you may need to have cancer screening at various ages. This may include screening for: Breast cancer. Cervical cancer. Colorectal cancer. Skin cancer. Lung cancer. What should I know about heart disease, diabetes, and high blood pressure? Blood pressure and heart  disease High blood pressure causes heart disease and increases the risk of stroke. This is more likely to develop in people who have high blood pressure readings, are of African descent, or are overweight. Have your blood pressure checked: Every 3-5 years if you are 18-39 years of age. Every year if you are 40 years old or older. Diabetes Have regular diabetes screenings. This checks your fasting blood sugar level. Have the screening done: Once every three years after age 40 if you are at a normal weight and have a low risk for diabetes. More often and at a younger age if you are overweight or have a high risk for diabetes. What should I know about preventing infection? Hepatitis B If you have a higher risk for hepatitis B, you should be screened for this virus. Talk with your health care provider to find out if you are at risk for hepatitis B infection. Hepatitis C Testing is recommended for: Everyone born from 1945 through 1965. Anyone with known risk factors for hepatitis C. Sexually transmitted infections (STIs) Get screened for STIs, including gonorrhea and chlamydia, if: You are sexually active and are younger than 48 years of age. You are older than 48 years of age and your health care provider tells you that you are at risk for this type of infection. Your sexual activity has changed since you were last screened, and you are at increased risk for chlamydia or gonorrhea. Ask your health care provider if you are at risk. Ask your health care provider about whether you are at high risk for HIV. Your health care provider may recommend a prescription medicine   to help prevent HIV infection. If you choose to take medicine to prevent HIV, you should first get tested for HIV. You should then be tested every 3 months for as long as you are taking the medicine. Pregnancy If you are about to stop having your period (premenopausal) and you may become pregnant, seek counseling before you get  pregnant. Take 400 to 800 micrograms (mcg) of folic acid every day if you become pregnant. Ask for birth control (contraception) if you want to prevent pregnancy. Osteoporosis and menopause Osteoporosis is a disease in which the bones lose minerals and strength with aging. This can result in bone fractures. If you are 65 years old or older, or if you are at risk for osteoporosis and fractures, ask your health care provider if you should: Be screened for bone loss. Take a calcium or vitamin D supplement to lower your risk of fractures. Be given hormone replacement therapy (HRT) to treat symptoms of menopause. Follow these instructions at home: Lifestyle Do not use any products that contain nicotine or tobacco, such as cigarettes, e-cigarettes, and chewing tobacco. If you need help quitting, ask your health care provider. Do not use street drugs. Do not share needles. Ask your health care provider for help if you need support or information about quitting drugs. Alcohol use Do not drink alcohol if: Your health care provider tells you not to drink. You are pregnant, may be pregnant, or are planning to become pregnant. If you drink alcohol: Limit how much you use to 0-1 drink a day. Limit intake if you are breastfeeding. Be aware of how much alcohol is in your drink. In the [place] one drink equals one 12 oz bottle of beer (355 mL), one 5 oz glass of wine (148 mL), or one 1 oz glass of hard liquor (44 mL). General instructions Schedule regular health, dental, and eye exams. Stay current with your vaccines. Tell your health care provider if: You often feel depressed. You have ever been abused or do not feel safe at home. Summary Adopting a healthy lifestyle and getting preventive care are important in promoting health and wellness. Follow your health care provider's instructions about healthy diet, exercising, and getting tested or screened for diseases. Follow your health care provider's  instructions on monitoring your cholesterol and blood pressure. This information is not intended to replace advice given to you by your health care provider. Make sure you discuss any questions you have with your health care provider. Document Revised: 05/25/2020 Document Reviewed: 03/10/2018 Elsevier Patient Education  2022 [employer] Inc[employer]  

## 2021-01-30 NOTE — Assessment & Plan Note (Signed)
Stable.  No recent episodes.

## 2021-03-15 ENCOUNTER — Other Ambulatory Visit: Payer: Self-pay

## 2021-03-15 ENCOUNTER — Ambulatory Visit (AMBULATORY_SURGERY_CENTER): Payer: No Typology Code available for payment source

## 2021-03-15 VITALS — Ht 62.0 in | Wt 182.0 lb

## 2021-03-15 DIAGNOSIS — Z1211 Encounter for screening for malignant neoplasm of colon: Secondary | ICD-10-CM

## 2021-03-15 MED ORDER — NA SULFATE-K SULFATE-MG SULF 17.5-3.13-1.6 GM/177ML PO SOLN
1.0000 | Freq: Once | ORAL | 0 refills | Status: AC
Start: 2021-03-15 — End: 2021-03-15

## 2021-03-15 NOTE — Progress Notes (Signed)
° ° °  Patient's pre-visit was done today over the phone with the patient   Name,DOB and address verified.   Patient denies any allergies to Eggs and Soy.  Patient denies any problems with anesthesia/sedation. Patient denies taking diet pills or blood thinners.  Denies atrial flutter or atrial fib Denies chronic constipation No home Oxygen.   Packet of Prep instructions mailed to patient including a copy of a consent form-pt is aware.  Patient understands to call us back with any questions or concerns.  Patient is aware of our care-partner policy and [id number] safety protocol.   Is taking metamucil was told to stop 5 days before procedure. Currently is going to the bathroom daily

## 2021-03-18 ENCOUNTER — Encounter: Payer: Self-pay | Admitting: Gastroenterology

## 2021-03-29 ENCOUNTER — Encounter: Payer: Self-pay | Admitting: Gastroenterology

## 2021-03-29 ENCOUNTER — Ambulatory Visit (AMBULATORY_SURGERY_CENTER): Payer: PRIVATE HEALTH INSURANCE | Admitting: Gastroenterology

## 2021-03-29 VITALS — BP 125/86 | HR 75 | Temp 97.5°F | Resp 12 | Ht 62.0 in | Wt 182.0 lb

## 2021-03-29 DIAGNOSIS — K635 Polyp of colon: Secondary | ICD-10-CM

## 2021-03-29 DIAGNOSIS — K641 Second degree hemorrhoids: Secondary | ICD-10-CM

## 2021-03-29 DIAGNOSIS — D120 Benign neoplasm of cecum: Secondary | ICD-10-CM

## 2021-03-29 DIAGNOSIS — Z1211 Encounter for screening for malignant neoplasm of colon: Secondary | ICD-10-CM

## 2021-03-29 DIAGNOSIS — D125 Benign neoplasm of sigmoid colon: Secondary | ICD-10-CM

## 2021-03-29 NOTE — Progress Notes (Signed)
VS  DT ? ?Pt's states no medical or surgical changes since previsit or office visit. ? ?

## 2021-03-29 NOTE — Progress Notes (Signed)
Report to PACU, RN, vss, BBS= Clear.  

## 2021-03-29 NOTE — Op Note (Signed)
[place] Patient Name: Wendy Howard Procedure Date: 03/29/2021 10:51 AM MRN: [id number] Endoscopist: Gerrit Heck , MD Age: 50 Referring MD:  Date of Birth: 1970/10/03 Gender: Female Account #: [id number] Procedure:                Colonoscopy Indications:              Screening for colorectal malignant neoplasm, This                            is the patient's first colonoscopy Medicines:                Monitored Anesthesia Care Procedure:                Pre-Anesthesia Assessment:                           - Prior to the procedure, a History and Physical                            was performed, and patient medications and                            allergies were reviewed. The patient's tolerance of                            previous anesthesia was also reviewed. The risks                            and benefits of the procedure and the sedation                            options and risks were discussed with the patient.                            All questions were answered, and informed consent                            was obtained. Prior Anticoagulants: The patient has                            taken no previous anticoagulant or antiplatelet                            agents. ASA Grade Assessment: II - A patient with                            mild systemic disease. After reviewing the risks                            and benefits, the patient was deemed in                            satisfactory condition to undergo the procedure.  After obtaining informed consent, the colonoscope                            was passed under direct vision. Throughout the                            procedure, the patient's blood pressure, pulse, and                            oxygen saturations were monitored continuously. The                            CF [id number] #[id number] was introduced through the anus                            and advanced  to the the cecum, identified by                            appendiceal orifice and ileocecal valve. The                            colonoscopy was performed without difficulty. The                            patient tolerated the procedure well. The quality                            of the bowel preparation was good. The ileocecal                            valve, appendiceal orifice, and rectum were                            photographed. Scope In: 10:59:37 AM Scope Out: 11:29:12 AM Scope Withdrawal Time: 0 hours 25 minutes 6 seconds  Total Procedure Duration: 0 hours 29 minutes 35 seconds  Findings:                 Skin tags were found on perianal exam.                           A 12 mm polyp was found in the cecum. The polyp was                            sessile. The polyp was removed with a cold snare.                            Resection and retrieval were complete. Estimated                            blood loss was minimal.                           13 sessile polyps were found in the recto-sigmoid  colon and sigmoid colon. The polyps were 2 to 5 mm                            in size. These polyps were removed with a cold                            snare. Resection and retrieval were complete.                            Estimated blood loss was minimal.                           Non-bleeding internal hemorrhoids were found during                            retroflexion. The hemorrhoids were medium-sized. Complications:            No immediate complications. Estimated Blood Loss:     Estimated blood loss was minimal. Impression:               - Perianal skin tags found on perianal exam.                           - One 12 mm polyp in the cecum, removed with a cold                            snare. Resected and retrieved.                           - 13 2 to 5 mm polyps at the recto-sigmoid colon                            and in the sigmoid colon, removed  with a cold                            snare. Resected and retrieved.                           - Non-bleeding internal hemorrhoids. Recommendation:           - Patient has a contact number available for                            emergencies. The signs and symptoms of potential                            delayed complications were discussed with the                            patient. Return to normal activities tomorrow.                            Written discharge instructions were provided to the  patient.                           - Resume previous diet.                           - Continue present medications.                           - Await pathology results.                           - Repeat colonoscopy for surveillance based on                            pathology results.                           - Return to GI clinic PRN.                           - Use fiber, for example Citrucel, Fibercon, Konsyl                            or Metamucil.                           - Internal hemorrhoids were noted on this study and                            may be amenable to hemorrhoid band ligation. If you                            are interested in further treatment of these                            hemorrhoids with band ligation, please contact my                            clinic to set up an appointment for evaluation and                            treatment. Gerrit Heck, MD 03/29/2021 11:34:51 AM

## 2021-03-29 NOTE — Progress Notes (Signed)
Called to room to assist during endoscopic procedure.  Patient ID and intended procedure confirmed with present staff. Received instructions for my participation in the procedure from the performing physician.  

## 2021-03-29 NOTE — Progress Notes (Signed)
GASTROENTEROLOGY PROCEDURE H&P NOTE   Primary Care Physician: Horald Pollen, MD    Reason for Procedure:  Colon Cancer screening  Plan:    Colonoscopy  Patient is appropriate for endoscopic procedure(s) in the ambulatory ([facility]) setting.  The nature of the procedure, as well as the risks, benefits, and alternatives were carefully and thoroughly reviewed with the patient. Ample time for discussion and questions allowed. The patient understood, was satisfied, and agreed to proceed.     HPI: Wendy Howard is a 50 y.o. female who presents for colonoscopy for routine Colon Cancer screening.  No active GI symptoms.  No known family history of colon cancer or related malignancy.  Patient is otherwise without complaints or active issues today.  Past Medical History:  Diagnosis Date   GERD (gastroesophageal reflux disease)    Migraine    Sleep apnea    cpap    Past Surgical History:  Procedure Laterality Date   DILATION AND EVACUATION N/A 05/02/2013   Procedure: SUCTION DILATATION AND EVACUATION;  Surgeon: Shelly Bombard, MD;  Location: [facility];  Service: Gynecology;  Laterality: N/A;   KNEE SURGERY     KNEE SURGERY     KNEE SURGERY      Prior to Admission medications   Medication Sig Start Date End Date Taking? Authorizing Provider  aspirin-acetaminophen-caffeine (EXCEDRIN MIGRAINE) [phone] MG tablet Take 1 tablet by mouth daily as needed for headache or migraine.   Yes [provider]  tinidazole (TINDAMAX) 500 MG tablet Take 2 tablets (1,000 mg total) by mouth daily with breakfast. 11/01/20  Yes Shelly Bombard, MD  cetirizine (ZYRTEC) 10 MG tablet Take 1 tablet (10 mg total) by mouth daily. 07/23/20   Horald Pollen, MD  omeprazole (PRILOSEC) 40 MG capsule Take 1 capsule (40 mg total) by mouth daily. 08/16/20   Horald Pollen, MD  Psyllium (METAMUCIL PO) Take by mouth.    [provider]  traMADol (ULTRAM) 50 MG tablet  Take 1 tablet (50 mg total) by mouth every 12 (twelve) hours as needed. 01/30/21   Horald Pollen, MD    Current Outpatient Medications  Medication Sig Dispense Refill   aspirin-acetaminophen-caffeine (EXCEDRIN MIGRAINE) [phone] MG tablet Take 1 tablet by mouth daily as needed for headache or migraine.     tinidazole (TINDAMAX) 500 MG tablet Take 2 tablets (1,000 mg total) by mouth daily with breakfast. 10 tablet 2   cetirizine (ZYRTEC) 10 MG tablet Take 1 tablet (10 mg total) by mouth daily. 90 tablet 3   omeprazole (PRILOSEC) 40 MG capsule Take 1 capsule (40 mg total) by mouth daily. 30 capsule 3   Psyllium (METAMUCIL PO) Take by mouth.     traMADol (ULTRAM) 50 MG tablet Take 1 tablet (50 mg total) by mouth every 12 (twelve) hours as needed. 30 tablet 1   No current facility-administered medications for this visit.    Allergies as of 03/29/2021 - Review Complete 03/29/2021  Allergen Reaction Noted   Other Hives 03/30/2012    Family History  Problem Relation Age of Onset   Hypertension Father    Hypertension Sister    Hypertension Maternal Grandmother    Colon cancer Neg Hx    Colon polyps Neg Hx    Esophageal cancer Neg Hx    Rectal cancer Neg Hx    Stomach cancer Neg Hx     Social History   Socioeconomic History   Marital status: Married    Spouse name:  Carlos   Number of children: 0   Years of education: Not on file   Highest education level: Not on file  Occupational History   Occupation: [employer] manager   Tobacco Use   Smoking status: Every Day    Packs/day: 0.50    Types: Cigarettes    Last attempt to quit: 03/08/2020    Years since quitting: 1.0   Smokeless tobacco: Never  Vaping Use   Vaping Use: Never used  Substance and Sexual Activity   Alcohol use: Yes    Alcohol/week: 0.0 standard drinks    Comment: occaisional    Drug use: No   Sexual activity: Yes    Partners: Male    Birth control/protection: None     Comment: monagamous partner   Other Topics Concern   Not on file  Social History Narrative   She is married. Lives at home with husband.    Social Determinants of Health   Financial Resource Strain: Not on file  Food Insecurity: Not on file  Transportation Needs: Not on file  Physical Activity: Not on file  Stress: Not on file  Social Connections: Not on file  Intimate Partner Violence: Not on file    Physical Exam: Vital signs in last 24 hours: @BP  130/78    Pulse 83    Temp (!) 97.5 F (36.4 C)    Ht 5\' 2"  (1.575 m)    Wt 182 lb (82.6 kg)    LMP 03/13/2021    SpO2 99%    BMI 33.29 kg/m  GEN: NAD EYE: Sclerae anicteric ENT: MMM CV: Non-tachycardic Pulm: CTA b/l GI: Soft, NT/ND NEURO:  Alert & Oriented x 3   Gerrit Heck, DO [facility]   03/29/2021 10:54 AM

## 2021-03-29 NOTE — Patient Instructions (Addendum)
Resume previous medications.  Await results for final recommendations.  Handouts on findings given to patient ( hemorrhoids and polyps)     Use a fiber supplement like Citrucel, Metamucil, Fiber Con.  YOU HAD AN ENDOSCOPIC PROCEDURE TODAY AT [facility]:   Refer to the procedure report that was given to you for any specific questions about what was found during the examination.  If the procedure report does not answer your questions, please call your gastroenterologist to clarify.  If you requested that your care partner not be given the details of your procedure findings, then the procedure report has been included in a sealed envelope for you to review at your convenience later.  YOU SHOULD EXPECT: Some feelings of bloating in the abdomen. Passage of more gas than usual.  Walking can help get rid of the air that was put into your GI tract during the procedure and reduce the bloating. If you had a lower endoscopy (such as a colonoscopy or flexible sigmoidoscopy) you may notice spotting of blood in your stool or on the toilet paper. If you underwent a bowel prep for your procedure, you may not have a normal bowel movement for a few days.  Please Note:  You might notice some irritation and congestion in your nose or some drainage.  This is from the oxygen used during your procedure.  There is no need for concern and it should clear up in a day or so.  SYMPTOMS TO REPORT IMMEDIATELY:  Following lower endoscopy (colonoscopy or flexible sigmoidoscopy):  Excessive amounts of blood in the stool  Significant tenderness or worsening of abdominal pains  Swelling of the abdomen that is new, acute  Fever of 100F or higher   For urgent or emergent issues, a gastroenterologist can be reached at any hour by calling [phone]. Do not use MyChart messaging for urgent concerns.    DIET:  We do recommend a small meal at first, but then you may proceed to your regular diet.  Drink plenty  of fluids but you should avoid alcoholic beverages for 24 hours.  ACTIVITY:  You should plan to take it easy for the rest of today and you should NOT DRIVE or use heavy machinery until tomorrow (because of the sedation medicines used during the test).    FOLLOW UP: Our staff will call the number listed on your records 48-72 hours following your procedure to check on you and address any questions or concerns that you may have regarding the information given to you following your procedure. If we do not reach you, we will leave a message.  We will attempt to reach you two times.  During this call, we will ask if you have developed any symptoms of COVID 19. If you develop any symptoms (ie: fever, flu-like symptoms, shortness of breath, cough etc.) before then, please call [phone].  If you test positive for Covid 19 in the 2 weeks post procedure, please call and report this information to [place].    If any biopsies were taken you will be contacted by phone or by letter within the next 1-3 weeks.  Please call us at [phone] if you have not heard about the biopsies in 3 weeks.    SIGNATURES/CONFIDENTIALITY: You and/or your care partner have signed paperwork which will be entered into your electronic medical record.  These signatures attest to the fact that that the information above on your After Visit Summary has been reviewed and is understood.  Full responsibility of the confidentiality of this discharge information lies with you and/or your care-partner.

## 2021-04-03 ENCOUNTER — Telehealth: Payer: Self-pay | Admitting: *Deleted

## 2021-04-03 NOTE — Telephone Encounter (Signed)
°  Follow up Call-  Call back number 03/29/2021  Post procedure Call Back phone  # [phone]  Permission to leave phone message Yes  Some recent data might be hidden     Patient questions:  Do you have a fever, pain , or abdominal swelling? No. Pain Score  0 *  Have you tolerated food without any problems? Yes.    Have you been able to return to your normal activities? Yes.    Do you have any questions about your discharge instructions: Diet   No. Medications  No. Follow up visit  No.  Do you have questions or concerns about your Care? No.  Actions: * If pain score is 4 or above: No action needed, pain <4.

## 2021-04-11 ENCOUNTER — Encounter: Payer: Self-pay | Admitting: Gastroenterology

## 2021-04-11 ENCOUNTER — Encounter: Payer: Self-pay | Admitting: Emergency Medicine

## 2021-04-11 MED ORDER — LINACLOTIDE 72 MCG PO CAPS: 72.0000 ug | ORAL_CAPSULE | Freq: Every day | ORAL | 0 refills | Status: DC

## 2021-04-16 ENCOUNTER — Encounter: Payer: Self-pay | Admitting: Emergency Medicine

## 2021-04-23 ENCOUNTER — Encounter: Payer: Self-pay | Admitting: Emergency Medicine

## 2021-04-24 ENCOUNTER — Telehealth: Payer: Self-pay

## 2021-04-24 NOTE — Telephone Encounter (Signed)
PA Key:  [id number] , for medication Linzess. Waiting for decision from insurance.

## 2021-05-30 ENCOUNTER — Ambulatory Visit: Payer: PRIVATE HEALTH INSURANCE | Admitting: Emergency Medicine

## 2021-07-04 ENCOUNTER — Encounter: Payer: Self-pay | Admitting: Emergency Medicine

## 2021-07-04 MED ORDER — AZELASTINE-FLUTICASONE 137-50 MCG/ACT NA SUSP: 1.0000 | Freq: Two times a day (BID) | NASAL | 1 refills | Status: DC

## 2021-07-04 NOTE — Telephone Encounter (Signed)
Ok to try dymista  - done erx ?

## 2021-07-14 ENCOUNTER — Other Ambulatory Visit: Payer: Self-pay | Admitting: Obstetrics

## 2021-07-14 DIAGNOSIS — B9689 Other specified bacterial agents as the cause of diseases classified elsewhere: Secondary | ICD-10-CM

## 2021-07-25 ENCOUNTER — Encounter: Payer: Self-pay | Admitting: Emergency Medicine

## 2021-07-25 ENCOUNTER — Ambulatory Visit: Payer: PRIVATE HEALTH INSURANCE | Admitting: Emergency Medicine

## 2021-07-25 VITALS — BP 140/74 | HR 90 | Temp 98.2°F | Ht 62.0 in | Wt 190.1 lb

## 2021-07-25 DIAGNOSIS — R202 Paresthesia of skin: Secondary | ICD-10-CM | POA: Diagnosis not present

## 2021-07-25 DIAGNOSIS — K5909 Other constipation: Secondary | ICD-10-CM | POA: Diagnosis not present

## 2021-07-25 DIAGNOSIS — G44229 Chronic tension-type headache, not intractable: Secondary | ICD-10-CM | POA: Diagnosis not present

## 2021-07-25 DIAGNOSIS — F17200 Nicotine dependence, unspecified, uncomplicated: Secondary | ICD-10-CM | POA: Diagnosis not present

## 2021-07-25 LAB — CBC WITH DIFFERENTIAL/PLATELET
Basophils Absolute: 0 10*3/uL (ref 0.0–0.1)
Basophils Relative: 0.2 % (ref 0.0–3.0)
Eosinophils Absolute: 0.2 10*3/uL (ref 0.0–0.7)
Eosinophils Relative: 2.3 % (ref 0.0–5.0)
HCT: 40.1 % (ref 36.0–46.0)
Hemoglobin: 12.9 g/dL (ref 12.0–15.0)
Lymphocytes Relative: 26.3 % (ref 12.0–46.0)
Lymphs Abs: 2.1 10*3/uL (ref 0.7–4.0)
MCHC: 32.3 g/dL (ref 30.0–36.0)
MCV: 84.6 fl (ref 78.0–100.0)
Monocytes Absolute: 0.6 10*3/uL (ref 0.1–1.0)
Monocytes Relative: 7 % (ref 3.0–12.0)
Neutro Abs: 5.2 10*3/uL (ref 1.4–7.7)
Neutrophils Relative %: 64.2 % (ref 43.0–77.0)
Platelets: 252 10*3/uL (ref 150.0–400.0)
RBC: 4.74 Mil/uL (ref 3.87–5.11)
RDW: 14.1 % (ref 11.5–15.5)
WBC: 8.2 10*3/uL (ref 4.0–10.5)

## 2021-07-25 LAB — LIPID PANEL
Cholesterol: 153 mg/dL (ref 0–200)
HDL: 34.6 mg/dL — ABNORMAL LOW (ref >39.00)
LDL Cholesterol: 95 mg/dL (ref 0–99)
NonHDL: 118.86
Total CHOL/HDL Ratio: 4
Triglycerides: 121 mg/dL (ref 0.0–149.0)
VLDL: 24.2 mg/dL (ref 0.0–40.0)

## 2021-07-25 LAB — COMPREHENSIVE METABOLIC PANEL
ALT: 14 U/L (ref 0–35)
AST: 13 U/L (ref 0–37)
Albumin: 4 g/dL (ref 3.5–5.2)
Alkaline Phosphatase: 91 U/L (ref 39–117)
BUN: 13 mg/dL (ref 6–23)
CO2: 26 mEq/L (ref 19–32)
Calcium: 8.7 mg/dL (ref 8.4–10.5)
Chloride: 104 mEq/L (ref 96–112)
Creatinine, Ser: 0.74 mg/dL (ref 0.40–1.20)
GFR: 95.5 mL/min (ref >60.00)
Glucose, Bld: 99 mg/dL (ref 70–99)
Potassium: 3.7 mEq/L (ref 3.5–5.1)
Sodium: 136 mEq/L (ref 135–145)
Total Bilirubin: 0.3 mg/dL (ref 0.2–1.2)
Total Protein: 7 g/dL (ref 6.0–8.3)

## 2021-07-25 LAB — HEMOGLOBIN A1C: Hgb A1c MFr Bld: 5.3 % (ref 4.6–6.5)

## 2021-07-25 MED ORDER — SENNA-DOCUSATE SODIUM 8.6-50 MG PO TABS: 2.0000 | ORAL_TABLET | Freq: Every day | ORAL | 1 refills | Status: DC

## 2021-07-25 MED ORDER — TRAMADOL HCL 50 MG PO TABS: 50.0000 mg | ORAL_TABLET | Freq: Two times a day (BID) | ORAL | 1 refills | Status: DC | PRN

## 2021-07-25 NOTE — Assessment & Plan Note (Signed)
Cancer and cardiovascular risks associated with smoking discussed.  Smoking cessation advice given. 

## 2021-07-25 NOTE — Patient Instructions (Signed)
Chronic Migraine Headache A migraine headache is throbbing pain that is usually on one side of the head. Migraines that keep coming back are called recurring migraines. A migraine is called a chronic migraine if it happens at least 15 days in a month for more than 3 months. Talk with your doctor about what things may bring on (trigger) your migraines. What are the causes? The exact cause of this condition is not known. A migraine may be caused when nerves in the brain become irritated and release chemicals that cause irritation and swelling (inflammation) of blood vessels. The irritation and swelling of the blood vessels causes pain. Migraines may be brought on or caused by: Smoking. Foods and drinks, such as: Cheese. Chocolate. Alcohol. Caffeine. Certain substances in some foods or drinks. Some medicines. Other things that may bring on a migraine include: Periods, for women. Stress. Not enough sleep or too much sleep. Feeling very tired. Bright lights or loud noises. Smells Weather changes and being at high altitude. What increases the risk? The following factors may make you more likely to have chronic migraine: Having migraines or family members who have them. Being very sad (depressed) or feeling worried or nervous (anxious). Taking a lot of pain medicine. Having problems sleeping. Having heart disease, diabetes, or being very overweight (obese). What are the signs or symptoms? Symptoms of this condition include: Pain that feels like it throbs. Pain that is usually only on one side of the head. In some cases, the pain may be on both sides of the head or around the head or neck. Very bad pain that keeps you from doing daily activities. Pain that gets worse with activity. Feeling like you may vomit (feeling nauseous) or vomiting. Pain when you are around bright lights, loud noises, or activity. Being sensitive to bright lights, loud noises, or smells. Feeling dizzy. How is  this treated? This condition is treated with: Medicines. These help to: Lessen pain and the feeling like you may vomit. Prevent migraines. Changes to your diet or sleep. Therapy. This might include: Relaxation training. Biofeedback. This is a treatment that teaches you to relax, use your brain to lower your heart rate, and control your breathing. Cognitive behavioral therapy (CBT). This therapy helps you set goals and follow up on the changes that you make. Acupuncture. Using a device that provides electrical stimulation to your nerves, which can help take away pain. Surgery, if the other treatments do not work. Follow these instructions at home: Medicines Take over-the-counter and prescription medicines only as told by your doctor. Ask your doctor if the medicine prescribed to you requires you to avoid driving or using machinery. Lifestyle  Do not use any products that contain nicotine or tobacco, such as cigarettes, e-cigarettes, and chewing tobacco. If you need help quitting, ask your doctor. Do not drink alcohol. Get 7-9 hours of sleep each night. Lower the stress in your life. Ask your doctor about ways to do this. Stay at a healthy weight. Talk with your doctor if you need help losing weight. Get regular exercise. General instructions  Keep a journal to find out if certain things bring on migraines. For example, write down: What you eat and drink. How much sleep you get. Any change to your diet or medicines. Lie down in a dark, quiet room when you have a migraine. Try placing a cool towel over your head when you have a migraine. Keep lights dim if bright lights bother you or make your migraines worse. Keep   all follow-up visits as told by your doctor. This is important. Where to find more information Coalition for Headache and Migraine Patients (CHAMP): headachemigraine.org American Migraine Foundation: americanmigrainefoundation.org National Headache Foundation:  headaches.org Contact a doctor if: Medicine does not help your migraine. Your pain keeps coming back. Get help right away if: Your migraine becomes really bad and medicine does not help. You have a stiff neck and fever. You have trouble seeing. Your muscles are weak or you lose control of them. You lose your balance or have trouble walking. You feel like you will faint or you faint. You start having sudden, very bad headaches. You have a seizure. Summary A migraine headache is very bad, throbbing pain that is usually on one side of the head. A chronic migraine is a migraine that happens 15 days in a month for more than 3 months. Talk with your doctor about what things may bring on your migraines. Lie down in a dark, quiet room when you have a migraine. Keep a journal. This can help you find out if certain things make you have migraines. This information is not intended to replace advice given to you by your health care provider. Make sure you discuss any questions you have with your health care provider. Document Revised: 05/04/2019 Document Reviewed: 05/04/2019 Elsevier Patient Education  2023 Elsevier Inc.  

## 2021-07-25 NOTE — Assessment & Plan Note (Signed)
Metamucil not enough.  Recommend senna docusate daily. ?Dietary approaches to constipation discussed. ?Advised to increase fiber intake on a daily basis. ?

## 2021-07-25 NOTE — Assessment & Plan Note (Signed)
Chronic and intermittent headaches.  Migraine-like headaches. ?May benefit from occasional tramadol.  Using it judiciously. ?No red flag signs or symptoms. ?

## 2021-07-25 NOTE — Progress Notes (Signed)
Wendy Howard ?51 y.o. ? ? ?Chief Complaint  ?Patient presents with  ? Headache  ? Tingling  ?  Fingers at night, numbness   ? ? ?HISTORY OF PRESENT ILLNESS: ?This is a 51 y.o. female with history of chronic headaches and occasional tingling to fingers mostly at night. ?No other associated symptoms. ?No other complaints or medical concerns today. ? ?Headache  ?Associated symptoms include tingling. Pertinent negatives include no abdominal pain, coughing, fever, nausea, seizures, sore throat, vomiting or weakness.  ? ? ?Prior to Admission medications   ?Medication Sig Start Date End Date Taking? Authorizing Provider  ?aspirin-acetaminophen-caffeine (EXCEDRIN MIGRAINE) 250-250-65 MG tablet Take 1 tablet by mouth daily as needed for headache or migraine.   Yes [provider]  ?Azelastine-Fluticasone 137-50 MCG/ACT SUSP Place 1 spray into the nose every 12 (twelve) hours. 07/04/21  Yes Biagio Borg, MD  ?cetirizine (ZYRTEC) 10 MG tablet Take 1 tablet (10 mg total) by mouth daily. 07/23/20  Yes Trevonne Nyland, Ines Bloomer, MD  ?omeprazole (PRILOSEC) 40 MG capsule Take 1 capsule (40 mg total) by mouth daily. 08/16/20  Yes Babbette Dalesandro, Ines Bloomer, MD  ?sennosides-docusate sodium (SENOKOT-S) 8.6-50 MG tablet Take 2 tablets by mouth daily. 07/25/21  Yes Rilda Bulls, Ines Bloomer, MD  ?tinidazole (TINDAMAX) 500 MG tablet TAKE 2 TABLETS BY MOUTH ONCE DAILY WITH BREAKFAST 07/15/21  Yes Shelly Bombard, MD  ?traMADol (ULTRAM) 50 MG tablet Take 1 tablet (50 mg total) by mouth every 12 (twelve) hours as needed. 07/25/21   Horald Pollen, MD  ? ? ?Allergies  ?Allergen Reactions  ? Other Hives  ?  Seafood-hives  ? ? ?Patient Active Problem List  ? Diagnosis Date Noted  ? History of migraine 07/23/2020  ? Severe obstructive sleep apnea-hypopnea syndrome 08/25/2019  ? Chronic intermittent hypoxia with obstructive sleep apnea 08/25/2019  ? Paroxysmal supraventricular tachycardia ([place]) 09/08/2018  ? Narrow pharyngeal  airway 09/08/2018  ? Current smoker 09/08/2018  ? Arthritis of wrist 03/22/2018  ? Ganglion cyst of finger of right hand 03/22/2018  ? ? ?Past Medical History:  ?Diagnosis Date  ? GERD (gastroesophageal reflux disease)   ? Migraine   ? Sleep apnea   ? cpap  ? ? ?Past Surgical History:  ?Procedure Laterality Date  ? DILATION AND EVACUATION N/A 05/02/2013  ? Procedure: SUCTION DILATATION AND EVACUATION;  Surgeon: Shelly Bombard, MD;  Location: [facility];  Service: Gynecology;  Laterality: N/A;  ? KNEE SURGERY    ? KNEE SURGERY    ? KNEE SURGERY    ? ? ?Social History  ? ?Socioeconomic History  ? Marital status: Married  ?  Spouse name: Clifton James  ? Number of children: 0  ? Years of education: Not on file  ? Highest education level: Not on file  ?Occupational History  ? Occupation: [employer] manager   ?Tobacco Use  ? Smoking status: Every Day  ?  Packs/day: 0.50  ?  Types: Cigarettes  ?  Last attempt to quit: 03/08/2020  ?  Years since quitting: 1.3  ? Smokeless tobacco: Never  ?Vaping Use  ? Vaping Use: Never used  ?Substance and Sexual Activity  ? Alcohol use: Yes  ?  Alcohol/week: 0.0 standard drinks  ?  Comment: occaisional   ? Drug use: No  ? Sexual activity: Yes  ?  Partners: Male  ?  Birth control/protection: None  ?  Comment: monagamous partner   ?Other Topics Concern  ? Not on file  ?  Social History Narrative  ? She is married. Lives at home with husband.   ? ?Social Determinants of Health  ? ?Financial Resource Strain: Not on file  ?Food Insecurity: Not on file  ?Transportation Needs: Not on file  ?Physical Activity: Not on file  ?Stress: Not on file  ?Social Connections: Not on file  ?Intimate Partner Violence: Not on file  ? ? ?Family History  ?Problem Relation Age of Onset  ? Hypertension Father   ? Hypertension Sister   ? Hypertension Maternal Grandmother   ? Colon cancer Neg Hx   ? Colon polyps Neg Hx   ? Esophageal cancer Neg Hx   ? Rectal cancer Neg Hx   ? Stomach cancer Neg Hx    ? ? ? ?Review of Systems  ?Constitutional: Negative.  Negative for chills and fever.  ?HENT: Negative.  Negative for congestion and sore throat.   ?Respiratory: Negative.  Negative for cough and shortness of breath.   ?Cardiovascular: Negative.  Negative for chest pain and palpitations.  ?Gastrointestinal:  Positive for constipation. Negative for abdominal pain, diarrhea, nausea and vomiting.  ?Genitourinary: Negative.   ?Musculoskeletal: Negative.   ?Skin: Negative.  Negative for rash.  ?Neurological:  Positive for tingling and headaches. Negative for focal weakness, seizures, loss of consciousness and weakness.  ?All other systems reviewed and are negative. ? ?Today's Vitals  ? 07/25/21 1405  ?BP: 140/74  ?Pulse: 90  ?Temp: 98.2 ?F (36.8 ?C)  ?TempSrc: Oral  ?SpO2: 98%  ?Weight: 190 lb 2 oz (86.2 kg)  ?Height: '5\' 2"'$  (1.575 m)  ? ?Body mass index is 34.77 kg/m?. ? ?Physical Exam ?Vitals reviewed.  ?Constitutional:   ?   Appearance: She is well-developed.  ?HENT:  ?   Head: Normocephalic.  ?Eyes:  ?   Extraocular Movements: Extraocular movements intact.  ?   Conjunctiva/sclera: Conjunctivae normal.  ?   Pupils: Pupils are equal, round, and reactive to light.  ?Cardiovascular:  ?   Rate and Rhythm: Normal rate and regular rhythm.  ?   Pulses: Normal pulses.  ?   Heart sounds: Normal heart sounds.  ?Pulmonary:  ?   Effort: Pulmonary effort is normal.  ?   Breath sounds: Normal breath sounds.  ?Musculoskeletal:     ?   General: Normal range of motion.  ?Skin: ?   General: Skin is warm and dry.  ?   Capillary Refill: Capillary refill takes less than 2 seconds.  ?Neurological:  ?   General: No focal deficit present.  ?   Mental Status: She is alert and oriented to person, place, and time.  ?   Cranial Nerves: No cranial nerve deficit.  ?   Sensory: No sensory deficit.  ?   Motor: No weakness.  ?   Gait: Gait normal.  ?Psychiatric:     ?   Mood and Affect: Mood normal.     ?   Behavior: Behavior normal.   ? ? ? ?ASSESSMENT & PLAN: ?Problem List Items Addressed This Visit   ? ?  ? Digestive  ? Chronic constipation  ?  Metamucil not enough.  Recommend senna docusate daily. ?Dietary approaches to constipation discussed. ?Advised to increase fiber intake on a daily basis. ? ?  ?  ? Relevant Medications  ? sennosides-docusate sodium (SENOKOT-S) 8.6-50 MG tablet  ?  ? Other  ? Current smoker  ?  Cancer and cardiovascular risks associated with smoking discussed.  Smoking cessation advice given. ? ?  ?  ?  Chronic tension-type headache, not intractable - Primary  ?  Chronic and intermittent headaches.  Migraine-like headaches. ?May benefit from occasional tramadol.  Using it judiciously. ?No red flag signs or symptoms. ? ?  ?  ? Relevant Medications  ? traMADol (ULTRAM) 50 MG tablet  ? ?Other Visit Diagnoses   ? ? Paresthesia of finger      ? Relevant Orders  ? Comprehensive metabolic panel  ? CBC with Differential/Platelet  ? Hemoglobin A1c  ? Lipid panel  ? ?  ? ?Patient Instructions  ?Chronic Migraine Headache ?A migraine headache is throbbing pain that is usually on one side of the head. Migraines that keep coming back are called recurring migraines. A migraine is called a chronic migraine if it happens at least 15 days in a month for more than 3 months. ?Talk with your doctor about what things may bring on (trigger) your migraines. ?What are the causes? ?The exact cause of this condition is not known. A migraine may be caused when nerves in the brain become irritated and release chemicals that cause irritation and swelling (inflammation) of blood vessels. The irritation and swelling of the blood vessels causes pain. ?Migraines may be brought on or caused by: ?Smoking. ?Foods and drinks, such as: ?Cheese. ?Chocolate. ?Alcohol. ?Caffeine. ?Certain substances in some foods or drinks. ?Some medicines. ?Other things that may bring on a migraine include: ?Periods, for women. ?Stress. ?Not enough sleep or too much  sleep. ?Feeling very tired. ?Bright lights or loud noises. ?Smells ?Weather changes and being at high altitude. ?What increases the risk? ?The following factors may make you more likely to have chronic migraine: ?Having migraines or famil

## 2021-08-14 ENCOUNTER — Other Ambulatory Visit: Payer: Self-pay | Admitting: Emergency Medicine

## 2021-08-26 IMAGING — MG MM DIGITAL SCREENING BILAT W/ TOMO AND CAD
8 series · 9 of 24 positions shown · non-contrast
Comparison: Previous exam(s).

CLINICAL DATA: Screening.

EXAM:
DIGITAL SCREENING BILATERAL MAMMOGRAM WITH TOMOSYNTHESIS AND CAD
TECHNIQUE: Bilateral screening digital craniocaudal and mediolateral oblique
mammograms were obtained. Bilateral screening digital breast
tomosynthesis was performed. The images were evaluated with
computer-aided detection.

[L CC synth-2D]
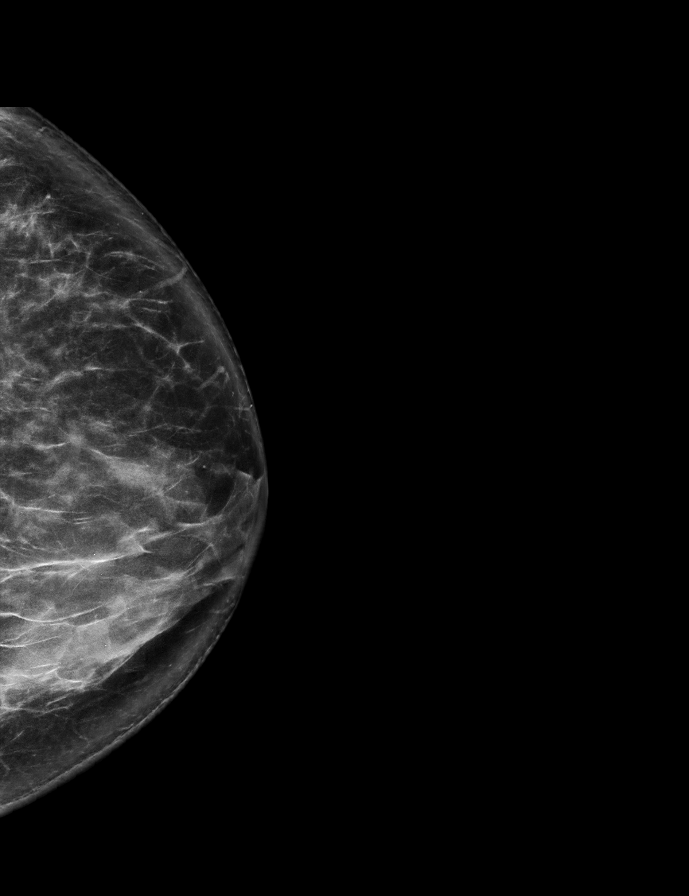

[R MLO synth-2D]
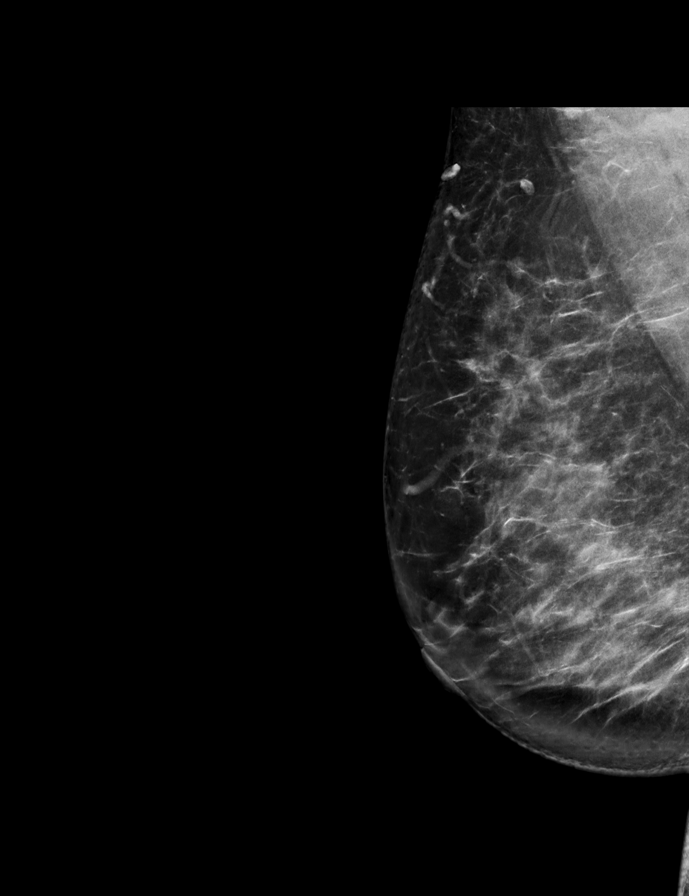

[L MLO synth-2D]
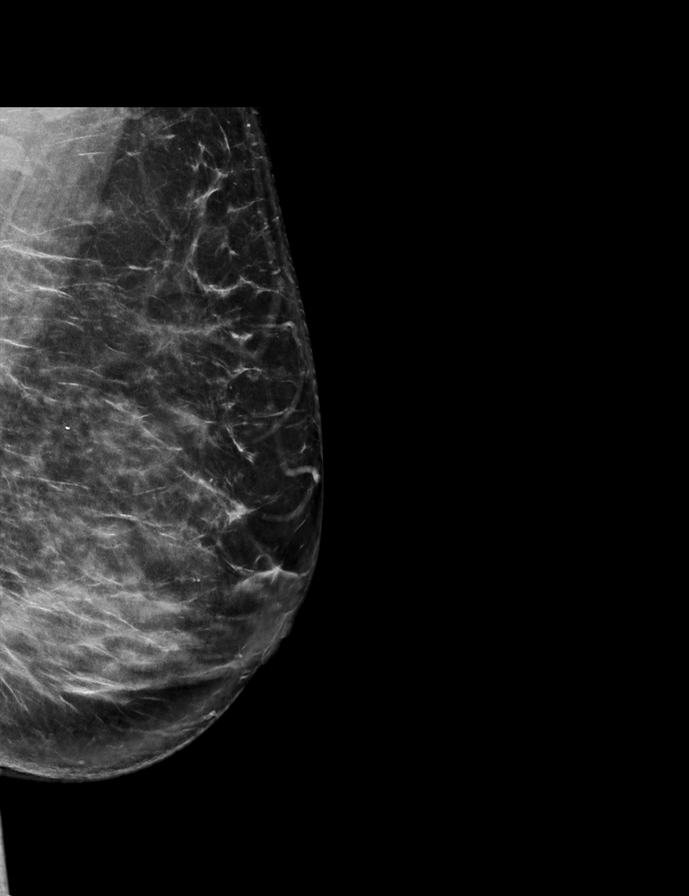

[R CC synth-2D]
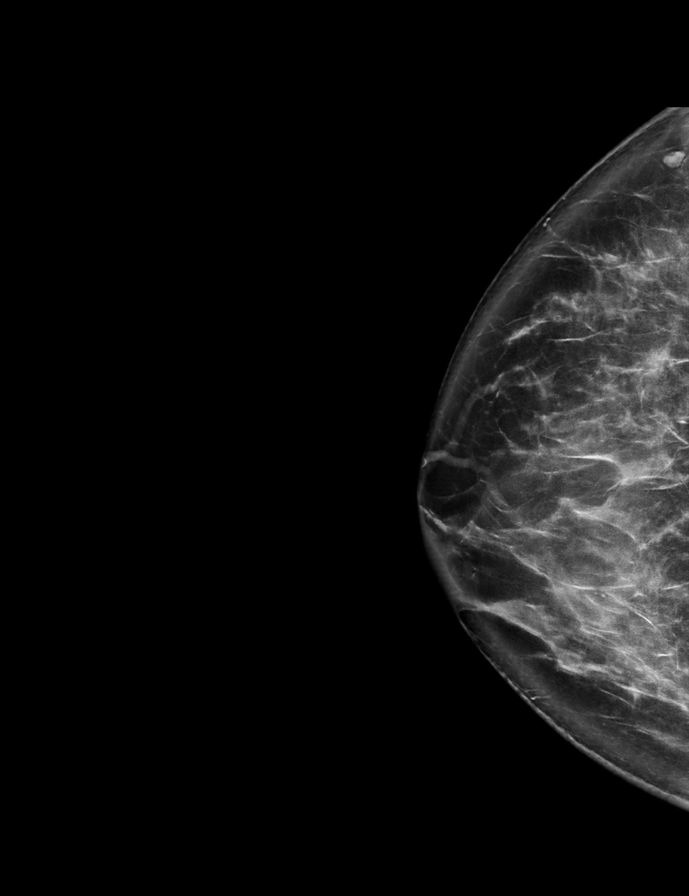

[R CC tomo · 2 of 82 frames shown]
[frame 27/82]
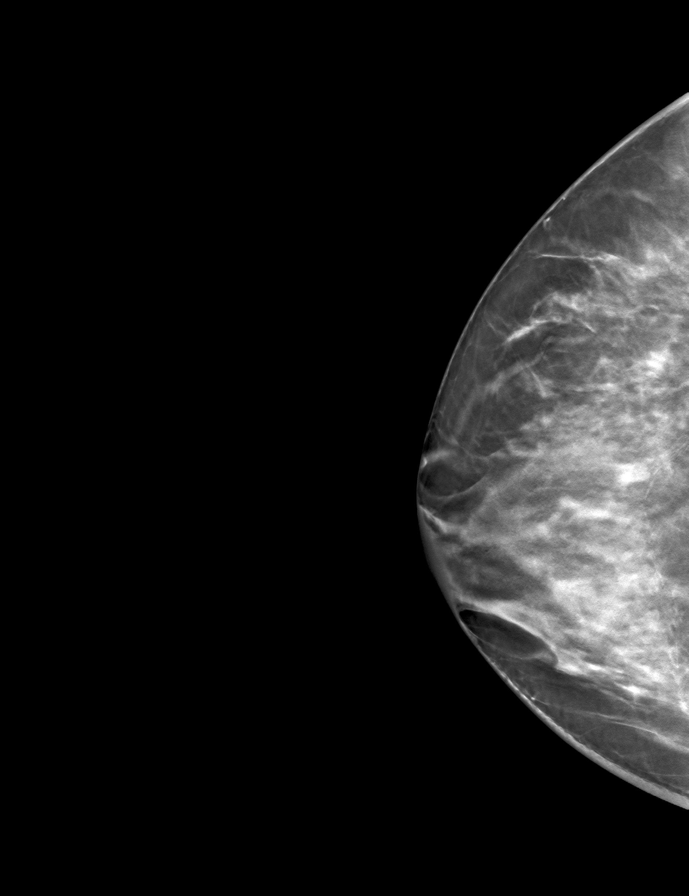
[frame 41/82]
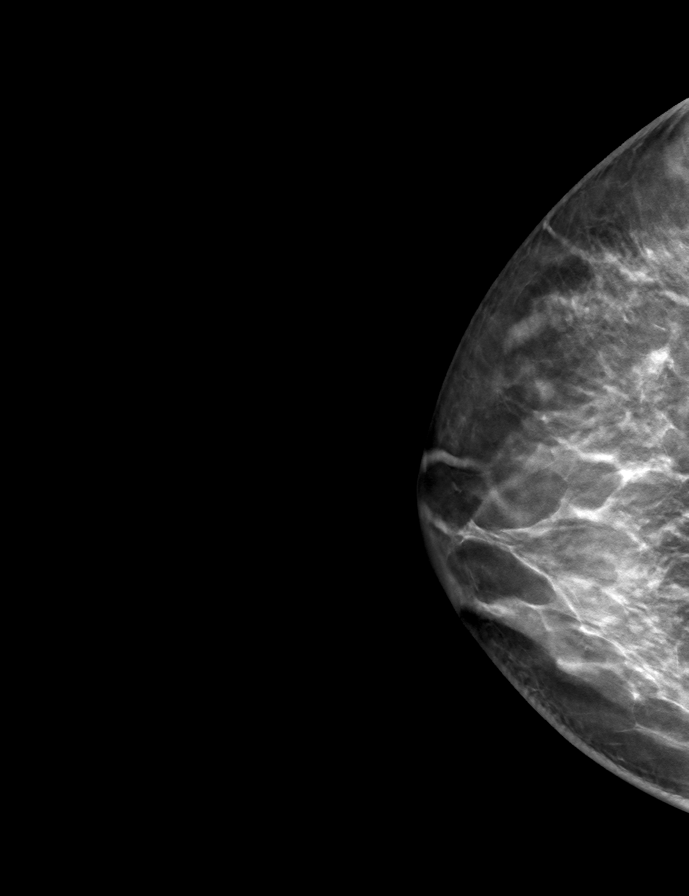

[L MLO tomo · tomo slice 44/87.0]
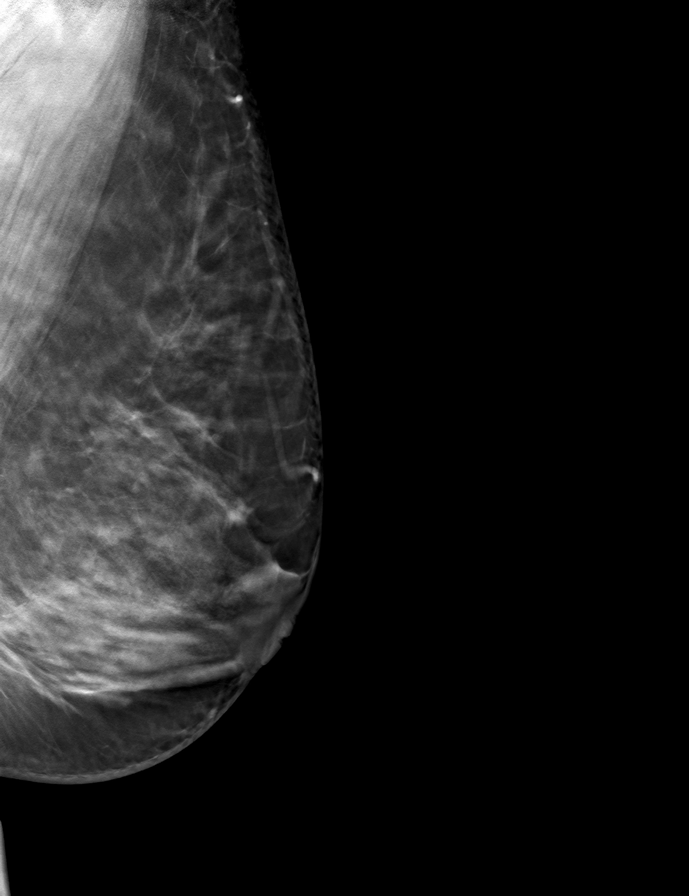

[R MLO tomo · tomo slice 46/91.0]
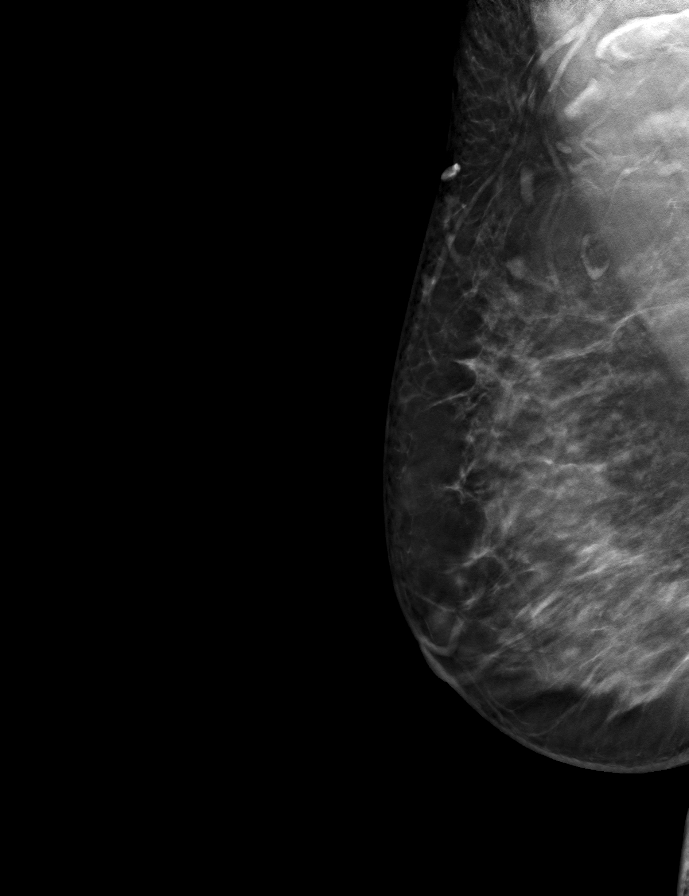

[L CC tomo · tomo slice 41/80.0]
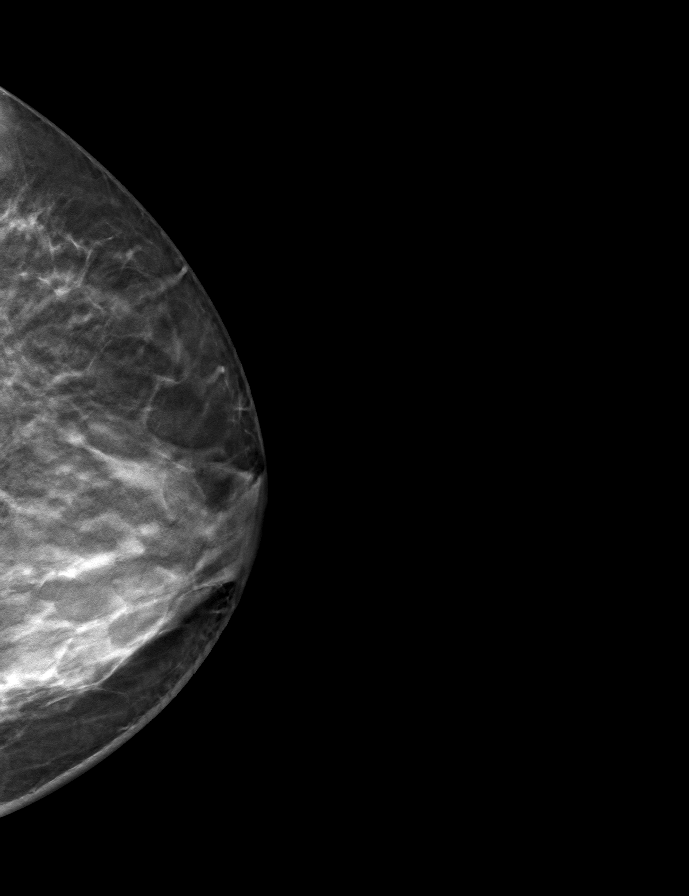

[9 of 24 positions shown; findings below may reference images not displayed]

ACR Breast Density Category c: The breast tissue is heterogeneously
dense, which may obscure small masses.
FINDINGS: There are no findings suspicious for malignancy.
IMPRESSION: No mammographic evidence of malignancy. A result letter of this
screening mammogram will be mailed directly to the patient.

RECOMMENDATION:
Screening mammogram in one year. (Code:Q3-W-BC3)

BI-RADS CATEGORY  1: Negative.

## 2021-09-19 ENCOUNTER — Other Ambulatory Visit: Payer: Self-pay | Admitting: Obstetrics

## 2021-09-19 ENCOUNTER — Other Ambulatory Visit: Payer: Self-pay | Admitting: Obstetrics and Gynecology

## 2021-09-19 DIAGNOSIS — Z1231 Encounter for screening mammogram for malignant neoplasm of breast: Secondary | ICD-10-CM

## 2021-09-23 ENCOUNTER — Encounter: Payer: Self-pay | Admitting: Obstetrics and Gynecology

## 2021-09-23 ENCOUNTER — Encounter: Payer: Self-pay | Admitting: Obstetrics

## 2021-09-24 ENCOUNTER — Other Ambulatory Visit: Payer: Self-pay

## 2021-09-24 MED ORDER — METRONIDAZOLE 0.75 % VA GEL: 1.0000 | Freq: Two times a day (BID) | VAGINAL | 0 refills | Status: DC

## 2021-10-17 ENCOUNTER — Other Ambulatory Visit: Payer: Self-pay | Admitting: Emergency Medicine

## 2021-10-18 ENCOUNTER — Encounter: Payer: Self-pay | Admitting: Emergency Medicine

## 2021-10-22 ENCOUNTER — Ambulatory Visit: Payer: PRIVATE HEALTH INSURANCE

## 2021-10-31 ENCOUNTER — Ambulatory Visit
Admission: RE | Admit: 2021-10-31 | Discharge: 2021-10-31 | Disposition: A | Discharge status: Home / Self Care | Payer: PRIVATE HEALTH INSURANCE | Source: Ambulatory Visit | Attending: Obstetrics and Gynecology | Admitting: Obstetrics and Gynecology

## 2021-10-31 DIAGNOSIS — Z1231 Encounter for screening mammogram for malignant neoplasm of breast: Secondary | ICD-10-CM

## 2021-11-08 ENCOUNTER — Ambulatory Visit: Payer: PRIVATE HEALTH INSURANCE | Admitting: Obstetrics and Gynecology

## 2021-11-28 ENCOUNTER — Other Ambulatory Visit: Payer: Self-pay | Admitting: Obstetrics

## 2021-11-28 ENCOUNTER — Encounter: Payer: Self-pay | Admitting: Obstetrics and Gynecology

## 2021-12-03 ENCOUNTER — Encounter: Payer: Self-pay | Admitting: Obstetrics and Gynecology

## 2022-01-01 ENCOUNTER — Other Ambulatory Visit (HOSPITAL_COMMUNITY)
Admission: RE | Admit: 2022-01-01 | Discharge: 2022-01-01 | Disposition: A | Discharge status: Home / Self Care | Payer: PRIVATE HEALTH INSURANCE | Source: Ambulatory Visit | Attending: Obstetrics and Gynecology | Admitting: Obstetrics and Gynecology

## 2022-01-01 ENCOUNTER — Encounter: Payer: Self-pay | Admitting: Obstetrics and Gynecology

## 2022-01-01 ENCOUNTER — Ambulatory Visit (INDEPENDENT_AMBULATORY_CARE_PROVIDER_SITE_OTHER): Payer: PRIVATE HEALTH INSURANCE | Admitting: Obstetrics and Gynecology

## 2022-01-01 DIAGNOSIS — Z202 Contact with and (suspected) exposure to infections with a predominantly sexual mode of transmission: Secondary | ICD-10-CM | POA: Insufficient documentation

## 2022-01-01 DIAGNOSIS — Z01419 Encounter for gynecological examination (general) (routine) without abnormal findings: Secondary | ICD-10-CM | POA: Insufficient documentation

## 2022-01-01 MED ORDER — METRONIDAZOLE 0.75 % VA GEL: 1.0000 | Freq: Two times a day (BID) | VAGINAL | 3 refills | Status: DC

## 2022-01-01 NOTE — Progress Notes (Addendum)
51 y.o GYN presents for AEX/PAP.  C/o white vaginal discharge, recurrent BV.  Last PAP 11/01/20 +High risk HPV.

## 2022-01-01 NOTE — Progress Notes (Signed)
Wendy Howard is a 51 y.o. G72P0010 female here for a routine annual gynecologic exam.  Cycles are staring to become more irregular. Some menopausal Sx as well   Denies abnormal vaginal bleeding, discharge, pelvic pain, problems with intercourse or other gynecologic concerns.    Gynecologic History Patient's last menstrual period was 11/05/2021 (exact date). Contraception: none Last Pap: 2022. Results were: normal HPV + Last mammogram: 9/23. Results were: normal  Obstetric History OB History  Gravida Para Term Preterm AB Living  2       1    SAB IAB Ectopic Multiple Live Births  1            # Outcome Date GA Lbr Len/2nd Weight Sex Delivery Anes PTL Lv  2 SAB           1 Gravida              Birth Comments: System Generated. Please review and update pregnancy details.    Past Medical History:  Diagnosis Date   GERD (gastroesophageal reflux disease)    Migraine    Sleep apnea    cpap    Past Surgical History:  Procedure Laterality Date   DILATION AND EVACUATION N/A 05/02/2013   Procedure: SUCTION DILATATION AND EVACUATION;  Surgeon: Shelly Bombard, MD;  Location: [facility];  Service: Gynecology;  Laterality: N/A;   KNEE SURGERY     KNEE SURGERY     KNEE SURGERY      Current Outpatient Medications on File Prior to Visit  Medication Sig Dispense Refill   aspirin-acetaminophen-caffeine (EXCEDRIN MIGRAINE) 250-250-65 MG tablet Take 1 tablet by mouth daily as needed for headache or migraine.     cetirizine (EQ ALLERGY RELIEF, CETIRIZINE,) 10 MG tablet Take 1 tablet by mouth once daily 90 tablet 3   omeprazole (PRILOSEC) 40 MG capsule Take 1 capsule (40 mg total) by mouth daily. 30 capsule 3   traMADol (ULTRAM) 50 MG tablet Take 1 tablet (50 mg total) by mouth every 12 (twelve) hours as needed. 30 tablet 1   No current facility-administered medications on file prior to visit.    Allergies  Allergen Reactions   Other Hives    Seafood-hives    Social History    Socioeconomic History   Marital status: Married    Spouse name: Clifton James   Number of children: 0   Years of education: Not on file   Highest education level: Not on file  Occupational History   Occupation: [employer] manager   Tobacco Use   Smoking status: Every Day    Packs/day: 0.50    Types: Cigarettes    Last attempt to quit: 03/08/2020    Years since quitting: 1.8   Smokeless tobacco: Never  Vaping Use   Vaping Use: Never used  Substance and Sexual Activity   Alcohol use: Yes    Alcohol/week: 0.0 standard drinks of alcohol    Comment: occaisional    Drug use: No   Sexual activity: Yes    Partners: Male    Birth control/protection: None    Comment: monagamous partner   Other Topics Concern   Not on file  Social History Narrative   She is married. Lives at home with husband.    Social Determinants of Health   Financial Resource Strain: Low Risk  (05/20/2017)   Overall Financial Resource Strain (CARDIA)    Difficulty of Paying Living Expenses: Not hard at all  Food Insecurity: No Food  Insecurity (05/20/2017)   Hunger Vital Sign    Worried About Running Out of Food in the Last Year: Never true    Ran Out of Food in the Last Year: Never true  Transportation Needs: No Transportation Needs (05/20/2017)   PRAPARE - Hydrologist (Medical): No    Lack of Transportation (Non-Medical): No  Physical Activity: Inactive (05/20/2017)   Exercise Vital Sign    Days of Exercise per Week: 0 days    Minutes of Exercise per Session: 0 min  Stress: No Stress Concern Present (05/20/2017)   [place]    Feeling of Stress : Only a little  Social Connections: Moderately Isolated (05/20/2017)   Social Connection and Isolation Panel [NHANES]    Frequency of Communication with Friends and Family: Twice a week    Frequency of Social Gatherings with Friends and Family: Never     Attends Religious Services: Never    Marine scientist or Organizations: No    Attends Archivist Meetings: Never    Marital Status: Married  Human resources officer Violence: Not At Risk (05/20/2017)   Humiliation, Afraid, Rape, and Kick questionnaire    Fear of Current or Ex-Partner: No    Emotionally Abused: No    Physically Abused: No    Sexually Abused: No    Family History  Problem Relation Age of Onset   Hypertension Father    Hypertension Sister    Hypertension Maternal Grandmother    Colon cancer Neg Hx    Colon polyps Neg Hx    Esophageal cancer Neg Hx    Rectal cancer Neg Hx    Stomach cancer Neg Hx     The following portions of the patient's history were reviewed and updated as appropriate: allergies, current medications, past family history, past medical history, past social history, past surgical history and problem list.  Review of Systems Pertinent items noted in HPI and remainder of comprehensive ROS otherwise negative.   Objective:  BP (!) 147/79   Pulse 89   Ht '5\' 2"'$  (1.575 m)   Wt 187 lb (84.8 kg)   LMP 11/05/2021 (Exact Date)   BMI 34.20 kg/m  Chaperone present CONSTITUTIONAL: Well-developed, well-nourished female in no acute distress.  HENT:  Normocephalic, atraumatic, External right and left ear normal. Oropharynx is clear and moist EYES: Conjunctivae and EOM are normal. Pupils are equal, round, and reactive to light. No scleral icterus.  NECK: Normal range of motion, supple, no masses.  Normal thyroid.  SKIN: Skin is warm and dry. No rash noted. Not diaphoretic. No erythema. No pallor. [place]: Alert and oriented to person, place, and time. Normal reflexes, muscle tone coordination. No cranial nerve deficit noted. PSYCHIATRIC: Normal mood and affect. Normal behavior. Normal judgment and thought content. CARDIOVASCULAR: Normal heart rate noted, regular rhythm RESPIRATORY: Clear to auscultation bilaterally. Effort and breath sounds normal,  no problems with respiration noted. BREASTS: Symmetric in size. No masses, skin changes, nipple drainage, or lymphadenopathy. ABDOMEN: Soft, normal bowel sounds, no distention noted.  No tenderness, rebound or guarding.  PELVIC: Normal appearing external genitalia; normal appearing vaginal mucosa and cervix.  No abnormal discharge noted.  Pap smear obtained.  Normal uterine size, no other palpable masses, no uterine or adnexal tenderness. MUSCULOSKELETAL: Normal range of motion. No tenderness.  No cyanosis, clubbing, or edema.  2+ distal pulses.   Assessment:  Annual gynecologic examination with pap smear   Plan:  Will follow up results of pap smear and manage accordingly. Metrogel PRN BV Routine preventative health maintenance measures emphasized. Please refer to After Visit Summary for other counseling recommendations.    Chancy Milroy, MD, [place] Attending [place] for [facility], [place]

## 2022-01-01 NOTE — Patient Instructions (Signed)

## 2022-01-02 LAB — CERVICOVAGINAL ANCILLARY ONLY
Bacterial Vaginitis (gardnerella): NEGATIVE
Candida Glabrata: NEGATIVE
Candida Vaginitis: NEGATIVE
Chlamydia: NEGATIVE
Comment: NEGATIVE
Comment: NEGATIVE
Comment: NEGATIVE
Comment: NEGATIVE
Comment: NEGATIVE
Comment: NORMAL
Neisseria Gonorrhea: NEGATIVE
Trichomonas: NEGATIVE

## 2022-01-08 LAB — CYTOLOGY - PAP
Comment: NEGATIVE
Diagnosis: UNDETERMINED — AB
High risk HPV: NEGATIVE

## 2022-01-09 ENCOUNTER — Encounter: Payer: Self-pay | Admitting: Obstetrics and Gynecology

## 2022-01-09 DIAGNOSIS — R87610 Atypical squamous cells of undetermined significance on cytologic smear of cervix (ASC-US): Secondary | ICD-10-CM | POA: Insufficient documentation

## 2022-01-20 ENCOUNTER — Ambulatory Visit: Payer: PRIVATE HEALTH INSURANCE | Admitting: Emergency Medicine

## 2022-01-20 ENCOUNTER — Encounter: Payer: Self-pay | Admitting: Emergency Medicine

## 2022-01-20 VITALS — BP 136/80 | HR 85 | Temp 98.5°F | Ht 62.0 in | Wt 186.4 lb

## 2022-01-20 DIAGNOSIS — K5909 Other constipation: Secondary | ICD-10-CM | POA: Diagnosis not present

## 2022-01-20 DIAGNOSIS — F17200 Nicotine dependence, unspecified, uncomplicated: Secondary | ICD-10-CM | POA: Diagnosis not present

## 2022-01-20 DIAGNOSIS — G43709 Chronic migraine without aura, not intractable, without status migrainosus: Secondary | ICD-10-CM | POA: Diagnosis not present

## 2022-01-20 DIAGNOSIS — G4733 Obstructive sleep apnea (adult) (pediatric): Secondary | ICD-10-CM | POA: Diagnosis not present

## 2022-01-20 MED ORDER — UBRELVY 100 MG PO TABS: 100.0000 mg | ORAL_TABLET | Freq: Every day | ORAL | 1 refills | Status: DC | PRN

## 2022-01-20 NOTE — Assessment & Plan Note (Signed)
Cardiovascular and cancer risk associated with smoking discussed. Smoking cessation advice given. 

## 2022-01-20 NOTE — Assessment & Plan Note (Signed)
Stable.  On CPAP treatment. 

## 2022-01-20 NOTE — Assessment & Plan Note (Signed)
Advised to increase amount of fiber intake and stay well-hydrated. May use milk of magnesia Decrease use of tramadol

## 2022-01-20 NOTE — Assessment & Plan Note (Signed)
Active and affecting quality of life. May benefit from Ubrelvy 100 mg Needs neurology evaluation. Referral placed today.

## 2022-01-20 NOTE — Patient Instructions (Signed)
Migraine Headache ?A migraine headache is a very strong throbbing pain on one side or both sides of your head. This type of headache can also cause other symptoms. It can last from 4 hours to 3 days. Talk with your doctor about what things may bring on (trigger) this condition. ?What are the causes? ?The exact cause of this condition is not known. This condition may be triggered or caused by: ?Drinking alcohol. ?Smoking. ?Taking medicines, such as: ?Medicine used to treat chest pain (nitroglycerin). ?Birth control pills. ?Estrogen. ?Some blood pressure medicines. ?Eating or drinking certain products. ?Doing physical activity. ?Other things that may trigger a migraine headache include: ?Having a menstrual period. ?Pregnancy. ?Hunger. ?Stress. ?Not getting enough sleep or getting too much sleep. ?Weather changes. ?Tiredness (fatigue). ?What increases the risk? ?Being 25-55 years old. ?Being female. ?Having a family history of migraine headaches. ?Being Caucasian. ?Having depression or anxiety. ?Being very overweight. ?What are the signs or symptoms? ?A throbbing pain. This pain may: ?Happen in any area of the head, such as on one side or both sides. ?Make it hard to do daily activities. ?Get worse with physical activity. ?Get worse around bright lights or loud noises. ?Other symptoms may include: ?Feeling sick to your stomach (nauseous). ?Vomiting. ?Dizziness. ?Being sensitive to bright lights, loud noises, or smells. ?Before you get a migraine headache, you may get warning signs (an aura). An aura may include: ?Seeing flashing lights or having blind spots. ?Seeing bright spots, halos, or zigzag lines. ?Having tunnel vision or blurred vision. ?Having numbness or a tingling feeling. ?Having trouble talking. ?Having weak muscles. ?Some people have symptoms after a migraine headache (postdromal phase), such as: ?Tiredness. ?Trouble thinking (concentrating). ?How is this treated? ?Taking medicines that: ?Relieve  pain. ?Relieve the feeling of being sick to your stomach. ?Prevent migraine headaches. ?Treatment may also include: ?Having acupuncture. ?Avoiding foods that bring on migraine headaches. ?Learning ways to control your body functions (biofeedback). ?Therapy to help you know and deal with negative thoughts (cognitive behavioral therapy). ?Follow these instructions at home: ?Medicines ?Take over-the-counter and prescription medicines only as told by your doctor. ?Ask your doctor if the medicine prescribed to you: ?Requires you to avoid driving or using heavy machinery. ?Can cause trouble pooping (constipation). You may need to take these steps to prevent or treat trouble pooping: ?Drink enough fluid to keep your pee (urine) pale yellow. ?Take over-the-counter or prescription medicines. ?Eat foods that are high in fiber. These include beans, whole grains, and fresh fruits and vegetables. ?Limit foods that are high in fat and sugar. These include fried or sweet foods. ?Lifestyle ?Do not drink alcohol. ?Do not use any products that contain nicotine or tobacco, such as cigarettes, e-cigarettes, and chewing tobacco. If you need help quitting, ask your doctor. ?Get at least 8 hours of sleep every night. ?Limit and deal with stress. ?General instructions ? ?  ? ?Keep a journal to find out what may bring on your migraine headaches. For example, write down: ?What you eat and drink. ?How much sleep you get. ?Any change in what you eat or drink. ?Any change in your medicines. ?If you have a migraine headache: ?Avoid things that make your symptoms worse, such as bright lights. ?It may help to lie down in a dark, quiet room. ?Do not drive or use heavy machinery. ?Ask your doctor what activities are safe for you. ?Keep all follow-up visits as told by your doctor. This is important. ?Contact a doctor if: ?You get a migraine   headache that is different or worse than others you have had. ?You have more than 15 headache days in one  month. ?Get help right away if: ?Your migraine headache gets very bad. ?Your migraine headache lasts longer than 72 hours. ?You have a fever. ?You have a stiff neck. ?You have trouble seeing. ?Your muscles feel weak or like you cannot control them. ?You start to lose your balance a lot. ?You start to have trouble walking. ?You pass out (faint). ?You have a seizure. ?Summary ?A migraine headache is a very strong throbbing pain on one side or both sides of your head. These headaches can also cause other symptoms. ?This condition may be treated with medicines and changes to your lifestyle. ?Keep a journal to find out what may bring on your migraine headaches. ?Contact a doctor if you get a migraine headache that is different or worse than others you have had. ?Contact your doctor if you have more than 15 headache days in a month. ?This information is not intended to replace advice given to you by your health care provider. Make sure you discuss any questions you have with your health care provider. ?Document Revised: 07/09/2018 Document Reviewed: 04/29/2018 ?Elsevier Patient Education ? 2023 [employer] ? ?

## 2022-01-20 NOTE — Progress Notes (Signed)
Wendy Howard 51 y.o.   Chief Complaint  Patient presents with   Follow-up    F/u migraines, patient wants a referral to neurologist     HISTORY OF PRESENT ILLNESS: This is a 51 y.o. female with history of migraines here for follow-up plan referral to neurology. Overall doing well. Works as a Psychologist, sport and exercise. Smoking less. History of OSA on CPAP. Gets about 2-3 headaches per week.  Partially responsive to Excedrin Migraine.  HPI   Prior to Admission medications   Medication Sig Start Date End Date Taking? Authorizing Provider  aspirin-acetaminophen-caffeine (EXCEDRIN MIGRAINE) [phone] MG tablet Take 1 tablet by mouth daily as needed for headache or migraine.   Yes [provider]  cetirizine (EQ ALLERGY RELIEF, CETIRIZINE,) 10 MG tablet Take 1 tablet by mouth once daily 10/18/21  Yes Amarise Lillo, Ines Bloomer, MD  metroNIDAZOLE (METROGEL VAGINAL) 0.75 % vaginal gel Place 1 Applicatorful vaginally 2 (two) times daily. 01/01/22  Yes Chancy Milroy, MD  omeprazole (PRILOSEC) 40 MG capsule Take 1 capsule (40 mg total) by mouth daily. 08/16/20  Yes Shemekia Patane, Ines Bloomer, MD  traMADol (ULTRAM) 50 MG tablet Take 1 tablet (50 mg total) by mouth every 12 (twelve) hours as needed. 07/25/21  Yes Fern Asmar, Ines Bloomer, MD  guaiFENesin-codeine 100-10 MG/5ML syrup Take by mouth. 01/19/22   [provider]  predniSONE (STERAPRED UNI-PAK 21 TAB) 5 MG (21) TBPK tablet Take by mouth. 01/19/22   [provider]    Allergies  Allergen Reactions   Other Hives    Seafood-hives    Patient Active Problem List   Diagnosis Date Noted   ASCUS of cervix with negative high risk HPV 01/09/2022   Chronic tension-type headache, not intractable 07/25/2021   Chronic constipation 07/25/2021   History of migraine 07/23/2020   Severe obstructive sleep apnea-hypopnea syndrome 08/25/2019   Chronic intermittent hypoxia with obstructive sleep apnea 08/25/2019    Paroxysmal supraventricular tachycardia 09/08/2018   Narrow pharyngeal airway 09/08/2018   Current smoker 09/08/2018   Arthritis of wrist 03/22/2018   Ganglion cyst of finger of right hand 03/22/2018    Past Medical History:  Diagnosis Date   GERD (gastroesophageal reflux disease)    Migraine    Sleep apnea    cpap    Past Surgical History:  Procedure Laterality Date   DILATION AND EVACUATION N/A 05/02/2013   Procedure: SUCTION DILATATION AND EVACUATION;  Surgeon: Shelly Bombard, MD;  Location: [facility];  Service: Gynecology;  Laterality: N/A;   KNEE SURGERY     KNEE SURGERY     KNEE SURGERY      Social History   Socioeconomic History   Marital status: Married    Spouse name: Clifton James   Number of children: 0   Years of education: Not on file   Highest education level: Not on file  Occupational History   Occupation: Heritage manager   Tobacco Use   Smoking status: Every Day    Packs/day: 0.50    Types: Cigarettes    Last attempt to quit: 03/08/2020    Years since quitting: 1.8   Smokeless tobacco: Never  Vaping Use   Vaping Use: Never used  Substance and Sexual Activity   Alcohol use: Yes    Alcohol/week: 0.0 standard drinks of alcohol    Comment: occaisional    Drug use: No   Sexual activity: Yes    Partners: Male    Birth control/protection: None    Comment:  monagamous partner   Other Topics Concern   Not on file  Social History Narrative   She is married. Lives at home with husband.    Social Determinants of Health   Financial Resource Strain: Low Risk  (05/20/2017)   Overall Financial Resource Strain (CARDIA)    Difficulty of Paying Living Expenses: Not hard at all  Food Insecurity: No Food Insecurity (05/20/2017)   Hunger Vital Sign    Worried About Running Out of Food in the Last Year: Never true    Ran Out of Food in the Last Year: Never true  Transportation Needs: No Transportation Needs (05/20/2017)   PRAPARE -  Hydrologist (Medical): No    Lack of Transportation (Non-Medical): No  Physical Activity: Inactive (05/20/2017)   Exercise Vital Sign    Days of Exercise per Week: 0 days    Minutes of Exercise per Session: 0 min  Stress: No Stress Concern Present (05/20/2017)   [place]    Feeling of Stress : Only a little  Social Connections: Moderately Isolated (05/20/2017)   Social Connection and Isolation Panel [NHANES]    Frequency of Communication with Friends and Family: Twice a week    Frequency of Social Gatherings with Friends and Family: Never    Attends Religious Services: Never    Marine scientist or Organizations: No    Attends Archivist Meetings: Never    Marital Status: Married  Human resources officer Violence: Not At Risk (05/20/2017)   Humiliation, Afraid, Rape, and Kick questionnaire    Fear of Current or Ex-Partner: No    Emotionally Abused: No    Physically Abused: No    Sexually Abused: No    Family History  Problem Relation Age of Onset   Hypertension Father    Hypertension Sister    Hypertension Maternal Grandmother    Colon cancer Neg Hx    Colon polyps Neg Hx    Esophageal cancer Neg Hx    Rectal cancer Neg Hx    Stomach cancer Neg Hx      Review of Systems  Constitutional: Negative.  Negative for chills and fever.  HENT: Negative.  Negative for congestion and sore throat.   Respiratory: Negative.  Negative for cough and shortness of breath.   Cardiovascular: Negative.  Negative for chest pain and palpitations.  Gastrointestinal:  Negative for abdominal pain, nausea and vomiting.  Genitourinary: Negative.   Skin: Negative.  Negative for rash.  Neurological:  Positive for headaches.  All other systems reviewed and are negative.  Today's Vitals   01/20/22 0806  BP: 136/80  Pulse: 85  Temp: 98.5 F (36.9 C)  TempSrc: Oral  SpO2: 97%  Weight: 186 lb 6  oz (84.5 kg)  Height: '5\' 2"'$  (1.575 m)   Body mass index is 34.09 kg/m. Wt Readings from Last 3 Encounters:  01/20/22 186 lb 6 oz (84.5 kg)  01/01/22 187 lb (84.8 kg)  07/25/21 190 lb 2 oz (86.2 kg)     Physical Exam Vitals reviewed.  Constitutional:      Appearance: Normal appearance.  HENT:     Head: Normocephalic.  Eyes:     Extraocular Movements: Extraocular movements intact.     Pupils: Pupils are equal, round, and reactive to light.  Cardiovascular:     Rate and Rhythm: Normal rate and regular rhythm.     Pulses: Normal pulses.     Heart  sounds: Normal heart sounds.  Pulmonary:     Effort: Pulmonary effort is normal.     Breath sounds: Normal breath sounds.  Musculoskeletal:     Cervical back: No tenderness.  Lymphadenopathy:     Cervical: No cervical adenopathy.  Skin:    General: Skin is warm and dry.  Neurological:     General: No focal deficit present.     Mental Status: She is alert and oriented to person, place, and time.  Psychiatric:        Mood and Affect: Mood normal.        Behavior: Behavior normal.      ASSESSMENT & PLAN: A total of 43 minutes was spent with the patient and counseling/coordination of care regarding preparing for this visit, review of most recent office visit notes, review of most recent blood work results, review of multiple chronic medical problems and their management, review of all medications, diagnosis of migraine and need for neurology evaluation, education on nutrition, prognosis, documentation, and need for follow-up.  Problem List Items Addressed This Visit       Cardiovascular and Mediastinum   Chronic migraine w/o aura w/o status migrainosus, not intractable - Primary    Active and affecting quality of life. May benefit from Ubrelvy 100 mg Needs neurology evaluation. Referral placed today.      Relevant Medications   predniSONE (STERAPRED UNI-PAK 21 TAB) 5 MG (21) TBPK tablet   Ubrogepant (UBRELVY) 100 MG TABS    Other Relevant Orders   Ambulatory referral to Neurology     Respiratory   Severe obstructive sleep apnea-hypopnea syndrome    Stable.  On CPAP treatment.        Digestive   Chronic constipation    Advised to increase amount of fiber intake and stay well-hydrated. May use milk of magnesia Decrease use of tramadol        Other   Current smoker    Cardiovascular and cancer risk associated with smoking discussed.  Smoking cessation advice given.      Patient Instructions  Migraine Headache A migraine headache is a very strong throbbing pain on one side or both sides of your head. This type of headache can also cause other symptoms. It can last from 4 hours to 3 days. Talk with your doctor about what things may bring on (trigger) this condition. What are the causes? The exact cause of this condition is not known. This condition may be triggered or caused by: Drinking alcohol. Smoking. Taking medicines, such as: Medicine used to treat chest pain (nitroglycerin). Birth control pills. Estrogen. Some blood pressure medicines. Eating or drinking certain products. Doing physical activity. Other things that may trigger a migraine headache include: Having a menstrual period. Pregnancy. Hunger. Stress. Not getting enough sleep or getting too much sleep. Weather changes. Tiredness (fatigue). What increases the risk? Being 31-49 years old. Being female. Having a family history of migraine headaches. Being Caucasian. Having depression or anxiety. Being very overweight. What are the signs or symptoms? A throbbing pain. This pain may: Happen in any area of the head, such as on one side or both sides. Make it hard to do daily activities. Get worse with physical activity. Get worse around bright lights or loud noises. Other symptoms may include: Feeling sick to your stomach (nauseous). Vomiting. Dizziness. Being sensitive to bright lights, loud noises, or smells. Before  you get a migraine headache, you may get warning signs (an aura). An aura may include: Seeing flashing  lights or having blind spots. Seeing bright spots, halos, or zigzag lines. Having tunnel vision or blurred vision. Having numbness or a tingling feeling. Having trouble talking. Having weak muscles. Some people have symptoms after a migraine headache (postdromal phase), such as: Tiredness. Trouble thinking (concentrating). How is this treated? Taking medicines that: Relieve pain. Relieve the feeling of being sick to your stomach. Prevent migraine headaches. Treatment may also include: Having acupuncture. Avoiding foods that bring on migraine headaches. Learning ways to control your body functions (biofeedback). Therapy to help you know and deal with negative thoughts (cognitive behavioral therapy). Follow these instructions at home: Medicines Take over-the-counter and prescription medicines only as told by your doctor. Ask your doctor if the medicine prescribed to you: Requires you to avoid driving or using heavy machinery. Can cause trouble pooping (constipation). You may need to take these steps to prevent or treat trouble pooping: Drink enough fluid to keep your pee (urine) pale yellow. Take over-the-counter or prescription medicines. Eat foods that are high in fiber. These include beans, whole grains, and fresh fruits and vegetables. Limit foods that are high in fat and sugar. These include fried or sweet foods. Lifestyle Do not drink alcohol. Do not use any products that contain nicotine or tobacco, such as cigarettes, e-cigarettes, and chewing tobacco. If you need help quitting, ask your doctor. Get at least 8 hours of sleep every night. Limit and deal with stress. General instructions     Keep a journal to find out what may bring on your migraine headaches. For example, write down: What you eat and drink. How much sleep you get. Any change in what you eat or  drink. Any change in your medicines. If you have a migraine headache: Avoid things that make your symptoms worse, such as bright lights. It may help to lie down in a dark, quiet room. Do not drive or use heavy machinery. Ask your doctor what activities are safe for you. Keep all follow-up visits as told by your doctor. This is important. Contact a doctor if: You get a migraine headache that is different or worse than others you have had. You have more than 15 headache days in one month. Get help right away if: Your migraine headache gets very bad. Your migraine headache lasts longer than 72 hours. You have a fever. You have a stiff neck. You have trouble seeing. Your muscles feel weak or like you cannot control them. You start to lose your balance a lot. You start to have trouble walking. You pass out (faint). You have a seizure. Summary A migraine headache is a very strong throbbing pain on one side or both sides of your head. These headaches can also cause other symptoms. This condition may be treated with medicines and changes to your lifestyle. Keep a journal to find out what may bring on your migraine headaches. Contact a doctor if you get a migraine headache that is different or worse than others you have had. Contact your doctor if you have more than 15 headache days in a month. This information is not intended to replace advice given to you by your health care provider. Make sure you discuss any questions you have with your health care provider. Document Revised: 07/09/2018 Document Reviewed: 04/29/2018 Elsevier Patient Education  [place], MD [facility]

## 2022-01-21 ENCOUNTER — Telehealth: Payer: Self-pay | Admitting: *Deleted

## 2022-01-21 NOTE — Telephone Encounter (Signed)
PA for Wendy Howard denied, patient notified   Denied due to patient not trying two triptans (for example: eletriptan, rizatriptan, sumatriptan  Please advise

## 2022-01-21 NOTE — Telephone Encounter (Signed)
PA for [facility] submitted, awaiting response Key: [id number]

## 2022-01-23 ENCOUNTER — Other Ambulatory Visit: Payer: Self-pay | Admitting: Emergency Medicine

## 2022-01-23 ENCOUNTER — Encounter: Payer: Self-pay | Admitting: Emergency Medicine

## 2022-01-23 MED ORDER — SUMATRIPTAN SUCCINATE 100 MG PO TABS: ORAL_TABLET | ORAL | 1 refills | Status: DC

## 2022-01-23 NOTE — Telephone Encounter (Signed)
New prescription for Imitrex sent to pharmacy of record.  Thanks.

## 2022-02-07 ENCOUNTER — Encounter: Payer: Self-pay | Admitting: Emergency Medicine

## 2022-02-08 ENCOUNTER — Other Ambulatory Visit: Payer: Self-pay | Admitting: Emergency Medicine

## 2022-02-08 MED ORDER — UBRELVY 100 MG PO TABS: 100.0000 mg | ORAL_TABLET | Freq: Every day | ORAL | 1 refills | Status: DC | PRN

## 2022-02-08 NOTE — Telephone Encounter (Signed)
New prescription for Ubrelvy sent to pharmacy of record.  Thanks.

## 2022-02-13 ENCOUNTER — Encounter: Payer: Self-pay | Admitting: Emergency Medicine

## 2022-02-13 MED ORDER — OMEPRAZOLE 40 MG PO CPDR: 40.0000 mg | DELAYED_RELEASE_CAPSULE | Freq: Every day | ORAL | 3 refills | Status: DC

## 2022-02-17 ENCOUNTER — Encounter: Payer: Self-pay | Admitting: Emergency Medicine

## 2022-02-17 ENCOUNTER — Other Ambulatory Visit: Payer: Self-pay | Admitting: Emergency Medicine

## 2022-02-17 DIAGNOSIS — G44229 Chronic tension-type headache, not intractable: Secondary | ICD-10-CM

## 2022-02-17 MED ORDER — TRAMADOL HCL 50 MG PO TABS: 50.0000 mg | ORAL_TABLET | Freq: Two times a day (BID) | ORAL | 1 refills | Status: DC | PRN

## 2022-02-17 NOTE — Telephone Encounter (Signed)
New prescription sent to pharmacy of record.  Thanks.

## 2022-03-04 ENCOUNTER — Emergency Department (HOSPITAL_COMMUNITY)
Admission: EM | Admit: 2022-03-04 | Discharge: 2022-03-05 | Discharge status: Against Medical Advice | Payer: PRIVATE HEALTH INSURANCE | Attending: Emergency Medicine | Admitting: Emergency Medicine

## 2022-03-04 ENCOUNTER — Other Ambulatory Visit: Payer: Self-pay

## 2022-03-04 DIAGNOSIS — Z5321 Procedure and treatment not carried out due to patient leaving prior to being seen by health care provider: Secondary | ICD-10-CM | POA: Diagnosis not present

## 2022-03-04 DIAGNOSIS — M545 Low back pain, unspecified: Secondary | ICD-10-CM | POA: Diagnosis present

## 2022-03-04 NOTE — ED Triage Notes (Signed)
Pt c/o right lower back pain that goes down her right leg. She takes tramadol for pain, not helping tonight. Ambulatory with steady gait.

## 2022-03-05 ENCOUNTER — Telehealth: Payer: Self-pay | Admitting: *Deleted

## 2022-03-05 NOTE — Telephone Encounter (Signed)
PA for [facility] submitted, awaiting response  Key: [id number]

## 2022-03-05 NOTE — ED Notes (Signed)
Pt was called at 0941 and at this time w/o answer.  Due to the extended time between calls, it is assumed the Pt left.

## 2022-03-06 ENCOUNTER — Ambulatory Visit: Payer: PRIVATE HEALTH INSURANCE | Admitting: Neurology

## 2022-03-06 ENCOUNTER — Encounter: Payer: Self-pay | Admitting: Neurology

## 2022-03-06 VITALS — BP 135/89 | HR 88 | Ht 62.0 in | Wt 194.8 lb

## 2022-03-06 DIAGNOSIS — E559 Vitamin D deficiency, unspecified: Secondary | ICD-10-CM | POA: Diagnosis not present

## 2022-03-06 DIAGNOSIS — G43709 Chronic migraine without aura, not intractable, without status migrainosus: Secondary | ICD-10-CM

## 2022-03-06 DIAGNOSIS — G4733 Obstructive sleep apnea (adult) (pediatric): Secondary | ICD-10-CM

## 2022-03-06 DIAGNOSIS — R5383 Other fatigue: Secondary | ICD-10-CM | POA: Diagnosis not present

## 2022-03-06 DIAGNOSIS — G4700 Insomnia, unspecified: Secondary | ICD-10-CM | POA: Diagnosis not present

## 2022-03-06 MED ORDER — TOPIRAMATE 50 MG PO TABS: 50.0000 mg | ORAL_TABLET | Freq: Two times a day (BID) | ORAL | 6 refills | Status: DC

## 2022-03-06 MED ORDER — NORTRIPTYLINE HCL 10 MG PO CAPS: 10.0000 mg | ORAL_CAPSULE | Freq: Every day | ORAL | 3 refills | Status: DC

## 2022-03-06 NOTE — Progress Notes (Signed)
[facility]    Provider:  Dr Jaynee Eagles Requesting Provider: Horald Pollen, * Primary Care Provider:  Horald Pollen, MD  CC:  migraines  HPI:  Wendy Howard is a 51 y.o. female here as requested by Horald Pollen, * for migraines.  She has a past medical history of OSA on CPAP, chronic tension type headache non-intractable, migraines, chronic constipation, severe OSA, current smoker.  Reviewed Dr. Barry Brunner notes.  She has a history of migraines.  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  History of OSA on CPAP.  Gets about 2-3 headaches per week partially responsive to Excedrin Migraine.  Migraines are affecting her life, he suggested [place] and neurology referral.  She was also given a Medrol Dosepak at last appointment.  She was counseled on smoking.  I am going to request her CPAP compliance report.  It does not appear she has followed up with our sleep team for years she may not be compliant.  She saw Amy Lomax in 2021, sleep study was on October 10, 2018 showed severe obstructive sleep apnea with an AHI of 46.3 and exacerbated in rem sleep to 71.1 and supine 55.6.  At that time she was 100% compliant.  I do not see any brain imaging in epic.  I also reviewed "care everywhere" I do not see any brain imaging, and I do not see any notes in epic or in "Care Everywhere" from any prior neurology appointments.  She is compliant with cpap. She has terrible migraines, they can so severe she cries, it is unilateral, pulsating/pounding/throbbing, photo/phonophobia. Nausea, improvement hurts, a dark quiet room helps, imitrex(side effects).Going on for 4-5 years no changes in quality, severity, quantity. On average >> 8 migraine days a month and > 15 headache days a month. No aura. No medication overuse. They can last up to 24 hours and be severe. Not positional, not exertional, no vision changes, no focal neurologic deficits. Sister has migraines.  They started when she was pregnant. Ibuprofen did not help. Roselyn Meier helps a little. No other focal neurologic deficits, associated symptoms, inciting events or modifiable factors.  From a thorough review of records in epic, medications tried that can be used in migraines and headaches include Tylenol, aspirin, Decadron, ibuprofen, Toradol, Mobic, naproxen, oxycodone, prednisone taper pack, Zofran injections, Zoloft, Imitrex, tramadol, Ubrelvy,, excedrin, starting Topiramate   Reviewed compliance report and data  as well as sleep apnea report and data: severe sleep apnea. On cpap residual ahi 0.6  30/30 use 100% compliant  Reviewed notes, labs and imaging from outside physicians, which showed:  Recent Results (from the past 2160 hour(s))  Cytology - PAP( [place])     Status: Abnormal   Collection Time: 01/01/22  3:35 PM  Result Value Ref Range   High risk HPV Negative    Adequacy      Satisfactory for evaluation; transformation zone component PRESENT.   Diagnosis (A)     - Atypical squamous cells of undetermined significance (ASC-US)   Comment Normal Reference Range HPV - Negative   Cervicovaginal ancillary only( [place])     Status: None   Collection Time: 01/01/22  3:35 PM  Result Value Ref Range   Neisseria Gonorrhea Negative    Chlamydia Negative    Trichomonas Negative    Bacterial Vaginitis (gardnerella) Negative    Candida Vaginitis Negative    Candida Glabrata Negative    Comment      Normal Reference Range Bacterial Vaginosis -  Negative   Comment Normal Reference Range Candida Species - Negative    Comment Normal Reference Range Candida Galbrata - Negative    Comment Normal Reference Range Trichomonas - Negative    Comment Normal Reference Ranger Chlamydia - Negative    Comment      Normal Reference Range Neisseria Gonorrhea - Negative       Latest Ref Rng & Units 07/25/2021    2:40 PM 07/23/2020   11:28 AM 09/01/2019   10:07 AM  CMP  Glucose 70 - 99 mg/dL 99   86  92   BUN 6 - 23 mg/dL '13  13  14   '$ Creatinine 0.40 - 1.20 mg/dL 0.74  0.79  0.78   Sodium 135 - 145 mEq/L 136  138  137   Potassium 3.5 - 5.1 mEq/L 3.7  3.9  4.3   Chloride 96 - 112 mEq/L 104  105  103   CO2 19 - 32 mEq/L '26  27  22   '$ Calcium 8.4 - 10.5 mg/dL 8.7  8.7  8.8   Total Protein 6.0 - 8.3 g/dL 7.0  7.0  6.7   Total Bilirubin 0.2 - 1.2 mg/dL 0.3  0.4  0.4   Alkaline Phos 39 - 117 U/L 91  82  86   AST 0 - 37 U/L '13  11  9   '$ ALT 0 - 35 U/L '14  9  8        '$ Latest Ref Rng & Units 07/25/2021    2:40 PM 07/23/2020   11:28 AM 09/01/2019   10:07 AM  CBC  WBC 4.0 - 10.5 K/uL 8.2  7.3  7.8   Hemoglobin 12.0 - 15.0 g/dL 12.9  12.8  13.0   Hematocrit 36.0 - 46.0 % 40.1  39.6  40.6   Platelets 150.0 - 400.0 K/uL 252.0  239.0  245     Review of Systems: Patient complains of symptoms per HPI as well as the following symptoms fatigue. Pertinent negatives and positives per HPI. All others negative.   Social History   Socioeconomic History   Marital status: Married    Spouse name: Clifton James   Number of children: 0   Years of education: Not on file   Highest education level: Not on file  Occupational History   Occupation: Heritage manager   Tobacco Use   Smoking status: Every Day    Packs/day: 0.50    Types: Cigarettes    Last attempt to quit: 03/08/2020    Years since quitting: 1.9   Smokeless tobacco: Never  Vaping Use   Vaping Use: Never used  Substance and Sexual Activity   Alcohol use: Yes    Alcohol/week: 0.0 standard drinks of alcohol    Comment: occaisional    Drug use: No   Sexual activity: Yes    Partners: Male    Birth control/protection: None    Comment: monagamous partner   Other Topics Concern   Not on file  Social History Narrative   She is married. Lives at home with husband.   Right Handed   1 Cup of Coffee per Day    Social Determinants of Health   Financial Resource Strain: Low Risk  (05/20/2017)   Overall Financial  Resource Strain (CARDIA)    Difficulty of Paying Living Expenses: Not hard at all  Food Insecurity: No Food Insecurity (05/20/2017)   Hunger Vital Sign    Worried About Charity fundraiser in  the Last Year: Never true    [place] in the Last Year: Never true  Transportation Needs: No Transportation Needs (05/20/2017)   PRAPARE - Hydrologist (Medical): No    Lack of Transportation (Non-Medical): No  Physical Activity: Inactive (05/20/2017)   Exercise Vital Sign    Days of Exercise per Week: 0 days    Minutes of Exercise per Session: 0 min  Stress: No Stress Concern Present (05/20/2017)   [place]    Feeling of Stress : Only a little  Social Connections: Moderately Isolated (05/20/2017)   Social Connection and Isolation Panel [NHANES]    Frequency of Communication with Friends and Family: Twice a week    Frequency of Social Gatherings with Friends and Family: Never    Attends Religious Services: Never    Marine scientist or Organizations: No    Attends Archivist Meetings: Never    Marital Status: Married  Human resources officer Violence: Not At Risk (05/20/2017)   Humiliation, Afraid, Rape, and Kick questionnaire    Fear of Current or Ex-Partner: No    Emotionally Abused: No    Physically Abused: No    Sexually Abused: No    Family History  Problem Relation Age of Onset   Hypertension Father    Hypertension Sister    Hypertension Maternal Grandmother    Colon cancer Neg Hx    Colon polyps Neg Hx    Esophageal cancer Neg Hx    Rectal cancer Neg Hx    Stomach cancer Neg Hx     Past Medical History:  Diagnosis Date   GERD (gastroesophageal reflux disease)    Migraine    Sleep apnea    cpap    Patient Active Problem List   Diagnosis Date Noted   OSA on CPAP 03/06/2022   Chronic migraine without aura without status migrainosus, not intractable 01/20/2022    ASCUS of cervix with negative high risk HPV 01/09/2022   Chronic tension-type headache, not intractable 07/25/2021   Chronic constipation 07/25/2021   History of migraine 07/23/2020   Severe obstructive sleep apnea-hypopnea syndrome 08/25/2019   Chronic intermittent hypoxia with obstructive sleep apnea 08/25/2019   Paroxysmal supraventricular tachycardia 09/08/2018   Other fatigue 09/08/2018   Narrow pharyngeal airway 09/08/2018   Current smoker 09/08/2018   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     cetirizine (EQ ALLERGY RELIEF, CETIRIZINE,) 10 MG tablet Take 1 tablet by mouth once daily 90 tablet 3   metroNIDAZOLE (METROGEL VAGINAL) 0.75 % vaginal gel Place 1 Applicatorful vaginally 2 (two) times daily. 70 g 3   nortriptyline (PAMELOR) 10 MG capsule Take 1 capsule (10 mg total) by mouth at bedtime. 90 capsule 3   omeprazole (PRILOSEC) 40 MG capsule Take 1 capsule (40 mg total) by mouth daily. 30 capsule 3   topiramate (TOPAMAX) 50  MG tablet Take 1 tablet (50 mg total) by mouth 2 (two) times daily. Start with one pill at bedtime. In 2 weeks start one pill twice daily. If it makes you sleepy can take them both together at bedtime 60 tablet 6   traMADol (ULTRAM) 50 MG tablet Take 1 tablet (50 mg total) by mouth every 12 (twelve) hours as needed. 30 tablet 1   Ubrogepant (UBRELVY) 100 MG TABS Take 100 mg by mouth daily as needed. 30 tablet 1   No current  facility-administered medications for this visit.    Allergies as of 03/06/2022 - Review Complete 03/06/2022  Allergen Reaction Noted   Other Hives 03/30/2012    Vitals: BP 135/89 (BP Location: Right Arm, Patient Position: Sitting, Cuff Size: Normal)   Pulse 88   Ht '5\' 2"'$  (1.575 m)   Wt 194 lb 12.8 oz (88.4 kg)   BMI 35.63 kg/m  Last Weight:  Wt Readings from Last 1 Encounters:  03/06/22 194 lb 12.8 oz (88.4 kg)   Last Height:   Ht Readings from Last 1 Encounters:  03/06/22 '5\' 2"'$  (1.575 m)     Physical exam: Exam: Gen: NAD, conversant, well nourised, obese, well groomed                     CV: RRR, no MRG. No Carotid Bruits. No peripheral edema, warm, nontender Eyes: Conjunctivae clear without exudates or hemorrhage  Neuro: Detailed Neurologic Exam  Speech:    Speech is normal; fluent and spontaneous with normal comprehension.  Cognition:    The patient is oriented to person, place, and time;     recent and remote memory intact;     language fluent;     normal attention, concentration,     fund of knowledge Cranial Nerves:    The pupils are equal, round, and reactive to light. The fundi are normal and spontaneous venous pulsations are present. Visual fields are full to finger confrontation. Extraocular movements are intact. Trigeminal sensation is intact and the muscles of mastication are normal. The face is symmetric. The palate elevates in the midline. Hearing intact. Voice is normal. Shoulder shrug is normal. The tongue has normal motion without fasciculations.   Coordination:    Normal finger to nose and heel to shin. Normal rapid alternating movements.   Gait:    Heel-toe and tandem gait are normal.   Motor Observation:    No asymmetry, no atrophy, and no involuntary movements noted. Tone:    Normal muscle tone.    Posture:    Posture is normal. normal erect    Strength:    Strength is V/V in the upper and lower limbs.      Sensation: intact to LT      Reflex Exam:  DTR's:    Deep tendon reflexes in the upper and lower extremities are normal bilaterally.   Toes:    The toes are downgoing bilaterally.   Clonus:    Clonus is absent.    Assessment/Plan:  patient with chronic migraines. No red flags for imaging and she declines, low threshold for mri brainin future. 100% compliance with cpap for her severe sleep apnea/ discussed weight loss as well.   Chronic migraines:  There are 3 meds to try prior to being able to get the newer great med called Ajovy and Emgality (I don;t like Aimovig bc of constipation): Topiramate, Propranolol and amitriptyline/nortriptyline.  Continue to take Roselyn Meier as needed  We have another medication called  Nurtec and may be able to be approved for that every other day for prevention. Also used as needed like the ubrelvy.   Plan: Try: Topiramate then propranolol then nortriptyline. If none work or side effects try Ajovy or Emgality once monthly new meds. Also have option for botox for migraines. Aimovig contraindicated due to constipation. Continue Ubrelvy prn.   Nortriptyline at bedtime. May also help with sleeping.  Orders Placed This Encounter  Procedures   TSH Rfx on Abnormal to Free T4   Vitamin D, 25-hydroxy   Meds ordered this encounter  Medications   topiramate (TOPAMAX) 50 MG tablet    Sig: Take 1 tablet (50 mg total) by mouth 2 (two) times daily. Start with one pill at bedtime. In 2 weeks start one pill twice daily. If it makes you sleepy can take them both together at bedtime    Dispense:  60 tablet    Refill:  6   nortriptyline (PAMELOR) 10 MG capsule    Sig: Take 1 capsule (10 mg total) by mouth at bedtime.    Dispense:  90 capsule    Refill:  3   Discussed: To prevent or relieve headaches, try the following: Cool Compress. Lie down and place a cool compress on your head.  Avoid headache triggers. If certain foods or odors seem to have triggered your migraines in the past, avoid them.  A headache diary might help you identify triggers.  Include physical activity in your daily routine. Try a daily walk or other moderate aerobic exercise.  Manage stress. Find healthy ways to cope with the stressors, such as delegating tasks on your to-do list.  Practice relaxation techniques. Try deep breathing, yoga, massage and visualization.  Eat regularly. Eating regularly scheduled meals and maintaining a healthy diet might help prevent headaches. Also, drink plenty of fluids.  Follow a regular sleep schedule. Sleep deprivation might contribute to headaches Consider biofeedback. With this mind-body technique, you learn to control certain bodily functions -- such as muscle tension, heart rate and blood pressure -- to prevent headaches or reduce headache pain.    Proceed to emergency room if you experience new or worsening symptoms or symptoms do not resolve, if you have new neurologic symptoms or if headache is severe, or for any concerning symptom.   Provided education and documentation from American headache Society toolbox including articles on: chronic migraine medication overuse headache, chronic migraines, prevention of migraines, behavioral and other nonpharmacologic treatments for headache.   Cc: Horald Pollen, *,  [place], [place], [place]  Sarina Ill, MD  [facility] [address] [place], [place] [address]  Phone [phone] Fax [phone]  I spent over 60 minutes of face-to-face and non-face-to-face time with patient on the  1. Chronic migraine without aura without status migrainosus, not intractable   2. Other fatigue   3. Vitamin D deficiency   4. Insomnia, unspecified type   5. OSA on CPAP    diagnosis.  This included previsit chart review, lab review, study review, order entry, electronic health record documentation, patient education on the different diagnostic and therapeutic options, counseling and coordination of  care, risks and benefits of management, compliance, or risk factor reduction

## 2022-03-06 NOTE — Patient Instructions (Addendum)
There are 3 meds to try prior to being able to get the newer great med called Ajovy and Emgality (I don;t like Aimovig bc of constipation): Topiramate, Propraolol and amitriptyline/nortriptyline.  Continue to take Roselyn Meier as needed  We have another medication called Nurtec and may be able to be approved for that every other day for prevention. Also used as needed like the ubrelvy.   Plan: Try: Topiramate then propranolol then nortriptyline. If none work or side effects try Ajovy or Emgality once monthly new meds. Also have option for botox for migraines.  Nortriptyline at bedtime. May also help with sleeping.  Nortriptyline Capsules What is this medication? NORTRIPTYLINE (nor TRIP ti leen) treats depression. It increases the amount of serotonin and norepinephrine in the brain, substances that help regulate mood. It belongs to a group of medications called tricyclic antidepressants (TCAs). This medicine may be used for other purposes; ask your health care provider or pharmacist if you have questions. COMMON BRAND NAME(S): Aventyl, Pamelor What should I tell my care team before I take this medication? They need to know if you have any of these conditions: Bipolar disorder Brugada syndrome Difficulty passing urine Glaucoma Heart disease If you drink alcohol Liver disease Schizophrenia Seizures Suicidal thoughts, plans or attempt; a previous suicide attempt by you or a family member Thyroid disease An unusual or allergic reaction to nortriptyline, other tricyclic antidepressants, other medications, foods, dyes, or preservatives Pregnant or trying to get pregnant Breast-feeding How should I use this medication? Take this medication by mouth with a glass of water. Follow the directions on the prescription label. Take your doses at regular intervals. Do not take it more often than directed. Do not stop taking this medication suddenly except upon the advice of your care team. Stopping this  medication too quickly may cause serious side effects or your condition may worsen. A special MedGuide will be given to you by the pharmacist with each prescription and refill. Be sure to read this information carefully each time. Talk to your care team about the use of this medication in children. Special care may be needed. Overdosage: If you think you have taken too much of this medicine contact a poison control center or emergency room at once. NOTE: This medicine is only for you. Do not share this medicine with others. What if I miss a dose? If you miss a dose, take it as soon as you can. If it is almost time for your next dose, take only that dose. Do not take double or extra doses. What may interact with this medication? Do not take this medication with any of the following: Cisapride Dronedarone Linezolid MAOIs, such as Marplan, Nardil, and Parnate Methylene blue (injected into a vein) Pimozide Thioridazine This medication may also interact with the following: Alcohol Antihistamines for allergy, cough, and cold Atropine Buspirone Certain medications for bladder problems, such as oxybutynin or tolterodine Certain medications for depression, such as amitriptyline, fluoxetine, sertraline Certain medications for headaches, such as sumatriptan or rizatriptan Certain medications for Parkinson disease, such as benztropine or trihexyphenidyl Certain medications for stomach problems, such as dicyclomine or hyoscyamine Certain medications for travel sickness, such as scopolamine Chlorpropamide Cimetidine Fentanyl Ipratropium Lithium Other medications that cause heart rhythm changes, such as dofetilide Quinidine Reserpine St. John's wort Thyroid medication Tramadol Tryptophan This list may not describe all possible interactions. Give your health care provider a list of all the medicines, herbs, non-prescription drugs, or dietary supplements you use. Also tell them if you smoke,  drink alcohol, or use illegal drugs. Some items may interact with your medicine. What should I watch for while using this medication? Tell your care team if your symptoms do not get better or if they get worse. Visit your care team for regular checks on your progress. Because it may take several weeks to see the full effects of this medication, it is important to continue your treatment as prescribed by your care team. Patients and their families should watch out for new or worsening thoughts of suicide or depression. Also watch out for sudden changes in feelings such as feeling anxious, agitated, panicky, irritable, hostile, aggressive, impulsive, severely restless, overly excited and hyperactive, or not being able to sleep. If this happens, especially at the beginning of treatment or after a change in dose, call your care team. You may get drowsy or dizzy. Do not drive, use machinery, or do anything that needs mental alertness until you know how this medication affects you. Do not stand or sit up quickly, especially if you are an older patient. This reduces the risk of dizzy or fainting spells. Alcohol may interfere with the effect of this medication. Avoid alcoholic drinks. Do not treat yourself for coughs, colds, or allergies without asking your care team for advice. Some ingredients can increase possible side effects. Your mouth may get dry. Chewing sugarless gum or sucking hard candy, and drinking plenty of water may help. Contact your care team if the problem does not go away or is severe. This medication may cause dry eyes and blurred vision. If you wear contact lenses you may feel some discomfort. Lubricating drops may help. See your eye doctor if the problem does not go away or is severe. This medication can cause constipation. Try to have a bowel movement at least every 2 to 3 days. If you do not have a bowel movement for 3 days, call your care team. This medication can make you more sensitive to  the sun. Keep out of the sun. If you cannot avoid being in the sun, wear protective clothing and use sunscreen. Do not use sun lamps or tanning beds/booths. What side effects may I notice from receiving this medication? Side effects that you should report to your care team as soon as possible: Allergic reactions--skin rash, itching, hives, swelling of the face, lips, tongue, or throat Heart rhythm changes--fast or irregular heartbeat, dizziness, feeling faint or lightheaded, chest pain, trouble breathing Irritability, confusion, fast or irregular heartbeat, muscle stiffness, twitching muscles, sweating, high fever, seizure, chills, vomiting, diarrhea, which may be signs of serotonin syndrome Seizures Sudden eye pain or change in vision such as blurry vision, seeing halos around lights, vision loss Thoughts of suicide or self-harm, worsening mood, feelings of depression Trouble passing urine Side effects that usually do not require medical attention (report to your care team if they continue or are bothersome): Change in sex drive or performance Constipation Dizziness Drowsiness Dry mouth Tremors or shaking This list may not describe all possible side effects. Call your doctor for medical advice about side effects. You may report side effects to FDA at 1-800-FDA-1088. Where should I keep my medication? Keep out of the reach of children. Store at room temperature between 15 and 30 degrees C (59 and 86 degrees F). Keep container tightly closed. Throw away any unused medication after the expiration date. NOTE: This sheet is a summary. It may not cover all possible information. If you have questions about this medicine, talk to your doctor, pharmacist, or  health care provider.  2023 Elsevier/Gold Standard (2020-12-06 00:00:00)   Analgesic Rebound Headache An analgesic rebound headache, sometimes called a medication overuse headache or a drug-induced headache, is a secondary disorder that is  caused by the overuse of pain medicine (analgesic) to treat the original (primary) headache. Any type of primary headache can return as a rebound headache if a person regularly takes analgesics. The types of primary headaches that are commonly associated with rebound headaches include: Migraines. Headaches that are caused by tense muscles in the head and neck area (tension headaches). Headaches that develop and happen again on one side of the head and around the eye (cluster headaches). If rebound headaches continue, they can become long-term, daily headaches. What are the causes? This condition may be caused by frequent use of: Over-the-counter medicines such as aspirin, ibuprofen, and acetaminophen. Sinus-relief medicines and medicines that contain caffeine. Narcotic pain medicines such as codeine and oxycodone. Some prescription migraine medicines. What are the signs or symptoms? The symptoms of a rebound headache are the same as the symptoms of the original headache. Some of the symptoms of specific types of headaches include: Migraine headache Pulsing or throbbing pain on one or both sides of the head. Severe pain that interferes with daily activities. Pain that gets worse with physical activity. Nausea, vomiting, or both. Pain and sensitivity with exposure to bright light, loud noises, or strong smells. Visual changes. Numbness of one or both arms. Tension headache Pressure around the head. Dull, aching head pain. Pain felt over the front and sides of the head. Tenderness in the muscles of the head, neck, and shoulders. Cluster headache Severe pain that begins in or around one eye or temple. Droopy or swollen eyelid, or redness and tearing in the eye on the same side as the pain. One-sided head pain. Nausea. Runny nose. Sweaty, pale facial skin. Restlessness. How is this diagnosed? This condition is diagnosed by: Reviewing your medical history. This includes the nature of  your primary headaches. Reviewing the types of pain medicines that you have been using to treat your primary headaches and how often you take them. How is this treated? This condition may be treated or managed by: Discontinuing frequent use of the analgesic medicine. Doing this may worsen your headaches at first, but the pain should eventually become more manageable, less frequent, and less severe. Seeing a headache specialist. He or she may be able to help you manage your headaches and help make sure there is not another cause of the headaches. Using methods of stress relief, such as acupuncture, counseling, biofeedback, and massage. Talk with your health care provider about which methods might be good for you. Follow these instructions at home: Medicines  Take over-the-counter and prescription medicines only as told by your health care provider. Stop the repeated use of pain medicine as told by your health care provider. Stopping can be difficult. Carefully follow instructions from your health care provider. Lifestyle Follow a regular sleep schedule. Do not vary the time that you go to bed or the amount that you sleep from day to day. It is important to stay on the same schedule to help prevent headaches. Get 7-9 hours of sleep each night, or the amount recommended by your health care provider. Exercise regularly. Exercise for at least 30 minutes, 5 times each week. Limit or manage stress. Consider stress-relief options such as acupuncture, counseling, biofeedback, and massage. Talk with your health care provider about which methods might be good for you. Do  not drink alcohol. Do not use any products that contain nicotine or tobacco, such as cigarettes, e-cigarettes, and chewing tobacco. If you need help quitting, ask your health care provider. General instructions Avoid triggers that are known to cause your primary headaches. Keep all follow-up visits as told by your health care provider. This  is important. Contact a health care provider if: You continue to experience headaches after following treatments that your health care provider recommended. Get help right away if you have: New headache pain. Headache pain that is different than what you have experienced in the past. Numbness or tingling in your arms or legs. Changes in your speech or vision. Summary An analgesic rebound headache, sometimes called a medication overuse headache or a drug-induced headache, is caused by the overuse of pain medicine (analgesic) to treat the original (primary) headache. Any type of primary headache can return as a rebound headache if a person regularly takes analgesics. The types of primary headaches that are commonly associated with rebound headaches include migraines, tension headaches, and cluster headaches. Analgesic rebound headaches can occur with frequent use of over-the-counter medicines and prescription medicines. Treatment involves stopping the medicine that is being overused. This will improve headache frequency and severity. This information is not intended to replace advice given to you by your health care provider. Make sure you discuss any questions you have with your health care provider. Document Revised: 08/29/2021 Document Reviewed: 04/14/2019 Elsevier Patient Education  [place].  Topiramate Tablets What is this medication? TOPIRAMATE (toe PYRE a mate) prevents and controls seizures in people with epilepsy. It may also be used to prevent migraine headaches. It works by calming overactive nerves in your body. This medicine may be used for other purposes; ask your health care provider or pharmacist if you have questions. COMMON BRAND NAME(S): Topamax, Topiragen What should I tell my care team before I take this medication? They need to know if you have any of these conditions: Bleeding disorder Kidney disease Lung disease Suicidal thoughts, plans, or attempt by you  or a family member An unusual or allergic reaction to topiramate, other medications, foods, dyes, or preservatives Pregnant or trying to get pregnant Breast-feeding How should I use this medication? Take this medication by mouth with water. Take it as directed on the prescription label at the same time every day. Do not cut, crush or chew this medicine. Swallow the tablets whole. You can take it with or without food. If it upsets your stomach, take it with food. Keep taking it unless your care team tells you to stop. A special MedGuide will be given to you by the pharmacist with each prescription and refill. Be sure to read this information carefully each time. Talk to your care team about the use of this medication in children. While it may be prescribed for children as young as 2 years for selected conditions, precautions do apply. Overdosage: If you think you have taken too much of this medicine contact a poison control center or emergency room at once. NOTE: This medicine is only for you. Do not share this medicine with others. What if I miss a dose? If you miss a dose, take it as soon as you can unless it is within 6 hours of the next dose. If it is within 6 hours of the next dose, skip the missed dose. Take the next dose at the normal time. Do not take double or extra doses. What may interact with this medication? Acetazolamide Alcohol Antihistamines  for allergy, cough, and cold Aspirin and aspirin-like medications Atropine Certain medications for anxiety or sleep Certain medications for bladder problems, such as oxybutynin, tolterodine Certain medications for depression, such as amitriptyline, fluoxetine, sertraline Certain medications for Parkinson disease, such as benztropine, trihexyphenidyl Certain medications for seizures, such as carbamazepine, lamotrigine, phenobarbital, phenytoin, primidone, valproic acid, zonisamide Certain medications for stomach problems, such as dicyclomine,  hyoscyamine Certain medications for travel sickness, such as scopolamine Certain medications that treat or prevent blood clots, such as warfarin, enoxaparin, dalteparin, apixaban, dabigatran, rivaroxaban Digoxin Diltiazem Estrogen and progestin hormones General anesthetics, such as halothane, isoflurane, methoxyflurane, propofol Glyburide Hydrochlorothiazide Ipratropium Lithium Medications that relax muscles Metformin NSAIDs, medications for pain and inflammation, such as ibuprofen or naproxen Opioid medications for pain Phenothiazines, such as chlorpromazine, mesoridazine, prochlorperazine, thioridazine Pioglitazone This list may not describe all possible interactions. Give your health care provider a list of all the medicines, herbs, non-prescription drugs, or dietary supplements you use. Also tell them if you smoke, drink alcohol, or use illegal drugs. Some items may interact with your medicine. What should I watch for while using this medication? Visit your care team for regular checks on your progress. Tell your care team if your symptoms do not start to get better or if they get worse. Do not suddenly stop taking this medication. You may develop a severe reaction. Your care team will tell you how much medication to take. If your care team wants you to stop the medication, the dose may be slowly lowered over time to avoid any side effects. Wear a medical ID bracelet or chain. Carry a card that describes your condition. List the medications and doses you take on the card. This medication may affect your coordination, reaction time, or judgment. Do not drive or operate machinery until you know how this medication affects you. Sit up or stand slowly to reduce the risk of dizzy or fainting spells. Drinking alcohol with this medication can increase the risk of these side effects. This medication may cause serious skin reactions. They can happen weeks to months after starting the medication.  Contact your care team right away if you notice fevers or flu-like symptoms with a rash. The rash may be red or purple and then turn into blisters or peeling of the skin. You may also notice a red rash with swelling of the face, lips, or lymph nodes in your neck or under your arms. This medication may cause thoughts of suicide or depression. This includes sudden changes in mood, behaviors, or thoughts. These changes can happen at any time but are more common in the beginning of treatment or after a change in dose. Call your care team right away if you experience these thoughts or worsening depression. This medication may slow your child's growth if it is taken for a long time at high doses. Your child's care team will monitor your child's growth. Using this medication for a long time may weaken your bones. The risk of bone fractures may be increased. Talk to your care team about your bone health. Discuss this medication with your care team if you may be pregnant. Serious birth defects can occur if you take this medication during pregnancy. There are benefits and risks to taking medications during pregnancy. Your care team can help you find the option that works for you. Contraception is recommended while taking this medication. Estrogen and progestin hormones may not work as well while you are taking this medication. Your care team can help you  find the option that works for you. Talk to your care team before breastfeeding. Changes to your treatment plan may be needed. What side effects may I notice from receiving this medication? Side effects that you should report to your care team as soon as possible: Allergic reactions--skin rash, itching, hives, swelling of the face, lips, tongue, or throat High acid level--trouble breathing, unusual weakness or fatigue, confusion, headache, fast or irregular heartbeat, nausea, vomiting High ammonia level--unusual weakness or fatigue, confusion, loss of appetite,  nausea, vomiting, seizures Fever that does not go away, decrease in sweat Kidney stones--blood in the urine, pain or trouble passing urine, pain in the lower back or sides Redness, blistering, peeling or loosening of the skin, including inside the mouth Sudden eye pain or change in vision such as blurry vision, seeing halos around lights, vision loss Thoughts of suicide or self-harm, worsening mood, feelings of depression Side effects that usually do not require medical attention (report to your care team if they continue or are bothersome): Burning or tingling sensation in hands or feet Difficulty with paying attention, memory, or speech Dizziness Drowsiness Fatigue Loss of appetite with weight loss Slow or sluggish movements of the body This list may not describe all possible side effects. Call your doctor for medical advice about side effects. You may report side effects to FDA at 1-800-FDA-1088. Where should I keep my medication? Keep out of the reach of children and pets. Store between 15 and 30 degrees C (59 and 86 degrees F). Protect from moisture. Keep the container tightly closed. Get rid of any unused medication after the expiration date. To get rid of medications that are no longer needed or have expired: Take the medication to a medication take-back program. Check with your pharmacy or law enforcement to find a location. If you cannot return the medication, check the label or package insert to see if the medication should be thrown out in the garbage or flushed down the toilet. If you are not sure, ask your care team. If it is safe to put it in the trash, empty the medication out of the container. Mix the medication with cat litter, dirt, coffee grounds, or other unwanted substance. Seal the mixture in a bag or container. Put it in the trash. NOTE: This sheet is a summary. It may not cover all possible information. If you have questions about this medicine, talk to your doctor,  pharmacist, or health care provider.  2023 Elsevier/Gold Standard (2020-06-11 00:00:00) Migraine Headache A migraine headache is an intense, throbbing pain on one side or both sides of the head. Migraine headaches may also cause other symptoms, such as nausea, vomiting, and sensitivity to light and noise. A migraine headache can last from 4 hours to 3 days. Talk with your doctor about what things may bring on (trigger) your migraine headaches. What are the causes? The exact cause of this condition is not known. However, a migraine may be caused when nerves in the brain become irritated and release chemicals that cause inflammation of blood vessels. This inflammation causes pain. This condition may be triggered or caused by: Drinking alcohol. Smoking. Taking medicines, such as: Medicine used to treat chest pain (nitroglycerin). Birth control pills. Estrogen. Certain blood pressure medicines. Eating or drinking products that contain nitrates, glutamate, aspartame, or tyramine. Aged cheeses, chocolate, or caffeine may also be triggers. Doing physical activity. Other things that may trigger a migraine headache include: Menstruation. Pregnancy. Hunger. Stress. Lack of sleep or too much sleep. Weather  changes. Fatigue. What increases the risk? The following factors may make you more likely to experience migraine headaches: Being a certain age. This condition is more common in people who are 10-35 years old. Being female. Having a family history of migraine headaches. Being Caucasian. Having a mental health condition, such as depression or anxiety. Being obese. What are the signs or symptoms? The main symptom of this condition is pulsating or throbbing pain. This pain may: Happen in any area of the head, such as on one side or both sides. Interfere with daily activities. Get worse with physical activity. Get worse with exposure to bright lights or loud noises. Other symptoms may  include: Nausea. Vomiting. Dizziness. General sensitivity to bright lights, loud noises, or smells. Before you get a migraine headache, you may get warning signs (an aura). An aura may include: Seeing flashing lights or having blind spots. Seeing bright spots, halos, or zigzag lines. Having tunnel vision or blurred vision. Having numbness or a tingling feeling. Having trouble talking. Having muscle weakness. Some people have symptoms after a migraine headache (postdromal phase), such as: Feeling tired. Difficulty concentrating. How is this diagnosed? A migraine headache can be diagnosed based on: Your symptoms. A physical exam. Tests, such as: CT scan or an MRI of the head. These imaging tests can help rule out other causes of headaches. Taking fluid from the spine (lumbar puncture) and analyzing it (cerebrospinal fluid analysis, or CSF analysis). How is this treated? This condition may be treated with medicines that: Relieve pain. Relieve nausea. Prevent migraine headaches. Treatment for this condition may also include: Acupuncture. Lifestyle changes like avoiding foods that trigger migraine headaches. Biofeedback. Cognitive behavioral therapy. Follow these instructions at home: Medicines Take over-the-counter and prescription medicines only as told by your health care provider. Ask your health care provider if the medicine prescribed to you: Requires you to avoid driving or using heavy machinery. Can cause constipation. You may need to take these actions to prevent or treat constipation: Drink enough fluid to keep your urine pale yellow. Take over-the-counter or prescription medicines. Eat foods that are high in fiber, such as beans, whole grains, and fresh fruits and vegetables. Limit foods that are high in fat and processed sugars, such as fried or sweet foods. Lifestyle Do not drink alcohol. Do not use any products that contain nicotine or tobacco, such as cigarettes,  e-cigarettes, and chewing tobacco. If you need help quitting, ask your health care provider. Get at least 8 hours of sleep every night. Find ways to manage stress, such as meditation, deep breathing, or yoga. General instructions Keep a journal to find out what may trigger your migraine headaches. For example, write down: What you eat and drink. How much sleep you get. Any change to your diet or medicines. If you have a migraine headache: Avoid things that make your symptoms worse, such as bright lights. It may help to lie down in a dark, quiet room. Do not drive or use heavy machinery. Ask your health care provider what activities are safe for you while you are experiencing symptoms. Keep all follow-up visits as told by your health care provider. This is important. Contact a health care provider if: You develop symptoms that are different or more severe than your usual migraine headache symptoms. You have more than 15 headache days in one month. Get help right away if: Your migraine headache becomes severe. Your migraine headache lasts longer than 72 hours. You have a fever. You have a stiff  neck. You have vision loss. Your muscles feel weak or like you cannot control them. You start to lose your balance often. You have trouble walking. You faint. You have a seizure. Summary A migraine headache is an intense, throbbing pain on one side or both sides of the head. Migraines may also cause other symptoms, such as nausea, vomiting, and sensitivity to light and noise. This condition may be treated with medicines and lifestyle changes. You may also need to avoid certain things that trigger a migraine headache. Keep a journal to find out what may trigger your migraine headaches. Contact your health care provider if you have more than 15 headache days in a month or you develop symptoms that are different or more severe than your usual migraine headache symptoms. This information is not  intended to replace advice given to you by your health care provider. Make sure you discuss any questions you have with your health care provider. Document Revised: 08/29/2021 Document Reviewed: 04/29/2018 Elsevier Patient Education  [place].   Nortriptyline Capsules What is this medication? NORTRIPTYLINE (nor TRIP ti leen) treats depression. It increases the amount of serotonin and norepinephrine in the brain, substances that help regulate mood. It belongs to a group of medications called tricyclic antidepressants (TCAs). This medicine may be used for other purposes; ask your health care provider or pharmacist if you have questions. COMMON BRAND NAME(S): Aventyl, Pamelor What should I tell my care team before I take this medication? They need to know if you have any of these conditions: Bipolar disorder Brugada syndrome Difficulty passing urine Glaucoma Heart disease If you drink alcohol Liver disease Schizophrenia Seizures Suicidal thoughts, plans or attempt; a previous suicide attempt by you or a family member Thyroid disease An unusual or allergic reaction to nortriptyline, other tricyclic antidepressants, other medications, foods, dyes, or preservatives Pregnant or trying to get pregnant Breast-feeding How should I use this medication? Take this medication by mouth with a glass of water. Follow the directions on the prescription label. Take your doses at regular intervals. Do not take it more often than directed. Do not stop taking this medication suddenly except upon the advice of your care team. Stopping this medication too quickly may cause serious side effects or your condition may worsen. A special MedGuide will be given to you by the pharmacist with each prescription and refill. Be sure to read this information carefully each time. Talk to your care team about the use of this medication in children. Special care may be needed. Overdosage: If you think you have  taken too much of this medicine contact a poison control center or emergency room at once. NOTE: This medicine is only for you. Do not share this medicine with others. What if I miss a dose? If you miss a dose, take it as soon as you can. If it is almost time for your next dose, take only that dose. Do not take double or extra doses. What may interact with this medication? Do not take this medication with any of the following: Cisapride Dronedarone Linezolid MAOIs, such as Marplan, Nardil, and Parnate Methylene blue (injected into a vein) Pimozide Thioridazine This medication may also interact with the following: Alcohol Antihistamines for allergy, cough, and cold Atropine Buspirone Certain medications for bladder problems, such as oxybutynin or tolterodine Certain medications for depression, such as amitriptyline, fluoxetine, sertraline Certain medications for headaches, such as sumatriptan or rizatriptan Certain medications for Parkinson disease, such as benztropine or trihexyphenidyl Certain medications for  stomach problems, such as dicyclomine or hyoscyamine Certain medications for travel sickness, such as scopolamine Chlorpropamide Cimetidine Fentanyl Ipratropium Lithium Other medications that cause heart rhythm changes, such as dofetilide Quinidine Reserpine St. John's wort Thyroid medication Tramadol Tryptophan This list may not describe all possible interactions. Give your health care provider a list of all the medicines, herbs, non-prescription drugs, or dietary supplements you use. Also tell them if you smoke, drink alcohol, or use illegal drugs. Some items may interact with your medicine. What should I watch for while using this medication? Tell your care team if your symptoms do not get better or if they get worse. Visit your care team for regular checks on your progress. Because it may take several weeks to see the full effects of this medication, it is important to  continue your treatment as prescribed by your care team. Patients and their families should watch out for new or worsening thoughts of suicide or depression. Also watch out for sudden changes in feelings such as feeling anxious, agitated, panicky, irritable, hostile, aggressive, impulsive, severely restless, overly excited and hyperactive, or not being able to sleep. If this happens, especially at the beginning of treatment or after a change in dose, call your care team. You may get drowsy or dizzy. Do not drive, use machinery, or do anything that needs mental alertness until you know how this medication affects you. Do not stand or sit up quickly, especially if you are an older patient. This reduces the risk of dizzy or fainting spells. Alcohol may interfere with the effect of this medication. Avoid alcoholic drinks. Do not treat yourself for coughs, colds, or allergies without asking your care team for advice. Some ingredients can increase possible side effects. Your mouth may get dry. Chewing sugarless gum or sucking hard candy, and drinking plenty of water may help. Contact your care team if the problem does not go away or is severe. This medication may cause dry eyes and blurred vision. If you wear contact lenses you may feel some discomfort. Lubricating drops may help. See your eye doctor if the problem does not go away or is severe. This medication can cause constipation. Try to have a bowel movement at least every 2 to 3 days. If you do not have a bowel movement for 3 days, call your care team. This medication can make you more sensitive to the sun. Keep out of the sun. If you cannot avoid being in the sun, wear protective clothing and use sunscreen. Do not use sun lamps or tanning beds/booths. What side effects may I notice from receiving this medication? Side effects that you should report to your care team as soon as possible: Allergic reactions--skin rash, itching, hives, swelling of the face,  lips, tongue, or throat Heart rhythm changes--fast or irregular heartbeat, dizziness, feeling faint or lightheaded, chest pain, trouble breathing Irritability, confusion, fast or irregular heartbeat, muscle stiffness, twitching muscles, sweating, high fever, seizure, chills, vomiting, diarrhea, which may be signs of serotonin syndrome Seizures Sudden eye pain or change in vision such as blurry vision, seeing halos around lights, vision loss Thoughts of suicide or self-harm, worsening mood, feelings of depression Trouble passing urine Side effects that usually do not require medical attention (report to your care team if they continue or are bothersome): Change in sex drive or performance Constipation Dizziness Drowsiness Dry mouth Tremors or shaking This list may not describe all possible side effects. Call your doctor for medical advice about side effects. You may report  side effects to FDA at 1-800-FDA-1088. Where should I keep my medication? Keep out of the reach of children. Store at room temperature between 15 and 30 degrees C (59 and 86 degrees F). Keep container tightly closed. Throw away any unused medication after the expiration date. NOTE: This sheet is a summary. It may not cover all possible information. If you have questions about this medicine, talk to your doctor, pharmacist, or health care provider.  2023 Elsevier/Gold Standard (2020-12-06 00:00:00)

## 2022-03-07 LAB — TSH RFX ON ABNORMAL TO FREE T4: TSH: 2.17 u[IU]/mL (ref 0.450–4.500)

## 2022-03-07 LAB — VITAMIN D 25 HYDROXY (VIT D DEFICIENCY, FRACTURES): Vit D, 25-Hydroxy: 13.1 ng/mL — ABNORMAL LOW (ref 30.0–100.0)

## 2022-03-10 ENCOUNTER — Other Ambulatory Visit: Payer: Self-pay | Admitting: Neurology

## 2022-03-10 ENCOUNTER — Encounter: Payer: Self-pay | Admitting: Neurology

## 2022-03-10 MED ORDER — VITAMIN D (ERGOCALCIFEROL) 1.25 MG (50000 UNIT) PO CAPS: 50000.0000 [IU] | ORAL_CAPSULE | ORAL | 3 refills | Status: DC

## 2022-03-11 ENCOUNTER — Encounter: Payer: Self-pay | Admitting: Neurology

## 2022-03-12 ENCOUNTER — Ambulatory Visit: Payer: PRIVATE HEALTH INSURANCE | Admitting: Emergency Medicine

## 2022-03-18 ENCOUNTER — Encounter: Payer: Self-pay | Admitting: Obstetrics and Gynecology

## 2022-03-18 ENCOUNTER — Other Ambulatory Visit (HOSPITAL_COMMUNITY)
Admission: RE | Admit: 2022-03-18 | Discharge: 2022-03-18 | Disposition: A | Discharge status: Home / Self Care | Payer: PRIVATE HEALTH INSURANCE | Source: Ambulatory Visit | Attending: Obstetrics and Gynecology | Admitting: Obstetrics and Gynecology

## 2022-03-18 ENCOUNTER — Ambulatory Visit (INDEPENDENT_AMBULATORY_CARE_PROVIDER_SITE_OTHER): Payer: PRIVATE HEALTH INSURANCE | Admitting: Obstetrics and Gynecology

## 2022-03-18 VITALS — BP 131/83 | HR 88 | Ht 62.0 in | Wt 194.0 lb

## 2022-03-18 DIAGNOSIS — R87610 Atypical squamous cells of undetermined significance on cytologic smear of cervix (ASC-US): Secondary | ICD-10-CM | POA: Insufficient documentation

## 2022-03-18 NOTE — Patient Instructions (Signed)
Colposcopy, Care After  The following information offers guidance on how to care for yourself after your procedure. Your doctor may also give you more specific instructions. If you have problems or questions, contact your doctor. What can I expect after the procedure? If you did not have a sample of your tissue taken out (did not have a biopsy), you may only have some spotting of blood for a few days. You can go back to your normal activities. If you had a sample of your tissue taken out, it is common to have: Soreness and mild pain. These may last for a few days. Mild bleeding or fluid (discharge) coming from your vagina. The fluid will look dark and grainy. You may have this for a few days. The fluid may be caused by a liquid that was used during your procedure. You may need to wear a sanitary pad. Spotting of blood for at least 48 hours after the procedure. Follow these instructions at home: Medicines Take over-the-counter and prescription medicines only as told by your doctor. Ask your doctor what over-the-counter pain medicines and prescription medicines you can start taking again. This is very important if you take blood thinners. Activity For at least 3 days, or for as long as told by your doctor, avoid: Douching. Using tampons. Having sex. Return to your normal activities as told by your doctor. Ask your doctor what activities are safe for you. General instructions Ask your doctor if you may take baths, swim, or use a hot tub. You may take showers. If you use birth control (contraception), keep using it. Keep all follow-up visits. Contact a doctor if: You have a fever or chills. You faint or feel light-headed. Get help right away if: You bleed a lot from your vagina. A lot of bleeding means that the bleeding soaks through a pad in less than 1 hour. You have clumps of blood (blood clots) coming from your vagina. You have signs that could mean you have an infection. This may be  fluid coming from your vagina that is: Different than normal. Yellow. Bad-smelling. You have very bad pain or cramps in your lower belly that do not get better with medicine. Summary If you did not have a sample of your tissue taken out, you may only have some spotting of blood for a few days. You can go back to your normal activities. If you had a sample of your tissue taken out, it is common to have mild pain for a few days and spotting for 48 hours. Avoid douching, using tampons, and having sex for at least 3 days after the procedure or for as long as told. Get help right away if you have a lot of bleeding, very bad pain, or signs of infection. This information is not intended to replace advice given to you by your health care provider. Make sure you discuss any questions you have with your health care provider. Document Revised: 08/12/2020 Document Reviewed: 08/12/2020 Elsevier Patient Education  2023 [employer]vier Inc.  

## 2022-03-18 NOTE — Progress Notes (Addendum)
51 y.o GYN presents for COLPO for ASCUS on PAP.  Pt wants to numbed for the procedure.  UPT today is Negative.

## 2022-03-18 NOTE — Progress Notes (Signed)
    GYNECOLOGY CLINIC COLPOSCOPY PROCEDURE NOTE  51 y.o. G2P0010 here for colposcopy for ASCUS with NEGATIVE high risk HPV pap smear on 10/23.   Patient given informed consent, signed copy in the chart, time out was performed.  Placed in lithotomy position. Cervix viewed with speculum and colposcope after application of acetic acid.   Colposcopy adequate? Yes  acetowhite lesion(s) noted at 12 & 6 o'clock; corresponding biopsies obtained.  ECC specimen obtained. Monsel's applied All specimens were labelled and sent to pathology.  Patient was given post procedure instructions.  Will follow up pathology and manage accordingly.  Routine preventative health maintenance measures emphasized.    Arlina Robes, MD, [place] Attending [place] for [place]

## 2022-03-19 LAB — SURGICAL PATHOLOGY

## 2022-03-27 ENCOUNTER — Encounter: Payer: Self-pay | Admitting: Emergency Medicine

## 2022-03-27 ENCOUNTER — Encounter: Payer: Self-pay | Admitting: Obstetrics and Gynecology

## 2022-03-27 NOTE — Telephone Encounter (Signed)
Orthopedist has to refill this pain medication.

## 2022-03-28 ENCOUNTER — Other Ambulatory Visit: Payer: Self-pay | Admitting: Emergency Medicine

## 2022-03-28 DIAGNOSIS — Z202 Contact with and (suspected) exposure to infections with a predominantly sexual mode of transmission: Secondary | ICD-10-CM

## 2022-03-28 DIAGNOSIS — N898 Other specified noninflammatory disorders of vagina: Secondary | ICD-10-CM

## 2022-03-28 MED ORDER — METRONIDAZOLE 0.75 % VA GEL: 1.0000 | Freq: Two times a day (BID) | VAGINAL | 3 refills | Status: DC

## 2022-03-28 NOTE — Progress Notes (Signed)
Rx for vaginal odor, discharge.

## 2022-04-04 ENCOUNTER — Encounter: Payer: Self-pay | Admitting: Emergency Medicine

## 2022-04-04 ENCOUNTER — Telehealth: Payer: Self-pay | Admitting: *Deleted

## 2022-04-04 ENCOUNTER — Encounter: Payer: Self-pay | Admitting: Neurology

## 2022-04-04 ENCOUNTER — Encounter: Payer: Self-pay | Admitting: *Deleted

## 2022-04-04 NOTE — Telephone Encounter (Signed)
PA for Wendy Howard denied, appeal stated, awaiting determination. Patient aware

## 2022-04-04 NOTE — Telephone Encounter (Signed)
Called patient and left message for patient to call office in reference to the appeal for the Ubrelvy.

## 2022-05-01 NOTE — Progress Notes (Signed)
This encounter was created in error - please disregard.

## 2022-06-09 ENCOUNTER — Other Ambulatory Visit (HOSPITAL_COMMUNITY): Payer: Self-pay

## 2022-06-23 ENCOUNTER — Telehealth: Payer: PRIVATE HEALTH INSURANCE | Admitting: Neurology

## 2022-06-23 ENCOUNTER — Telehealth: Payer: Self-pay | Admitting: Neurology

## 2022-06-23 NOTE — Telephone Encounter (Signed)
Rescheduled VV to 07/11/22- MD out sick.

## 2022-07-11 ENCOUNTER — Telehealth: Payer: Self-pay | Admitting: Neurology

## 2022-07-11 ENCOUNTER — Telehealth: Payer: PRIVATE HEALTH INSURANCE | Admitting: Neurology

## 2022-07-11 NOTE — Progress Notes (Deleted)
[facility]    Provider:  Dr Lucia Gaskins Requesting Provider: Georgina Quint, * Primary Care Provider:  Georgina Quint, MD  CC:  migraines  HPI:  Wendy Howard is a 52 y.o. female here as requested by Georgina Quint, * for migraines.  She has a past medical history of OSA on CPAP, chronic tension type headache non-intractable, migraines, chronic constipation, severe OSA, current smoker.  Reviewed Dr. Latrelle Dodrill notes.  She has a history of migraines.  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  History of OSA on CPAP.  Gets about 2-3 headaches per week partially responsive to Excedrin Migraine.  Migraines are affecting her life, he suggested [place] and neurology referral.  She was also given a Medrol Dosepak at last appointment.  She was counseled on smoking.  I am going to request her CPAP compliance report.  It does not appear she has followed up with our sleep team for years she may not be compliant.  She saw Amy Lomax in 2021, sleep study was on October 10, 2018 showed severe obstructive sleep apnea with an AHI of 46.3 and exacerbated in rem sleep to 71.1 and supine 55.6.  At that time she was 100% compliant.  I do not see any brain imaging in epic.  I also reviewed "care everywhere" I do not see any brain imaging, and I do not see any notes in epic or in "Care Everywhere" from any prior neurology appointments.  She is compliant with cpap. She has terrible migraines, they can so severe she cries, it is unilateral, pulsating/pounding/throbbing, photo/phonophobia. Nausea, improvement hurts, a dark quiet room helps, imitrex(side effects).Going on for 4-5 years no changes in quality, severity, quantity. On average >> 8 migraine days a month and > 15 headache days a month. No aura. No medication overuse. They can last up to 24 hours and be severe. Not positional, not exertional, no vision changes, no focal neurologic deficits. Sister has migraines.  They started when she was pregnant. Ibuprofen did not help. Bernita Raisin helps a little. No other focal neurologic deficits, associated symptoms, inciting events or modifiable factors.  From a thorough review of records in epic, medications tried that can be used in migraines and headaches include Tylenol, aspirin, Decadron, ibuprofen, Toradol, Mobic, naproxen, oxycodone, prednisone taper pack, Zofran injections, Zoloft, Imitrex, tramadol, Ubrelvy,, excedrin, starting Topiramate   Reviewed compliance report and data  as well as sleep apnea report and data: severe sleep apnea. On cpap residual ahi 0.6  30/30 use 100% compliant  Reviewed notes, labs and imaging from outside physicians, which showed:  No results found for this or any previous visit (from the past 2160 hour(s)).      Latest Ref Rng & Units 07/25/2021    2:40 PM 07/23/2020   11:28 AM 09/01/2019   10:07 AM  CMP  Glucose 70 - 99 mg/dL 99  86  92   BUN 6 - 23 mg/dL Creatinine 0.40 - 1.20 mg/dL 4.09  8.11  9.14   Sodium 135 - 145 mEq/L 136  138  137   Potassium 3.5 - 5.1 mEq/L 3.7  3.9  4.3   Chloride 96 - 112 mEq/L 104  105  103   CO2 19 - 32 mEq/L Calcium 8.4 - 10.5 mg/dL 8.7  8.7  8.8   Total Protein 6.0 - 8.3 g/dL 7.0  7.0  6.7   Total Bilirubin  0.2 - 1.2 mg/dL 0.3  0.4  0.4   Alkaline Phos 39 - 117 U/L 91  82  86   AST 0 - 37 U/L 13  11  9    ALT 0 - 35 U/L 14  9  8         Latest Ref Rng & Units 07/25/2021    2:40 PM 07/23/2020   11:28 AM 09/01/2019   10:07 AM  CBC  WBC 4.0 - 10.5 K/uL 8.2  7.3  7.8   Hemoglobin 12.0 - 15.0 g/dL 46.5  03.5  46.5   Hematocrit 36.0 - 46.0 % 40.1  39.6  40.6   Platelets 150.0 - 400.0 K/uL 252.0  239.0  245     Review of Systems: Patient complains of symptoms per HPI as well as the following symptoms fatigue. Pertinent negatives and positives per HPI. All others negative.   Social History   Socioeconomic History   Marital status: Married    Spouse name: Mikle Bosworth    Number of children: 0   Years of education: Not on file   Highest education level: Not on file  Occupational History   Occupation: Agricultural engineer   Tobacco Use   Smoking status: Every Day    Packs/day: .5    Types: Cigarettes    Last attempt to quit: 03/08/2020    Years since quitting: 2.3   Smokeless tobacco: Never  Vaping Use   Vaping Use: Never used  Substance and Sexual Activity   Alcohol use: Yes    Alcohol/week: 0.0 standard drinks of alcohol    Comment: occaisional    Drug use: No   Sexual activity: Yes    Partners: Male    Birth control/protection: None    Comment: monagamous partner   Other Topics Concern   Not on file  Social History Narrative   She is married. Lives at home with husband.   Right Handed   1 Cup of Coffee per Day    Social Determinants of Health   Financial Resource Strain: Low Risk  (05/20/2017)   Overall Financial Resource Strain (CARDIA)    Difficulty of Paying Living Expenses: Not hard at all  Food Insecurity: No Food Insecurity (05/20/2017)   Hunger Vital Sign    Worried About Running Out of Food in the Last Year: Never true    Ran Out of Food in the Last Year: Never true  Transportation Needs: No Transportation Needs (05/20/2017)   PRAPARE - Administrator, Civil Service (Medical): No    Lack of Transportation (Non-Medical): No  Physical Activity: Inactive (05/20/2017)   Exercise Vital Sign    Days of Exercise per Week: 0 days    Minutes of Exercise per Session: 0 min  Stress: No Stress Concern Present (05/20/2017)   [employer] of Occupational Health - Occupational Stress Questionnaire    Feeling of Stress : Only a little  Social Connections: Moderately Isolated (05/20/2017)   Social Connection and Isolation Panel [NHANES]    Frequency of Communication with Friends and Family: Twice a week    Frequency of Social Gatherings with Friends and Family: Never    Attends Religious Services: Never     Database administrator or Organizations: No    Attends Banker Meetings: Never    Marital Status: Married  Catering manager Violence: Not At Risk (05/20/2017)   Humiliation, Afraid, Rape, and Kick questionnaire    Fear of Current or Ex-Partner:  No    Emotionally Abused: No    Physically Abused: No    Sexually Abused: No    Family History  Problem Relation Age of Onset   Hypertension Father    Hypertension Sister    Hypertension Maternal Grandmother    Colon cancer Neg Hx    Colon polyps Neg Hx    Esophageal cancer Neg Hx    Rectal cancer Neg Hx    Stomach cancer Neg Hx     Past Medical History:  Diagnosis Date   GERD (gastroesophageal reflux disease)    Migraine    Sleep apnea    cpap    Patient Active Problem List   Diagnosis Date Noted   OSA on CPAP 03/06/2022   Chronic migraine without aura without status migrainosus, not intractable 01/20/2022   ASCUS of cervix with negative high risk HPV 01/09/2022   Chronic tension-type headache, not intractable 07/25/2021   Chronic constipation 07/25/2021   History of migraine 07/23/2020   Severe obstructive sleep apnea-hypopnea syndrome 08/25/2019   Chronic intermittent hypoxia with obstructive sleep apnea 08/25/2019   Paroxysmal supraventricular tachycardia 09/08/2018   Other fatigue 09/08/2018   Narrow pharyngeal airway 09/08/2018   Current smoker 09/08/2018   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     methocarbamol (ROBAXIN) 500 MG tablet Take 500 mg by mouth 3 (three) times daily.     metroNIDAZOLE (METROGEL VAGINAL) 0.75 % vaginal gel Place 1 Applicatorful vaginally 2 (two) times daily. 70 g 3   omeprazole (PRILOSEC) 40 MG capsule Take 1 capsule (40 mg total) by mouth daily. 30 capsule 3   topiramate (TOPAMAX) 50 MG tablet Take 1 tablet (50 mg total) by mouth 2 (two) times daily. Start with one pill at bedtime. In 2 weeks start one pill twice daily. If it makes you sleepy can take them both together at bedtime 60 tablet 6   traMADol (ULTRAM) 50 MG tablet Take 1 tablet (50 mg total) by mouth every 12 (twelve) hours as needed. 30 tablet 1   Ubrogepant (UBRELVY) 100 MG TABS Take 100 mg by mouth daily as needed. 30 tablet 1   Vitamin D, Ergocalciferol, (DRISDOL) 1.25 MG (50000 UNIT) CAPS capsule Take 1 capsule (50,000 Units total) by mouth every 7 (seven) days. 5 capsule 3   No current facility-administered medications for this visit.    Allergies as of 07/11/2022 - Review Complete 03/18/2022  Allergen Reaction Noted   Other Hives 03/30/2012    Vitals: There were no vitals taken for this visit. Last Weight:  Wt Readings from Last 1 Encounters:  03/18/22 194 lb (88 kg)   Last Height:   Ht Readings from Last 1 Encounters:  03/18/22 5\' 2"  (1.575 m)     Physical exam: Exam: Gen: NAD, conversant, well nourised, obese, well groomed  CV: RRR, no MRG. No Carotid Bruits. No peripheral edema, warm, nontender Eyes: Conjunctivae clear without exudates or hemorrhage  Neuro: Detailed Neurologic Exam  Speech:    Speech is normal; fluent and spontaneous with normal comprehension.  Cognition:    The patient is oriented to person, place, and time;     recent and remote memory intact;     language fluent;     normal attention, concentration,     fund of knowledge Cranial Nerves:    The pupils are  equal, round, and reactive to light. The fundi are normal and spontaneous venous pulsations are present. Visual fields are full to finger confrontation. Extraocular movements are intact. Trigeminal sensation is intact and the muscles of mastication are normal. The face is symmetric. The palate elevates in the midline. Hearing intact. Voice is normal. Shoulder shrug is normal. The tongue has normal motion without fasciculations.   Coordination:    Normal finger to nose and heel to shin. Normal rapid alternating movements.   Gait:    Heel-toe and tandem gait are normal.   Motor Observation:    No asymmetry, no atrophy, and no involuntary movements noted. Tone:    Normal muscle tone.    Posture:    Posture is normal. normal erect    Strength:    Strength is V/V in the upper and lower limbs.      Sensation: intact to LT     Reflex Exam:  DTR's:    Deep tendon reflexes in the upper and lower extremities are normal bilaterally.   Toes:    The toes are downgoing bilaterally.   Clonus:    Clonus is absent.    Assessment/Plan:  patient with chronic migraines. No red flags for imaging and she declines, low threshold for mri brainin future. 100% compliance with cpap for her severe sleep apnea/ discussed weight loss as well.   Chronic migraines:  There are 3 meds to try prior to being able to get the newer great med called Ajovy and Emgality (I don;t like Aimovig bc of constipation): Topiramate, Propranolol and amitriptyline/nortriptyline.  Continue to take Bernita Raisin as needed  We have another medication called Nurtec and may be able to be approved for that every other day for prevention. Also used as needed like the ubrelvy.   Plan: Try: Topiramate then propranolol then nortriptyline. If none work or side effects try Ajovy or Emgality once monthly new meds. Also have option for botox for migraines. Aimovig contraindicated due to constipation. Continue Ubrelvy prn.   Nortriptyline at  bedtime. May also help with sleeping.  No orders of the defined types were placed in this encounter.  No orders of the defined types were placed in this encounter.  Discussed: To prevent or relieve headaches, try the following: Cool Compress. Lie down and place a cool compress on your head.  Avoid headache triggers. If certain foods or odors seem to have triggered your migraines in the past, avoid them. A headache diary might help you identify triggers.  Include physical activity in your daily routine. Try a daily walk or other moderate aerobic exercise.  Manage stress. Find healthy ways to cope with the stressors, such as delegating tasks on your to-do list.  Practice relaxation techniques. Try deep breathing, yoga, massage and visualization.  Eat regularly. Eating regularly scheduled meals and maintaining a healthy diet might help prevent headaches. Also, drink plenty of fluids.  Follow a regular sleep schedule. Sleep deprivation might contribute to headaches Consider biofeedback.  With this mind-body technique, you learn to control certain bodily functions -- such as muscle tension, heart rate and blood pressure -- to prevent headaches or reduce headache pain.    Proceed to emergency room if you experience new or worsening symptoms or symptoms do not resolve, if you have new neurologic symptoms or if headache is severe, or for any concerning symptom.   Provided education and documentation from American headache Society toolbox including articles on: chronic migraine medication overuse headache, chronic migraines, prevention of migraines, behavioral and other nonpharmacologic treatments for headache.   Cc: Georgina Quint, *,  [place], [place], [place]  Naomie Dean, MD  [facility] [address] [place], [place] [address]  Phone [phone] Fax [phone]  I spent over 60 minutes of face-to-face and non-face-to-face time with patient on the   No diagnosis found.  diagnosis.  This included previsit chart review, lab review, study review, order entry, electronic health record documentation, patient education on the different diagnostic and therapeutic options, counseling and coordination of care, risks and benefits of management, compliance, or risk factor reduction

## 2022-07-11 NOTE — Telephone Encounter (Signed)
Dr. Lucia Gaskins moved the 11:15 Appt for 4/12 further to July as a Mychart. Patient is doing well with the medication. She need a a refill for the Tooamax. She has had 2 migranes took the Topamax and the backup Ubrelvy prescribed by the PCP and it helped.

## 2022-07-14 NOTE — Telephone Encounter (Signed)
Spoke with patient regarding possible need of refills for her Topamax. Patient states she does not need refills and she is tolerating the 50mg  dose 2x day. No other concerns at this time.

## 2022-07-19 ENCOUNTER — Other Ambulatory Visit: Payer: Self-pay | Admitting: Neurology

## 2022-08-07 ENCOUNTER — Encounter: Payer: Self-pay | Admitting: Neurology

## 2022-08-07 DIAGNOSIS — G43709 Chronic migraine without aura, not intractable, without status migrainosus: Secondary | ICD-10-CM

## 2022-08-07 MED ORDER — TOPIRAMATE 50 MG PO TABS: 50.0000 mg | ORAL_TABLET | Freq: Two times a day (BID) | ORAL | 0 refills | Status: DC

## 2022-08-07 NOTE — Addendum Note (Signed)
Addended by: Bertram Savin on: 08/07/2022 12:55 PM   Modules accepted: Orders

## 2022-09-23 ENCOUNTER — Other Ambulatory Visit: Payer: Self-pay | Admitting: Obstetrics and Gynecology

## 2022-09-23 DIAGNOSIS — Z1231 Encounter for screening mammogram for malignant neoplasm of breast: Secondary | ICD-10-CM

## 2022-09-29 ENCOUNTER — Telehealth: Payer: PRIVATE HEALTH INSURANCE | Admitting: Neurology

## 2022-11-03 ENCOUNTER — Ambulatory Visit
Admission: RE | Admit: 2022-11-03 | Discharge: 2022-11-03 | Disposition: A | Discharge status: Home / Self Care | Payer: PRIVATE HEALTH INSURANCE | Source: Ambulatory Visit | Attending: Obstetrics and Gynecology | Admitting: Obstetrics and Gynecology

## 2022-11-03 DIAGNOSIS — Z1231 Encounter for screening mammogram for malignant neoplasm of breast: Secondary | ICD-10-CM

## 2022-11-13 DIAGNOSIS — L750 Bromhidrosis: Secondary | ICD-10-CM | POA: Insufficient documentation

## 2022-11-13 DIAGNOSIS — R87810 Cervical high risk human papillomavirus (HPV) DNA test positive: Secondary | ICD-10-CM | POA: Insufficient documentation

## 2022-12-10 ENCOUNTER — Encounter: Payer: Self-pay | Admitting: Neurology

## 2022-12-10 DIAGNOSIS — G43709 Chronic migraine without aura, not intractable, without status migrainosus: Secondary | ICD-10-CM

## 2022-12-11 MED ORDER — TOPIRAMATE 50 MG PO TABS: 50.0000 mg | ORAL_TABLET | Freq: Two times a day (BID) | ORAL | 0 refills | Status: DC

## 2022-12-11 NOTE — Telephone Encounter (Signed)
Topamax refill x 1 sent to [facility] pharmacy.

## 2022-12-30 ENCOUNTER — Telehealth: Payer: Self-pay | Admitting: Neurology

## 2022-12-30 NOTE — Telephone Encounter (Signed)
Pt has called and rescheduled her last missed appointment, she is also on wait list.  Pt is asking if while waiting for her appointment will Dr Lucia Gaskins manage her Ubrogepant (UBRELVY) 100 MG TABS (which was prescribed by her previous PCP, her current PCP will not fill/manage)

## 2022-12-31 ENCOUNTER — Telehealth: Payer: Self-pay

## 2022-12-31 MED ORDER — UBRELVY 100 MG PO TABS: ORAL_TABLET | ORAL | 5 refills | Status: DC

## 2022-12-31 MED ORDER — UBRELVY 100 MG PO TABS: 100.0000 mg | ORAL_TABLET | Freq: Every day | ORAL | 5 refills | Status: DC | PRN

## 2022-12-31 NOTE — Telephone Encounter (Signed)
*  [facility]  Pharmacy Patient Advocate Encounter   Received notification from CoverMyMeds that prior authorization for Ubrelvy 100MG  tablets  is required/requested.   Insurance verification completed.   The patient is insured through [employer] .   Per test claim: PA required; PA started via CoverMyMeds. KEY [id number] . Waiting for clinical questions to populate.

## 2023-03-23 ENCOUNTER — Encounter: Payer: Self-pay | Admitting: Neurology

## 2023-03-23 DIAGNOSIS — G43709 Chronic migraine without aura, not intractable, without status migrainosus: Secondary | ICD-10-CM

## 2023-03-23 MED ORDER — TOPIRAMATE 50 MG PO TABS: 50.0000 mg | ORAL_TABLET | Freq: Two times a day (BID) | ORAL | 0 refills | Status: DC

## 2023-03-23 NOTE — Telephone Encounter (Signed)
Refill sent to pharmacy.   

## 2023-03-23 NOTE — Telephone Encounter (Signed)
Pt called wanting to know if this will be called in for her soon due to her only having 2 pills left. Please advise.  topiramate (TOPAMAX) 50 MG tablet

## 2023-05-18 ENCOUNTER — Encounter: Payer: Self-pay | Admitting: Neurology

## 2023-05-18 ENCOUNTER — Ambulatory Visit: Payer: No Typology Code available for payment source | Admitting: Neurology

## 2023-05-18 DIAGNOSIS — G43009 Migraine without aura, not intractable, without status migrainosus: Secondary | ICD-10-CM

## 2023-05-18 MED ORDER — TOPIRAMATE 50 MG PO TABS: 50.0000 mg | ORAL_TABLET | Freq: Two times a day (BID) | ORAL | 4 refills | Status: DC

## 2023-05-18 MED ORDER — UBRELVY 100 MG PO TABS: ORAL_TABLET | ORAL | 11 refills | Status: DC

## 2023-05-18 NOTE — Progress Notes (Signed)
 [facility]    Provider:  Dr Lucia Gaskins Requesting Provider: Verlon Au, MD Primary Care Provider:  Verlon Au, MD  CC:  migraines  05/18/2023; doing well, does needs refills.   Patient complains of symptoms per HPI as well as the following symptoms: none . Pertinent negatives and positives per HPI. All others negative   HPI 03/06/2022:  Wendy Howard is a 53 y.o. female here as requested by Verlon Au, MD for migraines.  She has a past medical history of OSA on CPAP, chronic tension type headache non-intractable, migraines, chronic constipation, severe OSA, current smoker.  Reviewed Dr. Latrelle Dodrill notes.  She has a history of migraines.  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  History of OSA on CPAP.  Gets about 2-3 headaches per week partially responsive to Excedrin Migraine.  Migraines are affecting her life, he suggested [place] and neurology referral.  She was also given a Medrol Dosepak at last appointment.  She was counseled on smoking.  I am going to request her CPAP compliance report.  It does not appear she has followed up with our sleep team for years she may not be compliant.  She saw Amy Lomax in 2021, sleep study was on October 10, 2018 showed severe obstructive sleep apnea with an AHI of 46.3 and exacerbated in rem sleep to 71.1 and supine 55.6.  At that time she was 100% compliant.  I do not see any brain imaging in epic.  I also reviewed "care everywhere" I do not see any brain imaging, and I do not see any notes in epic or in "Care Everywhere" from any prior neurology appointments.  She is compliant with cpap. She has terrible migraines, they can so severe she cries, it is unilateral, pulsating/pounding/throbbing, photo/phonophobia. Nausea, improvement hurts, a dark quiet room helps, imitrex(side effects).Going on for 4-5 years no changes in quality, severity, quantity. On average >> 8 migraine days a month and >  15 headache days a month. No aura. No medication overuse. They can last up to 24 hours and be severe. Not positional, not exertional, no vision changes, no focal neurologic deficits. Sister has migraines. They started when she was pregnant. Ibuprofen did not help. Bernita Raisin helps a little. No other focal neurologic deficits, associated symptoms, inciting events or modifiable factors.  From a thorough review of records in epic, medications tried that can be used in migraines and headaches include Tylenol, aspirin, Decadron, ibuprofen, Toradol, Mobic, naproxen, oxycodone, prednisone taper pack, Zofran injections, Zoloft, Imitrex, tramadol, Ubrelvy,, excedrin, starting Topiramate, maxamt  Reviewed compliance report and data  as well as sleep apnea report and data: severe sleep apnea. On cpap residual ahi 0.6  30/30 use 100% compliant  Reviewed notes, labs and imaging from outside physicians, which showed:  No results found for this or any previous visit (from the past 2160 hours).      Latest Ref Rng & Units 07/25/2021    2:40 PM 07/23/2020   11:28 AM 09/01/2019   10:07 AM  CMP  Glucose 70 - 99 mg/dL 99  86  92   BUN 6 - 23 mg/dL 13  13  14    Creatinine 0.40 - 1.20 mg/dL 5.40  9.81  1.91   Sodium 135 - 145 mEq/L 136  138  137   Potassium 3.5 - 5.1 mEq/L 3.7  3.9  4.3   Chloride 96 - 112 mEq/L 104  105  103   CO2 19 - 32 mEq/L  26  27  22    Calcium 8.4 - 10.5 mg/dL 8.7  8.7  8.8   Total Protein 6.0 - 8.3 g/dL 7.0  7.0  6.7   Total Bilirubin 0.2 - 1.2 mg/dL 0.3  0.4  0.4   Alkaline Phos 39 - 117 U/L 91  82  86   AST 0 - 37 U/L 13  11  9    ALT 0 - 35 U/L 14  9  8         Latest Ref Rng & Units 07/25/2021    2:40 PM 07/23/2020   11:28 AM 09/01/2019   10:07 AM  CBC  WBC 4.0 - 10.5 K/uL 8.2  7.3  7.8   Hemoglobin 12.0 - 15.0 g/dL 78.2  95.6  21.3   Hematocrit 36.0 - 46.0 % 40.1  39.6  40.6   Platelets 150.0 - 400.0 K/uL 252.0  239.0  245     Review of Systems: Patient complains of symptoms per  HPI as well as the following symptoms fatigue. Pertinent negatives and positives per HPI. All others negative.   Social History   Socioeconomic History   Marital status: Married    Spouse name: Mikle Bosworth   Number of children: 0   Years of education: Not on file   Highest education level: Not on file  Occupational History   Occupation: Agricultural engineer   Tobacco Use   Smoking status: Every Day    Current packs/day: 0.00    Types: Cigarettes    Last attempt to quit: 03/08/2020    Years since quitting: 3.1   Smokeless tobacco: Never  Vaping Use   Vaping status: Never Used  Substance and Sexual Activity   Alcohol use: Yes    Alcohol/week: 0.0 standard drinks of alcohol    Comment: occaisional    Drug use: No   Sexual activity: Yes    Partners: Male    Birth control/protection: None    Comment: monagamous partner   Other Topics Concern   Not on file  Social History Narrative   She is married. Lives at home with husband.   Right Handed   1 Cup of Coffee per Day    Social Drivers of Health   Financial Resource Strain: Low Risk  (05/20/2017)   Overall Financial Resource Strain (CARDIA)    Difficulty of Paying Living Expenses: Not hard at all  Food Insecurity: Low Risk  (05/27/2022)   [facility]   Hunger Vital Sign    Worried About Running Out of Food in the Last Year: Never true    Within the past 12 months, the food you bought just didn't last and you didn't have money to get more: Not on file  Transportation Needs: No Transportation Needs (05/27/2022)   [facility] from [facility] visits prior to 05/31/2022., [facility] visits prior to 05/31/2022.   Transportation    In the past 12 months, has lack of reliable transportation kept you from medical appointments, meetings, work or from getting things needed for daily living?: No  Physical Activity: Inactive (05/20/2017)   Exercise  Vital Sign    Days of Exercise per Week: 0 days    Minutes of Exercise per Session: 0 min  Stress: No Stress Concern Present (05/20/2017)   [employer] of Occupational Health - Occupational Stress Questionnaire    Feeling of Stress : Only a little  Social Connections: Moderately Isolated (05/20/2017)  Social Connection and Isolation Panel [NHANES]    Frequency of Communication with Friends and Family: Twice a week    Frequency of Social Gatherings with Friends and Family: Never    Attends Religious Services: Never    Database administrator or Organizations: No    Attends Banker Meetings: Never    Marital Status: Married  Catering manager Violence: Not At Risk (05/20/2017)   Humiliation, Afraid, Rape, and Kick questionnaire    Fear of Current or Ex-Partner: No    Emotionally Abused: No    Physically Abused: No    Sexually Abused: No    Family History  Problem Relation Age of Onset   Hypertension Father    Hypertension Sister    Hypertension Maternal Grandmother    Colon cancer Neg Hx    Colon polyps Neg Hx    Esophageal cancer Neg Hx    Rectal cancer Neg Hx    Stomach cancer Neg Hx     Past Medical History:  Diagnosis Date   GERD (gastroesophageal reflux disease)    Migraine    Sleep apnea    cpap    Patient Active Problem List   Diagnosis Date Noted   OSA on CPAP 03/06/2022   Chronic migraine without aura without status migrainosus, not intractable 01/20/2022   ASCUS of cervix with negative high risk HPV 01/09/2022   Chronic tension-type headache, not intractable 07/25/2021   Chronic constipation 07/25/2021   History of migraine 07/23/2020   Severe obstructive sleep apnea-hypopnea syndrome 08/25/2019   Chronic intermittent hypoxia with obstructive sleep apnea 08/25/2019   Paroxysmal supraventricular tachycardia (HCC) 09/08/2018   Other fatigue 09/08/2018   Narrow pharyngeal airway 09/08/2018   Current smoker 09/08/2018   Arthritis of wrist  03/22/2018   Ganglion cyst of finger of right hand 03/22/2018    Past Surgical History:  Procedure Laterality Date   DILATION AND EVACUATION N/A 05/02/2013   Procedure: SUCTION DILATATION AND EVACUATION;  Surgeon: Brock Bad, MD;  Location: [facility];  Service: Gynecology;  Laterality: N/A;   KNEE SURGERY     KNEE SURGERY     KNEE SURGERY      Current Outpatient Medications  Medication Sig Dispense Refill   cetirizine (EQ ALLERGY RELIEF, CETIRIZINE,) 10 MG tablet Take 1 tablet by mouth once daily 90 tablet 3   cholecalciferol (VITAMIN D3) 25 MCG (1000 UNIT) tablet Take 1,000 Units by mouth daily.     gabapentin (NEURONTIN) 300 MG capsule Take 300 mg by mouth 2 (two) times daily.     methocarbamol (ROBAXIN) 500 MG tablet Take 500 mg by mouth 3 (three) times daily.     omeprazole (PRILOSEC) 40 MG capsule Take 1 capsule (40 mg total) by mouth daily. 30 capsule 3   traMADol (ULTRAM) 50 MG tablet Take 1 tablet (50 mg total) by mouth every 12 (twelve) hours as needed. 30 tablet 1   topiramate (TOPAMAX) 50 MG tablet Take 1 tablet (50 mg total) by mouth 2 (two) times daily. If it makes you sleepy can take them both together at bedtime 120 tablet 4   Ubrogepant (UBRELVY) 100 MG TABS Take one tablet onset migraine, may take another in 2 hours if needed. (Max 2 tabs/day) PLEASE USE COPAY CARD:BIN [id number] PCN CNRX GRP [id number] ID [id number] 16 tablet 11   No current facility-administered medications for this visit.    Allergies as of 05/18/2023 - Review Complete 05/18/2023  Allergen Reaction Noted   Other Hives 03/30/2012  Vitals: BP (!) 146/82 (Cuff Size: Large) Comment: manual  Pulse 72   Ht 5' 2.5" (1.588 m)   Wt 187 lb 12.8 oz (85.2 kg)   SpO2 98%   BMI 33.80 kg/m  Last Weight:  Wt Readings from Last 1 Encounters:  05/18/23 187 lb 12.8 oz (85.2 kg)   Last Height:   Ht Readings from Last 1 Encounters:  05/18/23 5' 2.5" (1.588 m)    Exam: NAD, pleasant                   Speech:    Speech is normal; fluent and spontaneous with normal comprehension.  Cognition:    The patient is oriented to person, place, and time;     recent and remote memory intact;     language fluent;    Cranial Nerves:    The pupils are equal, round, and reactive to light.Trigeminal sensation is intact and the muscles of mastication are normal. The face is symmetric. The palate elevates in the midline. Hearing intact. Voice is normal. Shoulder shrug is normal. The tongue has normal motion without fasciculations.   Coordination:  No dysmetria  Motor Observation:    No asymmetry, no atrophy, and no involuntary movements noted. Tone:    Normal muscle tone.     Strength:    Strength is V/V in the upper and lower limbs.      Sensation: intact to LT    Assessment/Plan:  patient with chronic migraines. No red flags for imaging and she declines, low threshold for mri brainin future. 100% compliance with cpap for her severe sleep apnea/ discussed weight loss as well.  Doing great on ubrelvy, 4 migraine days a month and < 8 total hjeadache days a month, failed imitrex and maxalt Continue topiramate Filled out copay card with patient for ubrelvy    No orders of the defined types were placed in this encounter.  Meds ordered this encounter  Medications   topiramate (TOPAMAX) 50 MG tablet    Sig: Take 1 tablet (50 mg total) by mouth 2 (two) times daily. If it makes you sleepy can take them both together at bedtime    Dispense:  120 tablet    Refill:  4    Must be seen for further refills   DISCONTD: Ubrogepant (UBRELVY) 100 MG TABS    Sig: Take one tablet onset migraine, may take another in 2 hours if needed. (Max 2 tabs/day) PLEASE USE COPAY CARD:BIN [id number] PCN CNRX GRP [id number] ID [id number]    Dispense:  16 tablet    Refill:  11    PLEASE USE COPAY CARD:BIN [id number] PCN CNRX GRP [id number] ID [id number]   Ubrogepant (UBRELVY) 100 MG TABS    Sig: Take one tablet onset  migraine, may take another in 2 hours if needed. (Max 2 tabs/day) PLEASE USE COPAY CARD:BIN [id number] PCN CNRX GRP [id number] ID [id number]    Dispense:  16 tablet    Refill:  11    Diagnoses is episodic migraine (NOT CHRonIC,DISREGARD LAST PRESCRIPTION AND USE THIS ONE WITH RIGHT DX CODE) PLEASE USE COPAY CARD:BIN [id number] PCN CNRX GRP [id number] ID [id number]   Discussed: To prevent or relieve headaches, try the following: Cool Compress. Lie down and place a cool compress on your head.  Avoid headache triggers. If certain foods or odors seem to have triggered your migraines in the past, avoid them. A headache diary might help you identify triggers.  Include physical activity in your daily routine.  Try a daily walk or other moderate aerobic exercise.  Manage stress. Find healthy ways to cope with the stressors, such as delegating tasks on your to-do list.  Practice relaxation techniques. Try deep breathing, yoga, massage and visualization.  Eat regularly. Eating regularly scheduled meals and maintaining a healthy diet might help prevent headaches. Also, drink plenty of fluids.  Follow a regular sleep schedule. Sleep deprivation might contribute to headaches Consider biofeedback. With this mind-body technique, you learn to control certain bodily functions -- such as muscle tension, heart rate and blood pressure -- to prevent headaches or reduce headache pain.    Proceed to emergency room if you experience new or worsening symptoms or symptoms do not resolve, if you have new neurologic symptoms or if headache is severe, or for any concerning symptom.   Provided education and documentation from American headache Society toolbox including articles on: chronic migraine medication overuse headache, chronic migraines, prevention of migraines, behavioral and other nonpharmacologic treatments for headache.   Cc: Verlon Au, MD,  Verlon Au, MD  Naomie Dean, MD  [facility] [address] [place], [place] [address]  Phone [phone] Fax [phone]  I spent over 10 minutes of face-to-face and non-face-to-face time with patient on the  1. Migraine without aura and without status migrainosus, not intractable     diagnosis.  This included previsit chart review, lab review, study review, order entry, electronic health record documentation, patient education on the different diagnostic and therapeutic options, counseling and coordination of care, risks and benefits of management, compliance, or risk factor reduction

## 2023-05-26 ENCOUNTER — Encounter: Payer: Self-pay | Admitting: Neurology

## 2023-05-27 NOTE — Telephone Encounter (Signed)
 I called [facility] and spoke with an associate. I was told the prescription was received on 2/17 but the filling process was just started today. Patient will be able to pickup soon.

## 2023-06-09 ENCOUNTER — Ambulatory Visit: Payer: Self-pay | Admitting: Emergency Medicine

## 2023-06-09 ENCOUNTER — Encounter: Payer: Self-pay | Admitting: Emergency Medicine

## 2023-06-09 VITALS — BP 118/82 | HR 78 | Temp 99.0°F | Ht 62.5 in | Wt 187.0 lb

## 2023-06-09 DIAGNOSIS — G4733 Obstructive sleep apnea (adult) (pediatric): Secondary | ICD-10-CM

## 2023-06-09 DIAGNOSIS — G43709 Chronic migraine without aura, not intractable, without status migrainosus: Secondary | ICD-10-CM | POA: Diagnosis not present

## 2023-06-09 DIAGNOSIS — I471 Supraventricular tachycardia, unspecified: Secondary | ICD-10-CM | POA: Diagnosis not present

## 2023-06-09 DIAGNOSIS — M2550 Pain in unspecified joint: Secondary | ICD-10-CM

## 2023-06-09 DIAGNOSIS — G44229 Chronic tension-type headache, not intractable: Secondary | ICD-10-CM | POA: Diagnosis not present

## 2023-06-09 DIAGNOSIS — F17200 Nicotine dependence, unspecified, uncomplicated: Secondary | ICD-10-CM

## 2023-06-09 MED ORDER — TRAMADOL HCL 50 MG PO TABS
50.0000 mg | ORAL_TABLET | Freq: Two times a day (BID) | ORAL | 1 refills | Status: DC | PRN
Start: 2023-06-09 — End: 2023-08-28

## 2023-06-09 MED ORDER — GABAPENTIN 300 MG PO CAPS: 300.0000 mg | ORAL_CAPSULE | Freq: Two times a day (BID) | ORAL | 1 refills | Status: DC

## 2023-06-09 NOTE — Assessment & Plan Note (Signed)
Stable.  No recent episodes. 

## 2023-06-09 NOTE — Assessment & Plan Note (Addendum)
 Better controlled. Recently saw neurologist in the office.  Office notes reviewed with patient. Doing great on ubrelvy, 4 migraine days a month and < 8 total hjeadache days a month, failed imitrex and maxalt

## 2023-06-09 NOTE — Patient Instructions (Signed)

## 2023-06-09 NOTE — Progress Notes (Signed)
 Wendy Howard 53 y.o.   Chief Complaint  Patient presents with   Medication Management    LOV 06/2021. Patient states she had to switch PCP due to insurance. Patient here to get tramadol and gabapentin    HISTORY OF PRESENT ILLNESS: This is a 53 y.o. female here today to reestablish care with me. Multiple chronic medical conditions including chronic migraine headaches and chronic joint pains Gabapentin and tramadol helps.  Does not take it daily. No other complaints or medical concerns today. Most recent neurology office visit last month.  Assessment and plan as follows:  Assessment/Plan:  patient with chronic migraines. No red flags for imaging and she declines, low threshold for mri brainin future. 100% compliance with cpap for her severe sleep apnea/ discussed weight loss as well.  Doing great on ubrelvy, 4 migraine days a month and < 8 total hjeadache days a month, failed imitrex and maxalt Continue topiramate Filled out copay card with patient for ubrelvy HPI   Prior to Admission medications   Medication Sig Start Date End Date Taking? Authorizing Provider  cetirizine (EQ ALLERGY RELIEF, CETIRIZINE,) 10 MG tablet Take 1 tablet by mouth once daily 10/18/21  Yes Miyo Aina, Eilleen Kempf, MD  cholecalciferol (VITAMIN D3) 25 MCG (1000 UNIT) tablet Take 1,000 Units by mouth daily.   Yes [provider]  gabapentin (NEURONTIN) 300 MG capsule Take 300 mg by mouth 2 (two) times daily.   Yes [provider]  omeprazole (PRILOSEC) 40 MG capsule Take 1 capsule (40 mg total) by mouth daily. 02/13/22  Yes Jacquelina Hewins, Eilleen Kempf, MD  traMADol (ULTRAM) 50 MG tablet Take 1 tablet (50 mg total) by mouth every 12 (twelve) hours as needed. 02/17/22  Yes Rayhan Groleau, Eilleen Kempf, MD  Ubrogepant (UBRELVY) 100 MG TABS Take one tablet onset migraine, may take another in 2 hours if needed. (Max 2 tabs/day) PLEASE USE COPAY CARD:BIN [id number] PCN [id number] GRP [id number] ID [id number]  05/18/23  Yes Anson Fret, MD  methocarbamol (ROBAXIN) 500 MG tablet Take 500 mg by mouth 3 (three) times daily. Patient not taking: Reported on 06/09/2023    [provider]  topiramate (TOPAMAX) 50 MG tablet Take 1 tablet (50 mg total) by mouth 2 (two) times daily. If it makes you sleepy can take them both together at bedtime Patient not taking: Reported on 06/09/2023 05/18/23   Anson Fret, MD    Allergies  Allergen Reactions   Other Hives    Seafood-hives    Patient Active Problem List   Diagnosis Date Noted   OSA on CPAP 03/06/2022   Chronic migraine without aura without status migrainosus, not intractable 01/20/2022   ASCUS of cervix with negative high risk HPV 01/09/2022   Chronic tension-type headache, not intractable 07/25/2021   Chronic constipation 07/25/2021   History of migraine 07/23/2020   Severe obstructive sleep apnea-hypopnea syndrome 08/25/2019   Chronic intermittent hypoxia with obstructive sleep apnea 08/25/2019   Paroxysmal supraventricular tachycardia (HCC) 09/08/2018   Other fatigue 09/08/2018   Narrow pharyngeal airway 09/08/2018   Current smoker 09/08/2018   Arthritis of wrist 03/22/2018   Ganglion cyst of finger of right hand 03/22/2018    Past Medical History:  Diagnosis Date   GERD (gastroesophageal reflux disease)    Migraine    Sleep apnea    cpap    Past Surgical History:  Procedure Laterality Date   DILATION AND EVACUATION N/A 05/02/2013   Procedure: SUCTION DILATATION AND EVACUATION;  Surgeon: Brock Bad,  MD;  Location: [facility];  Service: Gynecology;  Laterality: N/A;   KNEE SURGERY     KNEE SURGERY     KNEE SURGERY      Social History   Socioeconomic History   Marital status: Married    Spouse name: Mikle Bosworth   Number of children: 0   Years of education: Not on file   Highest education level: Not on file  Occupational History   Occupation: Agricultural engineer   Tobacco Use   Smoking  status: Every Day    Current packs/day: 0.00    Types: Cigarettes    Last attempt to quit: 03/08/2020    Years since quitting: 3.2   Smokeless tobacco: Never  Vaping Use   Vaping status: Never Used  Substance and Sexual Activity   Alcohol use: Yes    Alcohol/week: 0.0 standard drinks of alcohol    Comment: occaisional    Drug use: No   Sexual activity: Yes    Partners: Male    Birth control/protection: None    Comment: monagamous partner   Other Topics Concern   Not on file  Social History Narrative   She is married. Lives at home with husband.   Right Handed   1 Cup of Coffee per Day    Social Drivers of Health   Financial Resource Strain: Low Risk  (05/20/2017)   Overall Financial Resource Strain (CARDIA)    Difficulty of Paying Living Expenses: Not hard at all  Food Insecurity: Low Risk  (05/27/2022)   [facility]   Hunger Vital Sign    Worried About Running Out of Food in the Last Year: Never true    Within the past 12 months, the food you bought just didn't last and you didn't have money to get more: Not on file  Transportation Needs: No Transportation Needs (05/27/2022)   Received from [facility] visits prior to 05/31/2022., [facility] visits prior to 05/31/2022.   Transportation    In the past 12 months, has lack of reliable transportation kept you from medical appointments, meetings, work or from getting things needed for daily living?: No  Physical Activity: Inactive (05/20/2017)   Exercise Vital Sign    Days of Exercise per Week: 0 days    Minutes of Exercise per Session: 0 min  Stress: No Stress Concern Present (05/20/2017)   [employer] of Occupational Health - Occupational Stress Questionnaire    Feeling of Stress : Only a little  Social Connections: Moderately Isolated (05/20/2017)   Social Connection and Isolation Panel [NHANES]    Frequency of Communication with Friends and Family:  Twice a week    Frequency of Social Gatherings with Friends and Family: Never    Attends Religious Services: Never    Database administrator or Organizations: No    Attends Banker Meetings: Never    Marital Status: Married  Catering manager Violence: Not At Risk (05/20/2017)   Humiliation, Afraid, Rape, and Kick questionnaire    Fear of Current or Ex-Partner: No    Emotionally Abused: No    Physically Abused: No    Sexually Abused: No    Family History  Problem Relation Age of Onset   Hypertension Father    Hypertension Sister    Hypertension Maternal Grandmother    Colon cancer Neg Hx    Colon polyps Neg Hx    Esophageal cancer Neg Hx  Rectal cancer Neg Hx    Stomach cancer Neg Hx      Review of Systems  Constitutional: Negative.  Negative for chills and fever.  HENT: Negative.  Negative for congestion and sore throat.   Respiratory: Negative.  Negative for cough and shortness of breath.   Cardiovascular: Negative.  Negative for chest pain and palpitations.  Gastrointestinal:  Negative for abdominal pain, diarrhea, nausea and vomiting.  Genitourinary: Negative.  Negative for dysuria and hematuria.  Musculoskeletal:  Positive for joint pain.  Skin: Negative.  Negative for rash.  Neurological: Negative.  Negative for dizziness and headaches.  All other systems reviewed and are negative.   Vitals:   06/09/23 1546  BP: 118/82  Pulse: 78  Temp: 99 F (37.2 C)  SpO2: 96%    Physical Exam Vitals reviewed.  Constitutional:      Appearance: Normal appearance.  HENT:     Head: Normocephalic.     Mouth/Throat:     Mouth: Mucous membranes are moist.     Pharynx: Oropharynx is clear.  Eyes:     Extraocular Movements: Extraocular movements intact.     Pupils: Pupils are equal, round, and reactive to light.  Cardiovascular:     Rate and Rhythm: Normal rate and regular rhythm.     Pulses: Normal pulses.     Heart sounds: Normal heart sounds.   Pulmonary:     Effort: Pulmonary effort is normal.     Breath sounds: Normal breath sounds.  Musculoskeletal:     Cervical back: No tenderness.  Lymphadenopathy:     Cervical: No cervical adenopathy.  Skin:    General: Skin is warm and dry.     Capillary Refill: Capillary refill takes less than 2 seconds.  Neurological:     General: No focal deficit present.     Mental Status: She is alert and oriented to person, place, and time.  Psychiatric:        Mood and Affect: Mood normal.        Behavior: Behavior normal.      ASSESSMENT & PLAN: A total of 45 minutes was spent with the patient and counseling/coordination of care regarding preparing for this visit, review of most recent office visit notes, review of multiple chronic medical conditions and their management, review of all medications, review of most recent bloodwork results, review of health maintenance items, education on nutrition, prognosis, documentation, and need for follow up.   Problem List Items Addressed This Visit       Cardiovascular and Mediastinum   Paroxysmal supraventricular tachycardia (HCC)   Stable. No recent episodes.       Chronic migraine without aura without status migrainosus, not intractable   Better controlled. Recently saw neurologist in the office.  Office notes reviewed with patient. Doing great on ubrelvy, 4 migraine days a month and < 8 total hjeadache days a month, failed imitrex and maxalt      Relevant Medications   gabapentin (NEURONTIN) 300 MG capsule   traMADol (ULTRAM) 50 MG tablet     Respiratory   OSA on CPAP   Stable.  Tolerating it well.        Other   Current smoker   No longer smoking.      Chronic tension-type headache, not intractable   Relevant Medications   gabapentin (NEURONTIN) 300 MG capsule   traMADol (ULTRAM) 50 MG tablet   Arthralgia of multiple joints - Primary   Chronic stable condition. Pain management discussed. Responds  well to gabapentin and  occasional tramadol. Does not abuse medications. No active synovitis on physical exam No deformities seen.      Relevant Medications   gabapentin (NEURONTIN) 300 MG capsule   traMADol (ULTRAM) 50 MG tablet   Patient Instructions  Health Maintenance, Female Adopting a healthy lifestyle and getting preventive care are important in promoting health and wellness. Ask your health care provider about: The right schedule for you to have regular tests and exams. Things you can do on your own to prevent diseases and keep yourself healthy. What should I know about diet, weight, and exercise? Eat a healthy diet  Eat a diet that includes plenty of vegetables, fruits, low-fat dairy products, and lean protein. Do not eat a lot of foods that are high in solid fats, added sugars, or sodium. Maintain a healthy weight Body mass index (BMI) is used to identify weight problems. It estimates body fat based on height and weight. Your health care provider can help determine your BMI and help you achieve or maintain a healthy weight. Get regular exercise Get regular exercise. This is one of the most important things you can do for your health. Most adults should: Exercise for at least 150 minutes each week. The exercise should increase your heart rate and make you sweat (moderate-intensity exercise). Do strengthening exercises at least twice a week. This is in addition to the moderate-intensity exercise. Spend less time sitting. Even light physical activity can be beneficial. Watch cholesterol and blood lipids Have your blood tested for lipids and cholesterol at 51 years of age, then have this test every 5 years. Have your cholesterol levels checked more often if: Your lipid or cholesterol levels are high. You are older than 51 years of age. You are at high risk for heart disease. What should I know about cancer screening? Depending on your health history and family history, you may need to have cancer  screening at various ages. This may include screening for: Breast cancer. Cervical cancer. Colorectal cancer. Skin cancer. Lung cancer. What should I know about heart disease, diabetes, and high blood pressure? Blood pressure and heart disease High blood pressure causes heart disease and increases the risk of stroke. This is more likely to develop in people who have high blood pressure readings or are overweight. Have your blood pressure checked: Every 3-5 years if you are 20-13 years of age. Every year if you are 79 years old or older. Diabetes Have regular diabetes screenings. This checks your fasting blood sugar level. Have the screening done: Once every three years after age 44 if you are at a normal weight and have a low risk for diabetes. More often and at a younger age if you are overweight or have a high risk for diabetes. What should I know about preventing infection? Hepatitis B If you have a higher risk for hepatitis B, you should be screened for this virus. Talk with your health care provider to find out if you are at risk for hepatitis B infection. Hepatitis C Testing is recommended for: Everyone born from [birth year] through 1963. Anyone with known risk factors for hepatitis C. Sexually transmitted infections (STIs) Get screened for STIs, including gonorrhea and chlamydia, if: You are sexually active and are younger than 51 years of age. You are older than 51 years of age and your health care provider tells you that you are at risk for this type of infection. Your sexual activity has changed since you were last screened, and  you are at increased risk for chlamydia or gonorrhea. Ask your health care provider if you are at risk. Ask your health care provider about whether you are at high risk for HIV. Your health care provider may recommend a prescription medicine to help prevent HIV infection. If you choose to take medicine to prevent HIV, you should first get tested for HIV. You  should then be tested every 3 months for as long as you are taking the medicine. Pregnancy If you are about to stop having your period (premenopausal) and you may become pregnant, seek counseling before you get pregnant. Take 400 to 800 micrograms (mcg) of folic acid every day if you become pregnant. Ask for birth control (contraception) if you want to prevent pregnancy. Osteoporosis and menopause Osteoporosis is a disease in which the bones lose minerals and strength with aging. This can result in bone fractures. If you are 61 years old or older, or if you are at risk for osteoporosis and fractures, ask your health care provider if you should: Be screened for bone loss. Take a calcium or vitamin D supplement to lower your risk of fractures. Be given hormone replacement therapy (HRT) to treat symptoms of menopause. Follow these instructions at home: Alcohol use Do not drink alcohol if: Your health care provider tells you not to drink. You are pregnant, may be pregnant, or are planning to become pregnant. If you drink alcohol: Limit how much you have to: 0-1 drink a day. Know how much alcohol is in your drink. In the [place] one drink equals one 12 oz bottle of beer (355 mL), one 5 oz glass of wine (148 mL), or one 1 oz glass of hard liquor (44 mL). Lifestyle Do not use any products that contain nicotine or tobacco. These products include cigarettes, chewing tobacco, and vaping devices, such as e-cigarettes. If you need help quitting, ask your health care provider. Do not use street drugs. Do not share needles. Ask your health care provider for help if you need support or information about quitting drugs. General instructions Schedule regular health, dental, and eye exams. Stay current with your vaccines. Tell your health care provider if: You often feel depressed. You have ever been abused or do not feel safe at home. Summary Adopting a healthy lifestyle and getting preventive care are  important in promoting health and wellness. Follow your health care provider's instructions about healthy diet, exercising, and getting tested or screened for diseases. Follow your health care provider's instructions on monitoring your cholesterol and blood pressure. This information is not intended to replace advice given to you by your health care provider. Make sure you discuss any questions you have with your health care provider. Document Revised: 08/06/2020 Document Reviewed: 08/06/2020 Elsevier Patient Education  2024 Elsevier Inc.   Edwina Barth, MD [facility] at [facility]

## 2023-06-09 NOTE — Assessment & Plan Note (Signed)
Stable.  Tolerating it well.

## 2023-06-09 NOTE — Assessment & Plan Note (Signed)
 No longer smoking

## 2023-06-09 NOTE — Assessment & Plan Note (Signed)
 Chronic stable condition. Pain management discussed. Responds well to gabapentin and occasional tramadol. Does not abuse medications. No active synovitis on physical exam No deformities seen.

## 2023-06-22 ENCOUNTER — Encounter: Payer: Self-pay | Admitting: Emergency Medicine

## 2023-06-23 ENCOUNTER — Other Ambulatory Visit: Payer: Self-pay | Admitting: Radiology

## 2023-06-23 MED ORDER — OMEPRAZOLE 40 MG PO CPDR: 40.0000 mg | DELAYED_RELEASE_CAPSULE | Freq: Every day | ORAL | 3 refills | Status: DC

## 2023-07-25 ENCOUNTER — Encounter: Payer: Self-pay | Admitting: Emergency Medicine

## 2023-07-29 ENCOUNTER — Other Ambulatory Visit: Payer: Self-pay | Admitting: Radiology

## 2023-07-29 DIAGNOSIS — G8929 Other chronic pain: Secondary | ICD-10-CM

## 2023-08-14 ENCOUNTER — Encounter: Payer: Self-pay | Admitting: Physical Medicine and Rehabilitation

## 2023-08-23 ENCOUNTER — Encounter: Payer: Self-pay | Admitting: Emergency Medicine

## 2023-08-25 ENCOUNTER — Other Ambulatory Visit: Payer: Self-pay | Admitting: Radiology

## 2023-08-25 DIAGNOSIS — G8929 Other chronic pain: Secondary | ICD-10-CM

## 2023-08-28 ENCOUNTER — Other Ambulatory Visit: Payer: Self-pay | Admitting: Emergency Medicine

## 2023-08-28 ENCOUNTER — Encounter: Payer: Self-pay | Admitting: Emergency Medicine

## 2023-08-28 DIAGNOSIS — M2550 Pain in unspecified joint: Secondary | ICD-10-CM

## 2023-08-28 MED ORDER — TRAMADOL HCL 50 MG PO TABS: 50.0000 mg | ORAL_TABLET | Freq: Two times a day (BID) | ORAL | 1 refills | Status: AC | PRN

## 2023-09-24 ENCOUNTER — Encounter: Payer: Self-pay | Admitting: Obstetrics

## 2023-09-24 ENCOUNTER — Other Ambulatory Visit (HOSPITAL_COMMUNITY)
Admission: RE | Admit: 2023-09-24 | Discharge: 2023-09-24 | Disposition: A | Discharge status: Home / Self Care | Source: Ambulatory Visit | Attending: Obstetrics | Admitting: Obstetrics

## 2023-09-24 ENCOUNTER — Ambulatory Visit: Admitting: Obstetrics

## 2023-09-24 VITALS — BP 135/80 | HR 83 | Ht 62.5 in | Wt 193.2 lb

## 2023-09-24 DIAGNOSIS — Z01419 Encounter for gynecological examination (general) (routine) without abnormal findings: Secondary | ICD-10-CM

## 2023-09-24 DIAGNOSIS — E66811 Obesity, class 1: Secondary | ICD-10-CM | POA: Diagnosis not present

## 2023-09-24 DIAGNOSIS — R52 Pain, unspecified: Secondary | ICD-10-CM | POA: Diagnosis not present

## 2023-09-24 DIAGNOSIS — N898 Other specified noninflammatory disorders of vagina: Secondary | ICD-10-CM

## 2023-09-24 MED ORDER — IBUPROFEN 800 MG PO TABS: 800.0000 mg | ORAL_TABLET | Freq: Three times a day (TID) | ORAL | 5 refills | Status: AC | PRN

## 2023-09-24 MED ORDER — METRONIDAZOLE 0.75 % VA GEL: 1.0000 | Freq: Two times a day (BID) | VAGINAL | 4 refills | Status: AC

## 2023-09-24 NOTE — Progress Notes (Signed)
 Subjective:        Wendy Howard is a 53 y.o. female here for a routine exam.  Current complaints: Vaginal discharge.    Personal health questionnaire:  Is patient [name], have a family history of breast and/or ovarian cancer: no Is there a family history of uterine cancer diagnosed at age < 34, gastrointestinal cancer, urinary tract cancer, family member who is a Personnel officer syndrome-associated carrier: no Is the patient overweight and hypertensive, family history of diabetes, personal history of gestational diabetes, preeclampsia or PCOS: yes Is patient over 83, have PCOS,  family history of premature CHD under age 77, diabetes, smoke, have hypertension or peripheral artery disease:  no At any time, has a partner hit, kicked or otherwise hurt or frightened you?: no Over the past 2 weeks, have you felt down, depressed or hopeless?: no Over the past 2 weeks, have you felt little interest or pleasure in doing things?:no   Gynecologic History Patient's last menstrual period was 08/07/2023 (exact date). Contraception: none Last Pap: 2023. Results were: ASCUS with negative High Risk HPV Last mammogram: 2024. Results were: normal  Obstetric History OB History  Gravida Para Term Preterm AB Living  2    1   SAB IAB Ectopic Multiple Live Births  1        # Outcome Date GA Lbr Len/2nd Weight Sex Type Anes PTL Lv  2 SAB           1 Gravida              Birth Comments: System Generated. Please review and update pregnancy details.    Past Medical History:  Diagnosis Date   GERD (gastroesophageal reflux disease)    Migraine    Sleep apnea    cpap    Past Surgical History:  Procedure Laterality Date   DILATION AND EVACUATION N/A 05/02/2013   Procedure: SUCTION DILATATION AND EVACUATION;  Surgeon: Wendy DELENA Centers, MD;  Location: [facility];  Service: Gynecology;  Laterality: N/A;   KNEE SURGERY     KNEE SURGERY     KNEE SURGERY       Current Outpatient  Medications:    cholecalciferol (VITAMIN D3) 25 MCG (1000 UNIT) tablet, Take 1,000 Units by mouth daily., Disp: , Rfl:    ibuprofen  (ADVIL ) 800 MG tablet, Take 1 tablet (800 mg total) by mouth every 8 (eight) hours as needed., Disp: 30 tablet, Rfl: 5   metroNIDAZOLE  (METROGEL ) 0.75 % vaginal gel, Place 1 Applicatorful vaginally 2 (two) times daily., Disp: 70 g, Rfl: 4   cetirizine  (EQ ALLERGY RELIEF, CETIRIZINE ,) 10 MG tablet, Take 1 tablet by mouth once daily, Disp: 90 tablet, Rfl: 3   gabapentin  (NEURONTIN ) 300 MG capsule, Take 1 capsule (300 mg total) by mouth 2 (two) times daily., Disp: 180 capsule, Rfl: 1   methocarbamol (ROBAXIN) 500 MG tablet, Take 500 mg by mouth 3 (three) times daily. (Patient not taking: Reported on 06/09/2023), Disp: , Rfl:    omeprazole  (PRILOSEC) 40 MG capsule, Take 1 capsule (40 mg total) by mouth daily., Disp: 30 capsule, Rfl: 3   topiramate  (TOPAMAX ) 50 MG tablet, Take 1 tablet (50 mg total) by mouth 2 (two) times daily. If it makes you sleepy can take them both together at bedtime (Patient not taking: Reported on 06/09/2023), Disp: 120 tablet, Rfl: 4   traMADol  (ULTRAM ) 50 MG tablet, Take 1 tablet (50 mg total) by mouth every 12 (twelve) hours as needed., Disp: 30 tablet, Rfl:  1   Ubrogepant  (UBRELVY ) 100 MG TABS, Take one tablet onset migraine, may take another in 2 hours if needed. (Max 2 tabs/day) PLEASE USE COPAY CARD:BIN [id number] PCN [id number] GRP [id number] ID [id number], Disp: 16 tablet, Rfl: 11 Allergies  Allergen Reactions   Other Hives    Seafood-hives    Social History   Tobacco Use   Smoking status: Former    Current packs/day: 0.00    Types: Cigarettes    Quit date: 03/08/2020    Years since quitting: 3.5   Smokeless tobacco: Never  Substance Use Topics   Alcohol use: Yes    Alcohol/week: 0.0 standard drinks of alcohol    Comment: occaisional     Family History  Problem Relation Age of Onset   Hypertension Father    Hypertension Sister     Hypertension Maternal Grandmother    Colon cancer Neg Hx    Colon polyps Neg Hx    Esophageal cancer Neg Hx    Rectal cancer Neg Hx    Stomach cancer Neg Hx       Review of Systems  Constitutional: negative for fatigue and weight loss Respiratory: negative for cough and wheezing Cardiovascular: negative for chest pain, fatigue and palpitations Gastrointestinal: negative for abdominal pain and change in bowel habits Musculoskeletal:negative for myalgias Neurological: negative for gait problems and tremors Behavioral/Psych: negative for abusive relationship, depression Endocrine: negative for temperature intolerance    Genitourinary: positive for vaginal discharge.  negative for abnormal menstrual periods, genital lesions, hot flashes, sexual problems  Integument/breast: negative for breast lump, breast tenderness, nipple discharge and skin lesion(s)    Objective:       BP 135/80   Pulse 83   Ht 5' 2.5 (1.588 m)   Wt 193 lb 3.2 oz (87.6 kg)   LMP 08/07/2023 (Exact Date)   BMI 34.77 kg/m  General:   Alert and no distress    Skin:   no rash or abnormalities  Lungs:   clear to auscultation bilaterally  Heart:   regular rate and rhythm, S1, S2 normal, no murmur, click, rub or gallop  Breasts:   normal without suspicious masses, skin or nipple changes or axillary nodes  Abdomen:  normal findings: no organomegaly, soft, non-tender and no hernia  Pelvis:  External genitalia: normal general appearance Urinary system: urethral meatus normal and bladder without fullness, nontender Vaginal: normal without tenderness, induration or masses Cervix: normal appearance Adnexa: normal bimanual exam Uterus: anteverted and non-tender, normal size   Lab Review Urine pregnancy test Labs reviewed yes Radiologic studies reviewed yes  I have spent a total of 20 minutes of face-to-face time, excluding clinical staff time, reviewing notes and preparing to see patient, ordering tests and/or  medications, and counseling the patient.   Assessment:    1. Encounter for gynecological examination with Papanicolaou smear of cervix (Primary) Rx: - Cytology - PAP( [name])  2. Vaginal discharge Rx: - metroNIDAZOLE  (METROGEL ) 0.75 % vaginal gel; Place 1 Applicatorful vaginally 2 (two) times daily.  Dispense: 70 g; Refill: 4  3. Pain Rx: - ibuprofen  (ADVIL ) 800 MG tablet; Take 1 tablet (800 mg total) by mouth every 8 (eight) hours as needed.  Dispense: 30 tablet; Refill: 5  4. Obesity (BMI 30.0-34.9) - weight reduction with the aid of dietary changes, exercise and behavioral modification recommended     Plan:    Education reviewed: calcium supplements, depression evaluation, low fat, low cholesterol diet, safe sex/STD prevention, self breast exams, and weight bearing  exercise. Contraception: none. Follow up in: 1 year.   Meds ordered this encounter  Medications   metroNIDAZOLE  (METROGEL ) 0.75 % vaginal gel    Sig: Place 1 Applicatorful vaginally 2 (two) times daily.    Dispense:  70 g    Refill:  4   ibuprofen  (ADVIL ) 800 MG tablet    Sig: Take 1 tablet (800 mg total) by mouth every 8 (eight) hours as needed.    Dispense:  30 tablet    Refill:  5     Wendy RONAL CENTERS, MD, FACOG Attending Obstetrician & Gynecologist, [facility] for [facility], [facility], [place] 09/24/2023

## 2023-09-24 NOTE — Progress Notes (Signed)
 Pt presents for rob. Pt has symptoms of bv and would like the gel instead of pills.

## 2023-09-24 NOTE — Addendum Note (Signed)
 Addended by: MARCINE GAINS on: 09/24/2023 04:48 PM   Modules accepted: Orders

## 2023-09-25 LAB — CERVICOVAGINAL ANCILLARY ONLY
Bacterial Vaginitis (gardnerella): NEGATIVE
Candida Glabrata: NEGATIVE
Candida Vaginitis: NEGATIVE
Chlamydia: NEGATIVE
Comment: NEGATIVE
Comment: NEGATIVE
Comment: NEGATIVE
Comment: NEGATIVE
Comment: NEGATIVE
Comment: NORMAL
Neisseria Gonorrhea: NEGATIVE
Trichomonas: NEGATIVE

## 2023-09-29 LAB — CYTOLOGY - PAP
Comment: NEGATIVE
Diagnosis: UNDETERMINED — AB
High risk HPV: NEGATIVE

## 2023-09-30 ENCOUNTER — Ambulatory Visit: Payer: Self-pay | Admitting: Family Medicine

## 2023-09-30 DIAGNOSIS — Z01419 Encounter for gynecological examination (general) (routine) without abnormal findings: Secondary | ICD-10-CM

## 2023-10-15 ENCOUNTER — Other Ambulatory Visit: Payer: Self-pay | Admitting: Obstetrics & Gynecology

## 2023-10-15 DIAGNOSIS — Z0000 Encounter for general adult medical examination without abnormal findings: Secondary | ICD-10-CM

## 2023-10-16 ENCOUNTER — Other Ambulatory Visit: Payer: Self-pay | Admitting: Emergency Medicine

## 2023-10-21 ENCOUNTER — Ambulatory Visit: Admitting: Physical Medicine and Rehabilitation

## 2023-11-05 ENCOUNTER — Ambulatory Visit
Admission: RE | Admit: 2023-11-05 | Discharge: 2023-11-05 | Disposition: A | Discharge status: Home / Self Care | Source: Ambulatory Visit | Attending: Obstetrics & Gynecology | Admitting: Obstetrics & Gynecology

## 2023-11-05 DIAGNOSIS — Z0000 Encounter for general adult medical examination without abnormal findings: Secondary | ICD-10-CM

## 2023-11-13 ENCOUNTER — Encounter: Payer: Self-pay | Admitting: Emergency Medicine

## 2023-11-13 ENCOUNTER — Other Ambulatory Visit: Payer: Self-pay | Admitting: Radiology

## 2023-11-13 MED ORDER — CETIRIZINE HCL 10 MG PO TABS: 10.0000 mg | ORAL_TABLET | Freq: Every day | ORAL | 3 refills | Status: AC

## 2023-12-15 ENCOUNTER — Ambulatory Visit
Admission: RE | Admit: 2023-12-15 | Discharge: 2023-12-15 | Disposition: A | Discharge status: Home / Self Care | Source: Ambulatory Visit | Attending: Obstetrics & Gynecology | Admitting: Obstetrics & Gynecology

## 2023-12-15 ENCOUNTER — Other Ambulatory Visit: Payer: Self-pay | Admitting: Obstetrics

## 2023-12-15 DIAGNOSIS — Z0000 Encounter for general adult medical examination without abnormal findings: Secondary | ICD-10-CM

## 2023-12-28 ENCOUNTER — Encounter: Payer: Self-pay | Admitting: Neurology

## 2024-01-08 ENCOUNTER — Telehealth: Payer: Self-pay

## 2024-01-08 ENCOUNTER — Other Ambulatory Visit (HOSPITAL_COMMUNITY): Payer: Self-pay

## 2024-01-08 NOTE — Telephone Encounter (Signed)
 Unable to submit pa, PT no longer has [employer] and eligibility tracker is not pulling any current information. States previous coverage exp on 01/01/2024.

## 2024-02-09 ENCOUNTER — Encounter: Payer: Self-pay | Admitting: Emergency Medicine

## 2024-03-10 ENCOUNTER — Other Ambulatory Visit: Payer: Self-pay

## 2024-03-10 DIAGNOSIS — G43009 Migraine without aura, not intractable, without status migrainosus: Secondary | ICD-10-CM

## 2024-03-10 NOTE — Telephone Encounter (Signed)
 last prescibed by :  Dr. Ines  Last refilled by patient on : 02/08/24 Last office visit : 05/18/23 Next office visit : 04/19/24  PT'S NEUROLOGIST AT [facility] IS Dr Dohmeier  Per last office visit - Continue topiramate 

## 2024-03-12 MED ORDER — TOPIRAMATE 50 MG PO TABS: 50.0000 mg | ORAL_TABLET | Freq: Two times a day (BID) | ORAL | 0 refills | Status: DC

## 2024-04-19 ENCOUNTER — Encounter: Payer: Self-pay | Admitting: Family Medicine

## 2024-04-19 ENCOUNTER — Telehealth: Payer: No Typology Code available for payment source | Admitting: Family Medicine

## 2024-04-19 DIAGNOSIS — G43009 Migraine without aura, not intractable, without status migrainosus: Secondary | ICD-10-CM | POA: Diagnosis not present

## 2024-04-19 MED ORDER — TOPIRAMATE 50 MG PO TABS: 50.0000 mg | ORAL_TABLET | Freq: Two times a day (BID) | ORAL | 3 refills | Status: AC

## 2024-04-19 MED ORDER — UBRELVY 100 MG PO TABS: ORAL_TABLET | ORAL | 11 refills | Status: AC

## 2024-04-19 NOTE — Patient Instructions (Signed)
 Below is our plan:  We will continue topiramate  50mg  twice daily and Ubrelvy  as needed.   Please make sure you are staying well hydrated. I recommend 50-60 ounces daily. Well balanced diet and regular exercise encouraged. Consistent sleep schedule with 6-8 hours recommended.   Please continue follow up with care team as directed.   Follow up with me in 1 year   You may receive a survey regarding today's visit. I encourage you to leave honest feed back as I do use this information to improve patient care. Thank you for seeing me today!   GENERAL HEADACHE INFORMATION:   Natural supplements: Magnesium Oxide or Magnesium Glycinate 500 mg at bed (up to 800 mg daily) Coenzyme Q10 300 mg in AM Vitamin B2- 200 mg twice a day   Add 1 supplement at a time since even natural supplements can have undesirable side effects. You can sometimes buy supplements cheaper (especially Coenzyme Q10) at www.webmailguide.co.za or at [employer].  Migraine with aura: There is increased risk for stroke in women with migraine with aura and a contraindication for the combined contraceptive pill for use by women who have migraine with aura. The risk for women with migraine without aura is lower. However other risk factors like smoking are far more likely to increase stroke risk than migraine. There is a recommendation for no smoking and for the use of OCPs without estrogen such as progestogen only pills particularly for women with migraine with aura.SABRA People who have migraine headaches with auras may be 3 times more likely to have a stroke caused by a blood clot, compared to migraine patients who don't see auras. Women who take hormone-replacement therapy may be 30 percent more likely to suffer a clot-based stroke than women not taking medication containing estrogen. Other risk factors like smoking and high blood pressure may be  much more important.    Vitamins and herbs that show potential:   Magnesium: Magnesium (250 mg twice a  day or 500 mg at bed) has a relaxant effect on smooth muscles such as blood vessels. Individuals suffering from frequent or daily headache usually have low magnesium levels which can be increase with daily supplementation of 400-750 mg. Three trials found 40-90% average headache reduction  when used as a preventative. Magnesium may help with headaches are aura, the best evidence for magnesium is for migraine with aura is its thought to stop the cortical spreading depression we believe is the pathophysiology of migraine aura.Magnesium also demonstrated the benefit in menstrually related migraine.  Magnesium is part of the messenger system in the serotonin cascade and it is a good muscle relaxant.  It is also useful for constipation which can be a side effect of other medications used to treat migraine. Good sources include nuts, whole grains, and tomatoes. Side Effects: loose stool/diarrhea  Riboflavin (vitamin B 2) 200 mg twice a day. This vitamin assists nerve cells in the production of ATP a principal energy storing molecule.  It is necessary for many chemical reactions in the body.  There have been at least 3 clinical trials of riboflavin using 400 mg per day all of which suggested that migraine frequency can be decreased.  All 3 trials showed significant improvement in over half of migraine sufferers.  The supplement is found in bread, cereal, milk, meat, and poultry.  Most Americans get more riboflavin than the recommended daily allowance, however riboflavin deficiency is not necessary for the supplements to help prevent headache. Side effects: energizing, green urine  Coenzyme Q10: This is present in almost all cells in the body and is critical component for the conversion of energy.  Recent studies have shown that a nutritional supplement of CoQ10 can reduce the frequency of migraine attacks by improving the energy production of cells as with riboflavin.  Doses of 150 mg twice a day have been shown to be  effective.   Melatonin: Increasing evidence shows correlation between melatonin secretion and headache conditions.  Melatonin supplementation has decreased headache intensity and duration.  It is widely used as a sleep aid.  Sleep is natures way of dealing with migraine.  A dose of 3 mg is recommended to start for headaches including cluster headache. Higher doses up to 15 mg has been reviewed for use in Cluster headache and have been used. The rationale behind using melatonin for cluster is that many theories regarding the cause of Cluster headache center around the disruption of the normal circadian rhythm in the brain.  This helps restore the normal circadian rhythm.   HEADACHE DIET: Foods and beverages which may trigger migraine Note that only 20% of headache patients are food sensitive. You will know if you are food sensitive if you get a headache consistently 20 minutes to 2 hours after eating a certain food. Only cut out a food if it causes headaches, otherwise you might remove foods you enjoy! What matters most for diet is to eat a well balanced healthy diet full of vegetables and low fat protein, and to not miss meals.   Chocolate, other sweets ALL cheeses except cottage and cream cheese Dairy products, yogurt, sour cream, ice cream Liver Meat extracts (Bovril, Marmite, meat tenderizers) Meats or fish which have undergone aging, fermenting, pickling or smoking. These include: Hotdogs,salami,Lox,sausage, mortadellas,smoked salmon, pepperoni, Pickled herring Pods of broad bean (English beans, Chinese pea pods, Italian (fava) beans, lima and navy beans Ripe avocado, ripe banana Yeast extracts or active yeast preparations such as Brewer's or Fleishman's (commercial bakes goods are permitted) Tomato based foods, pizza (lasagna, etc.)   MSG (monosodium glutamate) is disguised as many things; look for these common aliases: Monopotassium glutamate Autolysed yeast Hydrolysed protein Sodium  caseinate flavorings all natural preservatives Nutrasweet   Avoid all other foods that convincingly provoke headaches.   Resources: The Dizzy Bluford Aid Your Headache Diet, migrainestrong.com  https://zamora-andrews.com/   Caffeine and Migraine For patients that have migraine, caffeine intake more than 3 days per week can lead to dependency and increased migraine frequency. I would recommend cutting back on your caffeine intake as best you can. The recommended amount of caffeine is 200-300 mg daily, although migraine patients may experience dependency at even lower doses. While you may notice an increase in headache temporarily, cutting back will be helpful for headaches in the long run. For more information on caffeine and migraine, visit: https://americanmigrainefoundation.org/resource-library/caffeine-and-migraine/   Headache Prevention Strategies:   1. Maintain a headache diary; learn to identify and avoid triggers.  - This can be a simple note where you log when you had a headache, associated symptoms, and medications used - There are several smartphone apps developed to help track migraines: Migraine Buddy, Migraine Monitor, Curelator N1-Headache App   Common triggers include: Emotional triggers: Emotional/Upset family or friends Emotional/Upset occupation Business reversal/success Anticipation anxiety Crisis-serious Post-crisis periodNew job/position   Physical triggers: Vacation Day Weekend Strenuous Exercise High Altitude Location New Move Menstrual Day Physical Illness Oversleep/Not enough sleep Weather changes Light: Photophobia or light sesnitivity treatment involves a balance between desensitization and reduction  in overly strong input. Use dark polarized glasses outside, but not inside. Avoid bright or fluorescent light, but do not dim environment to the point that going into a normally lit room hurts. Consider  FL-41 tint lenses, which reduce the most irritating wavelengths without blocking too much light.  These can be obtained at axonoptics.com or theraspecs.com Foods: see list above.   2. Limit use of acute treatments (over-the-counter medications, triptans, etc.) to no more than 2 days per week or 10 days per month to prevent medication overuse headache (rebound headache).     3. Follow a regular schedule (including weekends and holidays): Don't skip meals. Eat a balanced diet. 8 hours of sleep nightly. Minimize stress. Exercise 30 minutes per day. Being overweight is associated with a 5 times increased risk of chronic migraine. Keep well hydrated and drink 6-8 glasses of water per day.   4. Initiate non-pharmacologic measures at the earliest onset of your headache. Rest and quiet environment. Relax and reduce stress. Breathe2Relax is a free app that can instruct you on    some simple relaxtion and breathing techniques. Http://Dawnbuse.com is a    free website that provides teaching videos on relaxation.  Also, there are  many apps that   can be downloaded for mindful relaxation.  An app called YOGA NIDRA will help walk you through mindfulness. Another app called Calm can be downloaded to give you a structured mindfulness guide with daily reminders and skill development. Headspace for guided meditation Mindfulness Based Stress Reduction Online Course: www.palousemindfulness.com Cold compresses.   5. Don't wait!! Take the maximum allowable dosage of prescribed medication at the first sign of migraine.   6. Compliance:  Take prescribed medication regularly as directed and at the first sign of a migraine.   7. Communicate:  Call your physician when problems arise, especially if your headaches change, increase in frequency/severity, or become associated with neurological symptoms (weakness, numbness, slurred speech, etc.). Proceed to emergency room if you experience new or worsening symptoms or  symptoms do not resolve, if you have new neurologic symptoms or if headache is severe, or for any concerning symptom.   8. Headache/pain management therapies: Consider various complementary methods, including medication, behavioral therapy, psychological counselling, biofeedback, massage therapy, acupuncture, dry needling, and other modalities.  Such measures may reduce the need for medications. Counseling for pain management, where patients learn to function and ignore/minimize their pain, seems to work very well.   9. Recommend changing family's attention and focus away from patient's headaches. Instead, emphasize daily activities. If first question of day is 'How are your headaches/Do you have a headache today?', then patient will constantly think about headaches, thus making them worse. Goal is to re-direct attention away from headaches, toward daily activities and other distractions.   10. Helpful Websites: www.AmericanHeadacheSociety.org patenthood.ch www.headaches.org tightmarket.nl www.achenet.org

## 2024-04-19 NOTE — Progress Notes (Signed)
 "  PATIENT: Wendy Howard DOB: 21-Nov-1970  REASON FOR VISIT: follow up HISTORY FROM: patient  Virtual Visit via MyChart video  I connected with Wendy Howard on 04/19/24 at  9:00 AM EST via MyChart video and verified that I am speaking with the correct person using two identifiers.   I discussed the limitations, risks, security and privacy concerns of performing an evaluation and management service by Mychart video and the availability of in person appointments. I also discussed with the patient that there may be a patient responsible charge related to this service. The patient expressed understanding and agreed to proceed.   History of Present Illness:  04/19/24 ALL (MyChart): Wendy Howard is a 54 y.o. female here today for follow up for migraines. She was last seen by Dr Ines 02/2022. She continues topiramate  50mg  BID and Ubrelvy  as needed. She reports migraines are well managed. May have 1-2 headaches per month. Ubrelvy  works well. No concerns, today.   03/06/2022 AA:  Wendy Howard is a 54 y.o. female here as requested by Jolee Madelin Patch, MD for migraines.  She has a past medical history of OSA on CPAP, chronic tension type headache non-intractable, migraines, chronic constipation, severe OSA, current smoker.   Reviewed Dr. Lebron notes.  She has a history of migraines.  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  History of OSA on CPAP.  Gets about 2-3 headaches per week partially responsive to Excedrin Migraine.  Migraines are affecting her life, he suggested Ubrelvy  and neurology referral.  She was also given a Medrol Dosepak at last appointment.  She was counseled on smoking.  I am going to request her CPAP compliance report.  It does not appear she has followed up with our sleep team for years she may not be compliant.  She saw Sunshyne Horvath in 2021, sleep study was on October 10, 2018 showed severe obstructive  sleep apnea with an AHI of 46.3 and exacerbated in rem sleep to 71.1 and supine 55.6.  At that time she was 100% compliant.  I do not see any brain imaging in epic.  I also reviewed care everywhere I do not see any brain imaging, and I do not see any notes in epic or in Care Everywhere from any prior neurology appointments.   She is compliant with cpap. She has terrible migraines, they can so severe she cries, it is unilateral, pulsating/pounding/throbbing, photo/phonophobia. Nausea, improvement hurts, a dark quiet room helps, imitrex (side effects).Going on for 4-5 years no changes in quality, severity, quantity. On average >> 8 migraine days a month and > 15 headache days a month. No aura. No medication overuse. They can last up to 24 hours and be severe. Not positional, not exertional, no vision changes, no focal neurologic deficits. Sister has migraines. They started when she was pregnant. Ibuprofen  did not help. Ubrelvy  helps a little. No other focal neurologic deficits, associated symptoms, inciting events or modifiable factors.   From a thorough review of records in epic, medications tried that can be used in migraines and headaches include Tylenol , aspirin, Decadron , ibuprofen , Toradol , Mobic , naproxen , oxycodone , prednisone taper pack, Zofran  injections, Zoloft , Imitrex , tramadol , Ubrelvy ,, excedrin, starting Topiramate , maxamt  11/23/19 ALL:  Wendy Howard is a 54 y.o. female here today for follow up of recently diagnosed OSA started on CPAP. Split night sleep study on 10/10/2018 showed 1. Severe Obstructive Sleep Apnea (OSA) with an AHI of 46.3/h and  in REM sleep exacerbated to 71.1/h  and supine 55.6/h. 2. Severe Primary Snoring. 3. Intermittent hypoxemia. She was started on CPAP following CPAP titration on 08/23/2019. She reports that she is doing well. She has noted improvement in sleep quality since starting CPAP therapy. She is using a nasal pillow. She is working with her  mask and headgear to correct noted leak.   Compliance report dated 10/23/2019 through 11/21/2019 reveals that she used CPAP 30 of the past 30 days for compliance of 100%.  She used CPAP for greater than 4 hours all 30 days.  Average usage was 7 hours and 22 minutes.  Residual AHI was 1.4 on 6 to 10 cm of water and an EPR of 1.  There was a leak noted in the 95th percentile of 19.2.   HISTORY: (copied from Dr Dohmeier's note on 09/08/2018)  HPI:  Wendy Howard is a 54 y.o. female of AA descent seen on 09-08-2018 by video  in a referral  from Dr. Melonie for a sleep apnea evaluation.    Chief complaint according to patient : my husband and son have stated I snore loudly.   Sleep and medical history: [name] was referred by her primary care physician at primary care at [facility], Dr. Mikel Melonie.  She was seen for her regular annual physical and a screening for metabolic disorders was performed, including thyroid  and cholesterol levels, Zyrtec  was prescribed or recommended to be taken for allergic rhinitis.  The patient also brought up that she is snoring loudly with witnessed breathing disturbance according to her husband's observation.  The visit was a non-face-to-face visit. She has been seen in the ED with tachycardia and reports migraine headaches and morning headaches. Migraine-Symptoms began about 5 years ago. Home treatment has included excedrin, which will help.Status post Knee surgery 2015.     Family sleep and medical history: no family member with OSA known to her , she has 2 sisters- healthy , father still alive at age 66 and HTN . Mother died in an accident. Maternal GF died of colon cancer, had HTN.   Social history: smoker 10 cig a day. ETOH socially, 2/monthly.  Caffeine : 2 in AM, iced tea in evening , pepsi 4 /month.  Household consists of her and husband. No children. No pets. 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  walking of exercise. No Hobbies named. Likes  cheerleading.    Sleep habits are as follows: see notes During the coronavirus shutdown her work hours have been significantly reduced and just today she had some extra hours that she could log in.  It seems that prior to the coronavirus her bedtime would be more regular as it is currently.  She also states that she skips dinner she does not eat but if she eats she eats before 8 PM rather earlier may be as early as 6 PM.  Her bedtime now varies but used to be between 930 and 1030 but her sleep onset may be between 11 and midnight.  Her average sleep latency now with the corona crisis as well as before is over an hour.  She also reports that some nights she will wake up not knowing what will occur and she may be up for an hour.  She may go to the bathroom also she was not woken by the urge to urinate and she may go to the refrigerator also she does not feel hungry.  She may then sleep for another 4 hours until she rises usually around 630  by alarm.  She states the alarm as needed as she would not wake up spontaneously she averages a total sleep time of 8 to 9 hours currently.  She sleeps on her right side on one pillow and family stated that she never sleeps supine.  Most nights she seems to dream but these are not nightmares and the dreams are not disturbing to her.  When she wakes up she feels initially not refreshed or restored and is rather sluggish and fatigued.  Once she has taken her shower and had her breakfast coffee she is usually ready to face the day.  Some mornings she will wake up with headaches but again headaches do not wake her up. She has nearly always a dry mouth in the morning.   ED visit 02/17/18 10:49 PM Wendy Howard is a 54 y.o. female without significant past medical history.  She was at work about 7:45 PM and bent over.  She had the sudden onset of a rapid heartbeat.  She felt like her heart was racing and also skipping beats.  She had no associated shortness of breath,  chest pain or lightheadedness.  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  Valsalva and other vagal maneuvers were tried and her heart rate went down to 177 and she was sent here.  On arrival here her heart rate was 118 and EKG showed sinus tachycardia.  Her heart rate has subsequently slowed to the 90s.  She has no history of arrhythmia.   Observations/Objective:  Generalized: Well developed, in no acute distress  Mentation: Alert oriented to time, place, history taking. Follows all commands speech and language fluent   Assessment and Plan:  54 y.o. year old female  has a past medical history of GERD (gastroesophageal reflux disease), Migraine, and Sleep apnea. here with    ICD-10-CM   1. Migraine without aura and without status migrainosus, not intractable  G43.009 topiramate  (TOPAMAX ) 50 MG tablet    Ubrogepant  (UBRELVY ) 100 MG TABS     Wendy Howard reports doing well. Migraines remain well managed. She will continue topiramate  50mg  twice daily and Ubrelvy  as needed. Healthy lifestyle habits encouraged. Follow up with me in 1 year.   No orders of the defined types were placed in this encounter.   Meds ordered this encounter  Medications   topiramate  (TOPAMAX ) 50 MG tablet    Sig: Take 1 tablet (50 mg total) by mouth 2 (two) times daily. If it makes you sleepy can take them both together at bedtime. Please keep January appointment for further refills.    Dispense:  180 tablet    Refill:  3    Supervising Provider:   YAN, YIJUN [[id number]]   Ubrogepant  (UBRELVY ) 100 MG TABS    Sig: Take one tablet onset migraine, may take another in 2 hours if needed. (Max 2 tabs/day)    Dispense:  16 tablet    Refill:  11    Supervising Provider:   YAN, YIJUN [[id number]]     Follow Up Instructions:  I discussed the assessment and treatment plan with the patient. The patient was provided an opportunity to ask questions and all were  answered. The patient agreed with the plan and demonstrated an understanding of the instructions.   The patient was advised to call back or seek an in-person evaluation if the symptoms worsen or if the condition fails  to improve as anticipated.  I provided 15 minutes of face-to-face and non face-to-face time during this MyChart video encounter. Patient located at their place of residence. Provider is in the office.    Belen Zwahlen, NP  "

## 2024-04-25 ENCOUNTER — Other Ambulatory Visit (HOSPITAL_COMMUNITY): Payer: Self-pay

## 2024-05-02 ENCOUNTER — Other Ambulatory Visit (HOSPITAL_COMMUNITY): Payer: Self-pay

## 2024-05-03 ENCOUNTER — Ambulatory Visit: Admitting: Emergency Medicine

## 2024-05-03 ENCOUNTER — Encounter: Payer: Self-pay | Admitting: Emergency Medicine

## 2024-05-03 ENCOUNTER — Ambulatory Visit

## 2024-05-03 VITALS — BP 136/80 | HR 80 | Temp 98.2°F | Ht 62.5 in | Wt 192.0 lb

## 2024-05-03 DIAGNOSIS — G4733 Obstructive sleep apnea (adult) (pediatric): Secondary | ICD-10-CM | POA: Diagnosis not present

## 2024-05-03 DIAGNOSIS — M2550 Pain in unspecified joint: Secondary | ICD-10-CM | POA: Diagnosis not present

## 2024-05-03 DIAGNOSIS — Z0001 Encounter for general adult medical examination with abnormal findings: Secondary | ICD-10-CM | POA: Diagnosis not present

## 2024-05-03 DIAGNOSIS — Z13228 Encounter for screening for other metabolic disorders: Secondary | ICD-10-CM

## 2024-05-03 DIAGNOSIS — R002 Palpitations: Secondary | ICD-10-CM

## 2024-05-03 DIAGNOSIS — Z1329 Encounter for screening for other suspected endocrine disorder: Secondary | ICD-10-CM | POA: Diagnosis not present

## 2024-05-03 DIAGNOSIS — Z13220 Encounter for screening for lipoid disorders: Secondary | ICD-10-CM

## 2024-05-03 DIAGNOSIS — Z0000 Encounter for general adult medical examination without abnormal findings: Secondary | ICD-10-CM

## 2024-05-03 DIAGNOSIS — Z130 Encounter for screening for diseases of the blood and blood-forming organs and certain disorders involving the immune mechanism: Secondary | ICD-10-CM | POA: Diagnosis not present

## 2024-05-03 MED ORDER — METHOCARBAMOL 500 MG PO TABS: 500.0000 mg | ORAL_TABLET | Freq: Three times a day (TID) | ORAL | 2 refills | Status: AC

## 2024-05-03 MED ORDER — OMEPRAZOLE 40 MG PO CPDR: 40.0000 mg | DELAYED_RELEASE_CAPSULE | Freq: Every day | ORAL | 0 refills | Status: AC

## 2024-05-03 MED ORDER — GABAPENTIN 300 MG PO CAPS: 300.0000 mg | ORAL_CAPSULE | Freq: Two times a day (BID) | ORAL | 1 refills | Status: AC

## 2024-05-03 NOTE — Assessment & Plan Note (Signed)
 Chronic stable condition. Pain management discussed. Responds well to gabapentin and occasional tramadol. Does not abuse medications. No active synovitis on physical exam No deformities seen.

## 2024-05-03 NOTE — Assessment & Plan Note (Signed)
Stable.  Tolerating it well.

## 2024-05-03 NOTE — Progress Notes (Unsigned)
 EP to read.

## 2024-05-03 NOTE — Patient Instructions (Signed)
 Health Maintenance, Female Adopting a healthy lifestyle and getting preventive care are important in promoting health and wellness. Ask your health care provider about: The right schedule for you to have regular tests and exams. Things you can do on your own to prevent diseases and keep yourself healthy. What should I know about diet, weight, and exercise? Eat a healthy diet  Eat a diet that includes plenty of vegetables, fruits, low-fat dairy products, and lean protein. Do not eat a lot of foods that are high in solid fats, added sugars, or sodium. Maintain a healthy weight Body mass index (BMI) is used to identify weight problems. It estimates body fat based on height and weight. Your health care provider can help determine your BMI and help you achieve or maintain a healthy weight. Get regular exercise Get regular exercise. This is one of the most important things you can do for your health. Most adults should: Exercise for at least 150 minutes each week. The exercise should increase your heart rate and make you sweat (moderate-intensity exercise). Do strengthening exercises at least twice a week. This is in addition to the moderate-intensity exercise. Spend less time sitting. Even light physical activity can be beneficial. Watch cholesterol and blood lipids Have your blood tested for lipids and cholesterol at 52 years of age, then have this test every 5 years. Have your cholesterol levels checked more often if: Your lipid or cholesterol levels are high. You are older than 52 years of age. You are at high risk for heart disease. What should I know about cancer screening? Depending on your health history and family history, you may need to have cancer screening at various ages. This may include screening for: Breast cancer. Cervical cancer. Colorectal cancer. Skin cancer. Lung cancer. What should I know about heart disease, diabetes, and high blood pressure? Blood pressure and heart  disease High blood pressure causes heart disease and increases the risk of stroke. This is more likely to develop in people who have high blood pressure readings or are overweight. Have your blood pressure checked: Every 3-5 years if you are 32-37 years of age. Every year if you are 71 years old or older. Diabetes Have regular diabetes screenings. This checks your fasting blood sugar level. Have the screening done: Once every three years after age 24 if you are at a normal weight and have a low risk for diabetes. More often and at a younger age if you are overweight or have a high risk for diabetes. What should I know about preventing infection? Hepatitis B If you have a higher risk for hepatitis B, you should be screened for this virus. Talk with your health care provider to find out if you are at risk for hepatitis B infection. Hepatitis C Testing is recommended for: Everyone born from [birth year] through 1963. Anyone with known risk factors for hepatitis C. Sexually transmitted infections (STIs) Get screened for STIs, including gonorrhea and chlamydia, if: You are sexually active and are younger than 52 years of age. You are older than 52 years of age and your health care provider tells you that you are at risk for this type of infection. Your sexual activity has changed since you were last screened, and you are at increased risk for chlamydia or gonorrhea. Ask your health care provider if you are at risk. Ask your health care provider about whether you are at high risk for HIV. Your health care provider may recommend a prescription medicine to help prevent HIV  infection. If you choose to take medicine to prevent HIV, you should first get tested for HIV. You should then be tested every 3 months for as long as you are taking the medicine. Pregnancy If you are about to stop having your period (premenopausal) and you may become pregnant, seek counseling before you get pregnant. Take 400 to 800  micrograms (mcg) of folic acid every day if you become pregnant. Ask for birth control (contraception) if you want to prevent pregnancy. Osteoporosis and menopause Osteoporosis is a disease in which the bones lose minerals and strength with aging. This can result in bone fractures. If you are 42 years old or older, or if you are at risk for osteoporosis and fractures, ask your health care provider if you should: Be screened for bone loss. Take a calcium  or vitamin D  supplement to lower your risk of fractures. Be given hormone replacement therapy (HRT) to treat symptoms of menopause. Follow these instructions at home: Alcohol use Do not drink alcohol if: Your health care provider tells you not to drink. You are pregnant, may be pregnant, or are planning to become pregnant. If you drink alcohol: Limit how much you have to: 0-1 drink a day. Know how much alcohol is in your drink. In the [place] one drink equals one 12 oz bottle of beer (355 mL), one 5 oz glass of wine (148 mL), or one 1 oz glass of hard liquor (44 mL). Lifestyle Do not use any products that contain nicotine or tobacco. These products include cigarettes, chewing tobacco, and vaping devices, such as e-cigarettes. If you need help quitting, ask your health care provider. Do not use street drugs. Do not share needles. Ask your health care provider for help if you need support or information about quitting drugs. General instructions Schedule regular health, dental, and eye exams. Stay current with your vaccines. Tell your health care provider if: You often feel depressed. You have ever been abused or do not feel safe at home. This information is not intended to replace advice given to you by your health care provider. Make sure you discuss any questions you have with your health care provider. Document Revised: 09/23/2023 Document Reviewed: 08/06/2020 [employer] Patient Education  2025 [employer].

## 2024-05-05 ENCOUNTER — Ambulatory Visit: Payer: Self-pay | Admitting: Emergency Medicine

## 2024-05-05 ENCOUNTER — Encounter: Payer: Self-pay | Admitting: Emergency Medicine

## 2024-05-05 LAB — COMPREHENSIVE METABOLIC PANEL WITH GFR
ALT: 12 U/L (ref 3–35)
AST: 11 U/L (ref 5–37)
Albumin: 3.9 g/dL (ref 3.5–5.2)
Alkaline Phosphatase: 83 U/L (ref 39–117)
BUN: 12 mg/dL (ref 6–23)
CO2: 26 meq/L (ref 19–32)
Calcium: 8.4 mg/dL (ref 8.4–10.5)
Chloride: 108 meq/L (ref 96–112)
Creatinine, Ser: 0.84 mg/dL (ref 0.40–1.20)
GFR: 80.44 mL/min
Glucose, Bld: 112 mg/dL — ABNORMAL HIGH (ref 70–99)
Potassium: 4 meq/L (ref 3.5–5.1)
Sodium: 139 meq/L (ref 135–145)
Total Bilirubin: 0.3 mg/dL (ref 0.2–1.2)
Total Protein: 7.1 g/dL (ref 6.0–8.3)

## 2024-05-05 LAB — VITAMIN D 25 HYDROXY (VIT D DEFICIENCY, FRACTURES): VITD: 57.8 ng/mL (ref 30.00–100.00)

## 2024-05-05 LAB — LIPID PANEL
Cholesterol: 160 mg/dL (ref 28–200)
HDL: 39.3 mg/dL
LDL Cholesterol: 101 mg/dL — ABNORMAL HIGH (ref 10–99)
NonHDL: 120.32
Total CHOL/HDL Ratio: 4
Triglycerides: 95 mg/dL (ref 10.0–149.0)
VLDL: 19 mg/dL (ref 0.0–40.0)

## 2024-05-05 LAB — CBC WITH DIFFERENTIAL/PLATELET
Basophils Absolute: 0 10*3/uL (ref 0.0–0.1)
Basophils Relative: 0.3 % (ref 0.0–3.0)
Eosinophils Absolute: 0.1 10*3/uL (ref 0.0–0.7)
Eosinophils Relative: 2 % (ref 0.0–5.0)
HCT: 37.6 % (ref 36.0–46.0)
Hemoglobin: 12 g/dL (ref 12.0–15.0)
Lymphocytes Relative: 27.3 % (ref 12.0–46.0)
Lymphs Abs: 1.4 10*3/uL (ref 0.7–4.0)
MCHC: 31.9 g/dL (ref 30.0–36.0)
MCV: 85.9 fl (ref 78.0–100.0)
Monocytes Absolute: 0.5 10*3/uL (ref 0.1–1.0)
Monocytes Relative: 9.2 % (ref 3.0–12.0)
Neutro Abs: 3.2 10*3/uL (ref 1.4–7.7)
Neutrophils Relative %: 61.2 % (ref 43.0–77.0)
Platelets: 247 10*3/uL (ref 150.0–400.0)
RBC: 4.37 Mil/uL (ref 3.87–5.11)
RDW: 14.4 % (ref 11.5–15.5)
WBC: 5.3 10*3/uL (ref 4.0–10.5)

## 2024-05-05 LAB — VITAMIN B12: Vitamin B-12: 218 pg/mL (ref 211–911)

## 2024-05-05 LAB — HEMOGLOBIN A1C: Hgb A1c MFr Bld: 5.3 % (ref 4.6–6.5)

## 2024-05-06 LAB — THYROID PANEL WITH TSH
Free Thyroxine Index: 2 (ref 1.4–3.8)
T3 Uptake: 26 % (ref 22–35)
T4, Total: 7.6 ug/dL (ref 5.1–11.9)
TSH: 2.31 m[IU]/L
